# Patient Record
Sex: Male | Born: 1940 | Race: White | Hispanic: No | Smoking: Former smoker
Health system: Southern US, Community
[De-identification: ages and names within clinical notes are randomized; demographics above are authoritative.]

## PROBLEM LIST (undated history)

## (undated) DIAGNOSIS — T4145XA Adverse effect of unspecified anesthetic, initial encounter: Secondary | ICD-10-CM

## (undated) DIAGNOSIS — N402 Nodular prostate without lower urinary tract symptoms: Secondary | ICD-10-CM

## (undated) DIAGNOSIS — J45909 Unspecified asthma, uncomplicated: Secondary | ICD-10-CM

## (undated) DIAGNOSIS — N400 Enlarged prostate without lower urinary tract symptoms: Secondary | ICD-10-CM

## (undated) DIAGNOSIS — I959 Hypotension, unspecified: Secondary | ICD-10-CM

## (undated) DIAGNOSIS — T8859XA Other complications of anesthesia, initial encounter: Secondary | ICD-10-CM

## (undated) DIAGNOSIS — Z9981 Dependence on supplemental oxygen: Secondary | ICD-10-CM

## (undated) DIAGNOSIS — M1990 Unspecified osteoarthritis, unspecified site: Secondary | ICD-10-CM

## (undated) DIAGNOSIS — J449 Chronic obstructive pulmonary disease, unspecified: Secondary | ICD-10-CM

## (undated) DIAGNOSIS — R0602 Shortness of breath: Secondary | ICD-10-CM

## (undated) DIAGNOSIS — I4891 Unspecified atrial fibrillation: Principal | ICD-10-CM

## (undated) DIAGNOSIS — I10 Essential (primary) hypertension: Secondary | ICD-10-CM

## (undated) DIAGNOSIS — G5730 Lesion of lateral popliteal nerve, unspecified lower limb: Secondary | ICD-10-CM

## (undated) DIAGNOSIS — G709 Myoneural disorder, unspecified: Secondary | ICD-10-CM

## (undated) HISTORY — DX: Chronic obstructive pulmonary disease, unspecified: J44.9

## (undated) HISTORY — DX: Nodular prostate without lower urinary tract symptoms: N40.2

## (undated) HISTORY — DX: Unspecified atrial fibrillation: I48.91

## (undated) HISTORY — DX: Unspecified asthma, uncomplicated: J45.909

## (undated) HISTORY — DX: Benign prostatic hyperplasia without lower urinary tract symptoms: N40.0

## (undated) HISTORY — DX: Essential (primary) hypertension: I10

## (undated) HISTORY — PX: TONSILLECTOMY: SHX5217

## (undated) HISTORY — DX: Hypotension, unspecified: I95.9

## (undated) HISTORY — PX: COLONOSCOPY: SHX174

---

## 2004-10-18 ENCOUNTER — Ambulatory Visit: Payer: Self-pay | Admitting: Internal Medicine

## 2005-07-10 ENCOUNTER — Encounter: Payer: Self-pay | Admitting: *Deleted

## 2005-07-11 ENCOUNTER — Ambulatory Visit: Payer: Self-pay | Admitting: Internal Medicine

## 2005-07-11 ENCOUNTER — Inpatient Hospital Stay (HOSPITAL_COMMUNITY): Admission: AD | Admit: 2005-07-11 | Discharge: 2005-07-18 | Payer: Self-pay | Admitting: Internal Medicine

## 2005-07-17 ENCOUNTER — Encounter: Payer: Self-pay | Admitting: Cardiology

## 2005-07-17 ENCOUNTER — Ambulatory Visit: Payer: Self-pay | Admitting: Cardiology

## 2005-07-17 ENCOUNTER — Ambulatory Visit: Payer: Self-pay | Admitting: Internal Medicine

## 2005-07-20 ENCOUNTER — Ambulatory Visit: Payer: Self-pay | Admitting: Cardiology

## 2005-07-23 ENCOUNTER — Ambulatory Visit: Payer: Self-pay | Admitting: Internal Medicine

## 2005-07-27 ENCOUNTER — Ambulatory Visit: Payer: Self-pay | Admitting: Cardiology

## 2005-08-02 ENCOUNTER — Ambulatory Visit: Payer: Self-pay | Admitting: Internal Medicine

## 2005-08-10 ENCOUNTER — Ambulatory Visit: Payer: Self-pay | Admitting: Internal Medicine

## 2005-08-17 ENCOUNTER — Ambulatory Visit: Payer: Self-pay | Admitting: Cardiovascular Disease

## 2005-08-24 ENCOUNTER — Ambulatory Visit: Payer: Self-pay | Admitting: Cardiology

## 2005-09-03 ENCOUNTER — Ambulatory Visit: Payer: Self-pay | Admitting: Cardiology

## 2005-09-17 LAB — HM COLONOSCOPY

## 2005-10-01 ENCOUNTER — Ambulatory Visit: Payer: Self-pay | Admitting: Cardiology

## 2005-10-08 ENCOUNTER — Ambulatory Visit: Payer: Self-pay | Admitting: Internal Medicine

## 2005-10-15 ENCOUNTER — Ambulatory Visit: Payer: Self-pay | Admitting: Cardiology

## 2005-10-22 ENCOUNTER — Ambulatory Visit: Payer: Self-pay | Admitting: Internal Medicine

## 2005-10-24 ENCOUNTER — Ambulatory Visit: Payer: Self-pay | Admitting: Internal Medicine

## 2005-11-09 ENCOUNTER — Ambulatory Visit: Payer: Self-pay | Admitting: Internal Medicine

## 2005-11-23 ENCOUNTER — Ambulatory Visit: Payer: Self-pay | Admitting: Internal Medicine

## 2005-12-07 ENCOUNTER — Ambulatory Visit: Payer: Self-pay | Admitting: Cardiovascular Disease

## 2005-12-24 ENCOUNTER — Ambulatory Visit: Payer: Self-pay | Admitting: Cardiology

## 2006-01-07 ENCOUNTER — Ambulatory Visit: Payer: Self-pay | Admitting: Internal Medicine

## 2006-01-23 ENCOUNTER — Ambulatory Visit: Payer: Self-pay | Admitting: Internal Medicine

## 2006-02-04 ENCOUNTER — Ambulatory Visit: Payer: Self-pay | Admitting: Internal Medicine

## 2006-03-04 ENCOUNTER — Ambulatory Visit: Payer: Self-pay | Admitting: Cardiology

## 2006-04-01 ENCOUNTER — Ambulatory Visit: Payer: Self-pay | Admitting: Cardiology

## 2006-04-29 ENCOUNTER — Ambulatory Visit: Payer: Self-pay | Admitting: Cardiovascular Disease

## 2006-05-27 ENCOUNTER — Ambulatory Visit: Payer: Self-pay | Admitting: *Deleted

## 2006-05-29 ENCOUNTER — Ambulatory Visit: Payer: Self-pay | Admitting: Internal Medicine

## 2006-06-12 ENCOUNTER — Ambulatory Visit: Payer: Self-pay | Admitting: Internal Medicine

## 2006-06-24 ENCOUNTER — Ambulatory Visit: Payer: Self-pay | Admitting: Cardiovascular Disease

## 2006-08-22 ENCOUNTER — Ambulatory Visit: Payer: Self-pay | Admitting: Cardiovascular Disease

## 2006-09-23 ENCOUNTER — Ambulatory Visit: Payer: Self-pay | Admitting: Cardiology

## 2006-09-24 ENCOUNTER — Ambulatory Visit: Payer: Self-pay | Admitting: Internal Medicine

## 2007-01-22 ENCOUNTER — Ambulatory Visit: Payer: Self-pay | Admitting: Internal Medicine

## 2007-01-22 LAB — CONVERTED CEMR LAB
ALT: 19 units/L (ref 0–40)
AST: 20 units/L (ref 0–37)
Albumin: 4.1 g/dL (ref 3.5–5.2)
Alkaline Phosphatase: 59 units/L (ref 39–117)
BUN: 20 mg/dL (ref 6–23)
Basophils Absolute: 0 10*3/uL (ref 0.0–0.1)
Basophils Relative: 0.2 % (ref 0.0–1.0)
Bilirubin, Direct: 0.1 mg/dL (ref 0.0–0.3)
CO2: 28 meq/L (ref 19–32)
Calcium: 9.5 mg/dL (ref 8.4–10.5)
Chloride: 108 meq/L (ref 96–112)
Cholesterol: 202 mg/dL (ref 0–200)
Creatinine, Ser: 1.5 mg/dL (ref 0.4–1.5)
Direct LDL: 109.2 mg/dL
Eosinophils Absolute: 0.2 10*3/uL (ref 0.0–0.6)
Eosinophils Relative: 1.5 % (ref 0.0–5.0)
GFR calc Af Amer: 60 mL/min
GFR calc non Af Amer: 50 mL/min
Glucose, Bld: 107 mg/dL — ABNORMAL HIGH (ref 70–99)
HCT: 47.8 % (ref 39.0–52.0)
HDL: 63.2 mg/dL (ref >39.0)
Hemoglobin: 15.9 g/dL (ref 13.0–17.0)
Lymphocytes Relative: 16 % (ref 12.0–46.0)
MCHC: 33.2 g/dL (ref 30.0–36.0)
MCV: 98.1 fL (ref 78.0–100.0)
Monocytes Absolute: 1.2 10*3/uL — ABNORMAL HIGH (ref 0.2–0.7)
Monocytes Relative: 9.9 % (ref 3.0–11.0)
Neutro Abs: 8.6 10*3/uL — ABNORMAL HIGH (ref 1.4–7.7)
Neutrophils Relative %: 72.4 % (ref 43.0–77.0)
PSA: 1.51 ng/mL (ref 0.10–4.00)
Platelets: 390 10*3/uL (ref 150–400)
Potassium: 4.7 meq/L (ref 3.5–5.1)
RBC: 4.87 M/uL (ref 4.22–5.81)
RDW: 13.1 % (ref 11.5–14.6)
Sodium: 142 meq/L (ref 135–145)
Total Bilirubin: 1.2 mg/dL (ref 0.3–1.2)
Total CHOL/HDL Ratio: 3.2
Total Protein: 6.7 g/dL (ref 6.0–8.3)
Triglycerides: 133 mg/dL (ref 0–149)
VLDL: 27 mg/dL (ref 0–40)
WBC: 11.9 10*3/uL — ABNORMAL HIGH (ref 4.5–10.5)

## 2007-04-22 DIAGNOSIS — J45909 Unspecified asthma, uncomplicated: Secondary | ICD-10-CM

## 2007-04-22 DIAGNOSIS — I10 Essential (primary) hypertension: Secondary | ICD-10-CM

## 2007-04-22 HISTORY — DX: Essential (primary) hypertension: I10

## 2007-04-22 HISTORY — DX: Unspecified asthma, uncomplicated: J45.909

## 2007-05-20 ENCOUNTER — Ambulatory Visit: Payer: Self-pay | Admitting: Internal Medicine

## 2007-05-20 LAB — CONVERTED CEMR LAB
ALT: 29 units/L (ref 0–53)
AST: 32 units/L (ref 0–37)
Albumin: 3.8 g/dL (ref 3.5–5.2)
Alkaline Phosphatase: 66 units/L (ref 39–117)
BUN: 17 mg/dL (ref 6–23)
Basophils Absolute: 0 10*3/uL (ref 0.0–0.1)
Basophils Relative: 0.1 % (ref 0.0–1.0)
Bilirubin Urine: NEGATIVE
Bilirubin, Direct: 0.1 mg/dL (ref 0.0–0.3)
Blood in Urine, dipstick: NEGATIVE
CO2: 27 meq/L (ref 19–32)
Calcium: 9.4 mg/dL (ref 8.4–10.5)
Chloride: 103 meq/L (ref 96–112)
Cholesterol: 188 mg/dL (ref 0–200)
Creatinine, Ser: 1.6 mg/dL — ABNORMAL HIGH (ref 0.4–1.5)
Eosinophils Absolute: 0.2 10*3/uL (ref 0.0–0.6)
Eosinophils Relative: 1.9 % (ref 0.0–5.0)
GFR calc Af Amer: 56 mL/min
GFR calc non Af Amer: 46 mL/min
Glucose, Bld: 126 mg/dL — ABNORMAL HIGH (ref 70–99)
Glucose, Urine, Semiquant: NEGATIVE
HCT: 46 % (ref 39.0–52.0)
HDL: 46.2 mg/dL (ref >39.0)
Hemoglobin: 15.9 g/dL (ref 13.0–17.0)
Ketones, urine, test strip: NEGATIVE
LDL Cholesterol: 121 mg/dL — ABNORMAL HIGH (ref 0–99)
Lymphocytes Relative: 14.8 % (ref 12.0–46.0)
MCHC: 34.7 g/dL (ref 30.0–36.0)
MCV: 99 fL (ref 78.0–100.0)
Monocytes Absolute: 1.1 10*3/uL — ABNORMAL HIGH (ref 0.2–0.7)
Monocytes Relative: 9.1 % (ref 3.0–11.0)
Neutro Abs: 8.9 10*3/uL — ABNORMAL HIGH (ref 1.4–7.7)
Neutrophils Relative %: 74.1 % (ref 43.0–77.0)
Nitrite: NEGATIVE
PSA: 1.47 ng/mL (ref 0.10–4.00)
Platelets: 392 10*3/uL (ref 150–400)
Potassium: 4.5 meq/L (ref 3.5–5.1)
Protein, U semiquant: NEGATIVE
RBC: 4.65 M/uL (ref 4.22–5.81)
RDW: 12.7 % (ref 11.5–14.6)
Sodium: 139 meq/L (ref 135–145)
Specific Gravity, Urine: 1.02
TSH: 1.15 microintl units/mL (ref 0.35–5.50)
Total Bilirubin: 0.8 mg/dL (ref 0.3–1.2)
Total CHOL/HDL Ratio: 4.1
Total Protein: 6.6 g/dL (ref 6.0–8.3)
Triglycerides: 102 mg/dL (ref 0–149)
Urobilinogen, UA: 0.2
VLDL: 20 mg/dL (ref 0–40)
WBC Urine, dipstick: NEGATIVE
WBC: 12 10*3/uL — ABNORMAL HIGH (ref 4.5–10.5)
pH: 5.5

## 2007-05-28 ENCOUNTER — Ambulatory Visit: Payer: Self-pay | Admitting: Internal Medicine

## 2007-05-28 DIAGNOSIS — J4489 Other specified chronic obstructive pulmonary disease: Secondary | ICD-10-CM

## 2007-05-28 DIAGNOSIS — N402 Nodular prostate without lower urinary tract symptoms: Secondary | ICD-10-CM | POA: Insufficient documentation

## 2007-05-28 DIAGNOSIS — I4891 Unspecified atrial fibrillation: Secondary | ICD-10-CM

## 2007-05-28 DIAGNOSIS — J449 Chronic obstructive pulmonary disease, unspecified: Secondary | ICD-10-CM

## 2007-05-28 HISTORY — DX: Unspecified atrial fibrillation: I48.91

## 2007-05-28 HISTORY — DX: Other specified chronic obstructive pulmonary disease: J44.89

## 2007-05-28 HISTORY — DX: Nodular prostate without lower urinary tract symptoms: N40.2

## 2007-05-28 HISTORY — DX: Chronic obstructive pulmonary disease, unspecified: J44.9

## 2007-05-29 DIAGNOSIS — I480 Paroxysmal atrial fibrillation: Secondary | ICD-10-CM

## 2007-06-04 ENCOUNTER — Telehealth (INDEPENDENT_AMBULATORY_CARE_PROVIDER_SITE_OTHER): Payer: Self-pay | Admitting: *Deleted

## 2007-06-19 ENCOUNTER — Ambulatory Visit: Payer: Self-pay | Admitting: Gastroenterology

## 2007-07-03 ENCOUNTER — Ambulatory Visit: Payer: Self-pay | Admitting: Gastroenterology

## 2007-07-03 ENCOUNTER — Encounter: Payer: Self-pay | Admitting: Gastroenterology

## 2007-07-03 ENCOUNTER — Encounter: Payer: Self-pay | Admitting: Internal Medicine

## 2007-11-27 ENCOUNTER — Ambulatory Visit: Payer: Self-pay | Admitting: Internal Medicine

## 2008-03-02 ENCOUNTER — Ambulatory Visit: Payer: Self-pay | Admitting: Internal Medicine

## 2008-04-20 ENCOUNTER — Ambulatory Visit: Payer: Self-pay | Admitting: Internal Medicine

## 2008-04-20 DIAGNOSIS — N400 Enlarged prostate without lower urinary tract symptoms: Secondary | ICD-10-CM

## 2008-04-20 HISTORY — DX: Benign prostatic hyperplasia without lower urinary tract symptoms: N40.0

## 2008-06-18 ENCOUNTER — Ambulatory Visit: Payer: Self-pay | Admitting: Internal Medicine

## 2008-06-18 LAB — CONVERTED CEMR LAB
ALT: 16 units/L (ref 0–53)
AST: 17 units/L (ref 0–37)
Albumin: 3.9 g/dL (ref 3.5–5.2)
Alkaline Phosphatase: 67 units/L (ref 39–117)
BUN: 19 mg/dL (ref 6–23)
Basophils Absolute: 0 10*3/uL (ref 0.0–0.1)
Basophils Relative: 0.1 % (ref 0.0–3.0)
Bilirubin Urine: NEGATIVE
Bilirubin, Direct: 0.2 mg/dL (ref 0.0–0.3)
Blood in Urine, dipstick: NEGATIVE
CO2: 26 meq/L (ref 19–32)
Calcium: 9.1 mg/dL (ref 8.4–10.5)
Chloride: 101 meq/L (ref 96–112)
Cholesterol: 200 mg/dL (ref 0–200)
Creatinine, Ser: 1.5 mg/dL (ref 0.4–1.5)
Eosinophils Absolute: 0.1 10*3/uL (ref 0.0–0.7)
Eosinophils Relative: 1.5 % (ref 0.0–5.0)
GFR calc Af Amer: 60 mL/min
GFR calc non Af Amer: 49 mL/min
Glucose, Bld: 103 mg/dL — ABNORMAL HIGH (ref 70–99)
Glucose, Urine, Semiquant: NEGATIVE
HCT: 47.4 % (ref 39.0–52.0)
HDL: 93.7 mg/dL (ref >39.0)
Hemoglobin: 15.9 g/dL (ref 13.0–17.0)
Ketones, urine, test strip: NEGATIVE
LDL Cholesterol: 95 mg/dL (ref 0–99)
Lymphocytes Relative: 15.2 % (ref 12.0–46.0)
MCHC: 33.5 g/dL (ref 30.0–36.0)
MCV: 98.9 fL (ref 78.0–100.0)
Monocytes Absolute: 0.9 10*3/uL (ref 0.1–1.0)
Monocytes Relative: 9.4 % (ref 3.0–12.0)
Neutro Abs: 6.8 10*3/uL (ref 1.4–7.7)
Neutrophils Relative %: 73.8 % (ref 43.0–77.0)
Nitrite: NEGATIVE
PSA: 1.37 ng/mL (ref 0.10–4.00)
Platelets: 351 10*3/uL (ref 150–400)
Potassium: 4.5 meq/L (ref 3.5–5.1)
Protein, U semiquant: NEGATIVE
RBC: 4.79 M/uL (ref 4.22–5.81)
RDW: 13.3 % (ref 11.5–14.6)
Sodium: 136 meq/L (ref 135–145)
Specific Gravity, Urine: 1.015
TSH: 1.91 microintl units/mL (ref 0.35–5.50)
Total Bilirubin: 0.8 mg/dL (ref 0.3–1.2)
Total CHOL/HDL Ratio: 2.1
Total Protein: 6.7 g/dL (ref 6.0–8.3)
Triglycerides: 56 mg/dL (ref 0–149)
Urobilinogen, UA: 0.2
VLDL: 11 mg/dL (ref 0–40)
WBC Urine, dipstick: NEGATIVE
WBC: 9.2 10*3/uL (ref 4.5–10.5)
pH: 5.5

## 2008-06-24 ENCOUNTER — Encounter: Payer: Self-pay | Admitting: Internal Medicine

## 2008-06-25 ENCOUNTER — Ambulatory Visit: Payer: Self-pay | Admitting: Internal Medicine

## 2008-12-15 ENCOUNTER — Ambulatory Visit: Payer: Self-pay | Admitting: Internal Medicine

## 2009-01-18 ENCOUNTER — Telehealth: Payer: Self-pay | Admitting: Internal Medicine

## 2009-06-15 ENCOUNTER — Ambulatory Visit: Payer: Self-pay | Admitting: Internal Medicine

## 2009-10-12 ENCOUNTER — Ambulatory Visit: Payer: Self-pay | Admitting: Internal Medicine

## 2009-10-13 ENCOUNTER — Encounter: Payer: Self-pay | Admitting: Internal Medicine

## 2009-11-24 ENCOUNTER — Telehealth: Payer: Self-pay | Admitting: Internal Medicine

## 2010-02-08 ENCOUNTER — Ambulatory Visit: Payer: Self-pay | Admitting: Internal Medicine

## 2010-08-08 ENCOUNTER — Ambulatory Visit: Payer: Self-pay | Admitting: Internal Medicine

## 2010-09-23 ENCOUNTER — Emergency Department (HOSPITAL_COMMUNITY)
Admission: EM | Admit: 2010-09-23 | Discharge: 2010-09-23 | Payer: Self-pay | Source: Home / Self Care | Admitting: Emergency Medicine

## 2010-09-26 ENCOUNTER — Ambulatory Visit
Admission: RE | Admit: 2010-09-26 | Discharge: 2010-09-26 | Payer: Self-pay | Source: Home / Self Care | Attending: Internal Medicine | Admitting: Internal Medicine

## 2010-10-02 LAB — DIFFERENTIAL
Basophils Absolute: 0 10*3/uL (ref 0.0–0.1)
Basophils Relative: 0 % (ref 0–1)
Eosinophils Absolute: 0.1 10*3/uL (ref 0.0–0.7)
Eosinophils Relative: 0 % (ref 0–5)
Lymphocytes Relative: 3 % — ABNORMAL LOW (ref 12–46)
Lymphs Abs: 0.6 10*3/uL — ABNORMAL LOW (ref 0.7–4.0)
Monocytes Absolute: 1.9 10*3/uL — ABNORMAL HIGH (ref 0.1–1.0)
Monocytes Relative: 11 % (ref 3–12)
Neutro Abs: 15.4 10*3/uL — ABNORMAL HIGH (ref 1.7–7.7)
Neutrophils Relative %: 86 % — ABNORMAL HIGH (ref 43–77)

## 2010-10-02 LAB — BASIC METABOLIC PANEL
BUN: 19 mg/dL (ref 6–23)
CO2: 24 mEq/L (ref 19–32)
Calcium: 9.6 mg/dL (ref 8.4–10.5)
Chloride: 103 mEq/L (ref 96–112)
Creatinine, Ser: 1.92 mg/dL — ABNORMAL HIGH (ref 0.4–1.5)
GFR calc Af Amer: 42 mL/min — ABNORMAL LOW (ref >60)
GFR calc non Af Amer: 35 mL/min — ABNORMAL LOW (ref >60)
Glucose, Bld: 123 mg/dL — ABNORMAL HIGH (ref 70–99)
Potassium: 5 mEq/L (ref 3.5–5.1)
Sodium: 138 mEq/L (ref 135–145)

## 2010-10-02 LAB — BLOOD GAS, ARTERIAL
Acid-base deficit: 3.9 mmol/L — ABNORMAL HIGH (ref 0.0–2.0)
Bicarbonate: 19.8 mEq/L — ABNORMAL LOW (ref 20.0–24.0)
Drawn by: 307971
FIO2: 0.21 %
O2 Saturation: 93.1 %
Patient temperature: 98.6
TCO2: 17.1 mmol/L (ref 0–100)
pCO2 arterial: 34.2 mmHg — ABNORMAL LOW (ref 35.0–45.0)
pH, Arterial: 7.381 (ref 7.350–7.450)
pO2, Arterial: 64.3 mmHg — ABNORMAL LOW (ref 80.0–100.0)

## 2010-10-02 LAB — CBC
HCT: 48.4 % (ref 39.0–52.0)
Hemoglobin: 16.3 g/dL (ref 13.0–17.0)
MCH: 33.3 pg (ref 26.0–34.0)
MCHC: 33.7 g/dL (ref 30.0–36.0)
MCV: 99 fL (ref 78.0–100.0)
Platelets: 368 10*3/uL (ref 150–400)
RBC: 4.89 MIL/uL (ref 4.22–5.81)
RDW: 13 % (ref 11.5–15.5)
WBC: 17.9 10*3/uL — ABNORMAL HIGH (ref 4.0–10.5)

## 2010-10-02 LAB — POCT CARDIAC MARKERS
CKMB, poc: 1.9 ng/mL (ref 1.0–8.0)
Myoglobin, poc: 308 ng/mL (ref 12–200)
Troponin i, poc: <0.05 ng/mL (ref 0.00–0.09)

## 2010-10-02 LAB — BRAIN NATRIURETIC PEPTIDE: Pro B Natriuretic peptide (BNP): <30 pg/mL (ref 0.0–100.0)

## 2010-10-10 ENCOUNTER — Encounter: Payer: Self-pay | Admitting: Internal Medicine

## 2010-10-10 ENCOUNTER — Ambulatory Visit
Admission: RE | Admit: 2010-10-10 | Discharge: 2010-10-10 | Payer: Self-pay | Source: Home / Self Care | Attending: Internal Medicine | Admitting: Internal Medicine

## 2010-10-10 DIAGNOSIS — I959 Hypotension, unspecified: Secondary | ICD-10-CM

## 2010-10-10 HISTORY — DX: Hypotension, unspecified: I95.9

## 2010-10-13 ENCOUNTER — Ambulatory Visit
Admission: RE | Admit: 2010-10-13 | Discharge: 2010-10-13 | Payer: Self-pay | Source: Home / Self Care | Attending: Internal Medicine | Admitting: Internal Medicine

## 2010-10-15 LAB — CONVERTED CEMR LAB
ALT: 20 units/L (ref 0–53)
AST: 23 units/L (ref 0–37)
Albumin: 4 g/dL (ref 3.5–5.2)
Alkaline Phosphatase: 51 units/L (ref 39–117)
BUN: 21 mg/dL (ref 6–23)
Basophils Absolute: 0 10*3/uL (ref 0.0–0.1)
Basophils Relative: 0.3 % (ref 0.0–3.0)
Bilirubin, Direct: 0.1 mg/dL (ref 0.0–0.3)
CO2: 27 meq/L (ref 19–32)
Calcium: 9.5 mg/dL (ref 8.4–10.5)
Chloride: 103 meq/L (ref 96–112)
Cholesterol: 195 mg/dL (ref 0–200)
Creatinine, Ser: 1.6 mg/dL — ABNORMAL HIGH (ref 0.4–1.5)
Eosinophils Absolute: 0.3 10*3/uL (ref 0.0–0.7)
Eosinophils Relative: 3.3 % (ref 0.0–5.0)
GFR calc non Af Amer: 45.71 mL/min (ref >60)
Glucose, Bld: 113 mg/dL — ABNORMAL HIGH (ref 70–99)
HCT: 47 % (ref 39.0–52.0)
HDL: 104.2 mg/dL (ref >39.00)
Hemoglobin: 15.4 g/dL (ref 13.0–17.0)
LDL Cholesterol: 81 mg/dL (ref 0–99)
Lymphocytes Relative: 15.3 % (ref 12.0–46.0)
Lymphs Abs: 1.4 10*3/uL (ref 0.7–4.0)
MCHC: 32.7 g/dL (ref 30.0–36.0)
MCV: 100.6 fL — ABNORMAL HIGH (ref 78.0–100.0)
Monocytes Absolute: 1 10*3/uL (ref 0.1–1.0)
Monocytes Relative: 11.3 % (ref 3.0–12.0)
Neutro Abs: 6.3 10*3/uL (ref 1.4–7.7)
Neutrophils Relative %: 69.8 % (ref 43.0–77.0)
PSA: 2.04 ng/mL (ref 0.10–4.00)
Platelets: 336 10*3/uL (ref 150.0–400.0)
Potassium: 4.5 meq/L (ref 3.5–5.1)
RBC: 4.68 M/uL (ref 4.22–5.81)
RDW: 13.7 % (ref 11.5–14.6)
Sodium: 140 meq/L (ref 135–145)
TSH: 1.98 microintl units/mL (ref 0.35–5.50)
Total Bilirubin: 0.8 mg/dL (ref 0.3–1.2)
Total CHOL/HDL Ratio: 2
Total Protein: 6.8 g/dL (ref 6.0–8.3)
Triglycerides: 48 mg/dL (ref 0.0–149.0)
VLDL: 9.6 mg/dL (ref 0.0–40.0)
WBC: 9 10*3/uL (ref 4.5–10.5)

## 2010-10-17 NOTE — Assessment & Plan Note (Signed)
Summary: EMP-WILL FAST//CCM   Vital Signs:  Patient profile:   70 year old male Weight:      140 pounds BMI:     24.12 O2 Sat:      91 % on Room air Pulse rate:   96 / minute Pulse rhythm:   regular BP sitting:   120 / 76  (left arm) Cuff size:   regular  Vitals Entered By: Raechel Ache, RN (October 12, 2009 8:43 AM)  O2 Flow:  Room air CC: OV, fasting. C/o SOB, congestion.   CC:  OV, fasting. C/o SOB, and congestion.Marland Kitchen  History of Present Illness: 70 year old patient who is seen today for a comprehensive evaluation.  he has a history of advanced COPD, which has been fairly stable.  For the past few days, he has noticed an increasing cough and congestion is slightly worsened dyspnea on exertion.  He has treated hypertension, history of BPH and also a history of paroxysmal atrial fibrillation. He is on maintenance Advair, as well as home nebulizer treatments with Xopenex.  No new concerns or complaints.  In general, his pulmonary status has been stable  Allergies: 1)  ! Penicillin V Potassium (Penicillin V Potassium)  Past History:  Past Medical History: Reviewed history from 04/20/2008 and no changes required. Asthma Hypertension COPD Atrial fibrillation Benign prostatic hypertrophy  Past Surgical History: Reviewed history from 06/25/2008 and no changes required. Tonsillectomy sigmoidoscopy 2002 colonoscopy 2007  Family History: Reviewed history from 05/28/2007 and no changes required. father died age 61 mother died age 50 apparently, an autoimmune  disorder one sister, health unknown  Social History: Reviewed history from 05/28/2007 and no changes required. wife deceased from colon cancer  Review of Systems       The patient complains of dyspnea on exertion.  The patient denies anorexia, fever, weight loss, weight gain, vision loss, decreased hearing, hoarseness, chest pain, syncope, peripheral edema, prolonged cough, headaches, hemoptysis, abdominal pain,  melena, hematochezia, severe indigestion/heartburn, hematuria, incontinence, genital sores, muscle weakness, suspicious skin lesions, transient blindness, difficulty walking, depression, unusual weight change, abnormal bleeding, enlarged lymph nodes, angioedema, breast masses, and testicular masses.    Physical Exam  General:  Well-developed,well-nourished,in no acute distress; alert,appropriate and cooperative throughout examination; 120/72 Head:  Normocephalic and atraumatic without obvious abnormalities. No apparent alopecia or balding. Eyes:  No corneal or conjunctival inflammation noted. EOMI. Perrla. Funduscopic exam benign, without hemorrhages, exudates or papilledema. Vision grossly normal. Ears:  External ear exam shows no significant lesions or deformities.  Otoscopic examination reveals clear canals, tympanic membranes are intact bilaterally without bulging, retraction, inflammation or discharge. Hearing is grossly normal bilaterally. Nose:  External nasal examination shows no deformity or inflammation. Nasal mucosa are pink and moist without lesions or exudates. Mouth:  Oral mucosa and oropharynx without lesions or exudates.  Teeth in good repair. Neck:  No deformities, masses, or tenderness noted. Chest Wall:  No deformities, masses, tenderness or gynecomastia noted. Breasts:  No masses or gynecomastia noted Lungs:  normal respiratory effort, no intercostal retractions, and no accessory muscle use.   scattered rhonchi, few  faint  wheezesnormal respiratory effort, no intercostal retractions, and no accessory muscle use.   Heart:  Normal rate and regular rhythm. S1 and S2 normal without gallop, murmur, click, rub or other extra sounds. Abdomen:  Bowel sounds positive,abdomen soft and non-tender without masses, organomegaly or hernias noted. Rectal:  No external abnormalities noted. Normal sphincter tone. No rectal masses or tenderness. Genitalia:  uncircumcised.  right inguinal  herniauncircumcised.   Prostate:  2+ enlarged.  2+ enlarged.   Msk:  No deformity or scoliosis noted of thoracic or lumbar spine.   Pulses:  pedal pulses decreased on the left.  No ischemic changes. Extremities:  No clubbing, cyanosis, edema, or deformity noted with normal full range of motion of all joints.   Neurologic:  alert & oriented X3, cranial nerves II-XII intact, strength normal in all extremities, sensation intact to light touch, gait normal, and DTRs symmetrical and normal.  alert & oriented X3, cranial nerves II-XII intact, strength normal in all extremities, sensation intact to light touch, gait normal, and DTRs symmetrical and normal.   Skin:  Intact without suspicious lesions or rashes Cervical Nodes:  No lymphadenopathy noted Axillary Nodes:  No palpable lymphadenopathy Inguinal Nodes:  No significant adenopathy Psych:  Cognition and judgment appear intact. Alert and cooperative with normal attention span and concentration. No apparent delusions, illusions, hallucinations   Impression & Recommendations:  Problem # 1:  BENIGN PROSTATIC HYPERTROPHY (ICD-600.00)  Orders: Venipuncture (47829) TLB-Lipid Panel (80061-LIPID) TLB-BMP (Basic Metabolic Panel-BMET) (80048-METABOL) TLB-CBC Platelet - w/Differential (85025-CBCD) TLB-Hepatic/Liver Function Pnl (80076-HEPATIC) TLB-TSH (Thyroid Stimulating Hormone) (84443-TSH) TLB-PSA (Prostate Specific Antigen) (84153-PSA)  Problem # 2:  ATRIAL FIBRILLATION (ICD-427.31)  His updated medication list for this problem includes:    Diltiazem Hcl Er Beads 240 Mg Cp24 (Diltiazem hcl er beads) ..... One  capsule by mouth once a day  Orders: EKG w/ Interpretation (93000)  His updated medication list for this problem includes:    Diltiazem Hcl Er Beads 240 Mg Cp24 (Diltiazem hcl er beads) .Marland Kitchen... 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 One  capsule by mouth once a day  Orders: EKG w/ Interpretation (93000) Venipuncture (56213) TLB-Lipid Panel (80061-LIPID) TLB-BMP (Basic Metabolic Panel-BMET) (80048-METABOL) TLB-CBC Platelet - w/Differential (85025-CBCD) TLB-Hepatic/Liver Function Pnl (80076-HEPATIC) TLB-TSH (Thyroid Stimulating Hormone) (84443-TSH)  His updated medication list for this problem includes:    Diltiazem Hcl Er Beads 240 Mg Cp24 (Diltiazem hcl er beads) .Marland Kitchen... Take 2 capsule by mouth once a day  Problem # 5:  HYPERTENSION (ICD-401.9)  His updated medication list for this problem includes:    Zestril 20 Mg Tabs (Lisinopril) .Marland Kitchen... Take 1 tablet by mouth once a day    Diltiazem Hcl Er Beads 240 Mg Cp24 (Diltiazem hcl er beads) ..... One  capsule by mouth once a day    Hydrochlorothiazide 25 Mg Tabs (Hydrochlorothiazide) .Marland Kitchen... Take 1 tablet by mouth once a day  His updated medication list for this problem includes:    Zestril 20 Mg Tabs (Lisinopril) .Marland Kitchen... Take 1 tablet by mouth once a day    Diltiazem Hcl Er Beads 240 Mg Cp24 (Diltiazem hcl er beads) .Marland Kitchen... Take 2 capsule by mouth once a day    Hydrochlorothiazide 25 Mg Tabs (Hydrochlorothiazide) .Marland Kitchen... Take 1 tablet by mouth once a day  Orders: Venipuncture (08657) TLB-Lipid Panel (80061-LIPID) TLB-BMP (Basic Metabolic Panel-BMET) (80048-METABOL) TLB-CBC Platelet - w/Differential (85025-CBCD) TLB-Hepatic/Liver Function Pnl (80076-HEPATIC) TLB-TSH (Thyroid Stimulating Hormone) (84696-EXB) Prescription Created Electronically [phone])  Complete Medication List: 1)  Zestril 20 Mg Tabs (Lisinopril) .... Take 1 tablet by mouth once a day 2)  Multivitamins Caps (Multiple vitamin) .... Once daily 3)  Diltiazem Hcl  Er Beads 240 Mg Cp24 (Diltiazem hcl er beads) .... One  capsule by mouth once a day 4)  Xopenex Hfa  45 Mcg/act Aero (Levalbuterol tartrate) .... Two inhalations every 6 hours 5)  Hydrochlorothiazide 25 Mg Tabs (Hydrochlorothiazide) .... Take 1 tablet by mouth once a day 6)  Advair Diskus 250-50 Mcg/dose Misc (Fluticasone-salmeterol) .... Use twice daily 7)  Xopenex Concentrate 1.25 Mg/0.26ml Nebu (Levalbuterol hcl) .... Use four times daily 8)  Xyzal 5 Mg Tabs (Levocetirizine dihydrochloride) .... One daily. 9)  Prednisone (pak) 5 Mg Tabs (Prednisone) .... As directed for 6 days  Other Orders: DRE (H6073)  Patient Instructions: 1)  Please schedule a follow-up appointment in 4 months. 2)  Limit your Sodium (Salt). 3)  It is important that you exercise regularly at least 20 minutes 5 times a week. If you develop chest pain, have severe difficulty breathing, or feel very tired , stop exercising immediately and seek medical attention. Prescriptions: PREDNISONE (PAK) 5 MG TABS (PREDNISONE) as directed for 6 days  #5 mg 6day x 0   Entered and Authorized by:   Gordy Savers  MD   Signed by:   Gordy Savers  MD on 10/12/2009   Method used:   Print then Give to Patient   RxID:   [id number] XYZAL 5 MG TABS (LEVOCETIRIZINE DIHYDROCHLORIDE) One daily.  #30 x 6   Entered and Authorized by:   Gordy Savers  MD   Signed by:   Gordy Savers  MD on 10/12/2009   Method used:   Print then Give to Patient   RxID:   [id number] CVELFYB CONCENTRATE 1.25 MG/0.5ML  NEBU (LEVALBUTEROL HCL) use four times daily  #120 x 6   Entered and Authorized by:   Gordy Savers  MD   Signed by:   Gordy Savers  MD on 10/12/2009   Method used:   Print then Give to Patient   RxID:   [id number] ADVAIR DISKUS 250-50 MCG/DOSE  MISC (FLUTICASONE-SALMETEROL) use twice daily  #3 x 6   Entered and Authorized by:   Gordy Savers  MD   Signed by:   Gordy Savers  MD on 10/12/2009   Method used:   Print then Give to Patient   RxID:    [id number] HYDROCHLOROTHIAZIDE 25 MG  TABS (HYDROCHLOROTHIAZIDE) Take 1 tablet by mouth once a day  #90 x 6   Entered and Authorized by:   Gordy Savers  MD   Signed by:   Gordy Savers  MD on 10/12/2009   Method used:   Print then Give to Patient   RxID:   [id number] KDXIPJA HFA 45 MCG/ACT  AERO (LEVALBUTEROL TARTRATE) two inhalations every 6 hours  #3 x 6   Entered and Authorized by:   Gordy Savers  MD   Signed by:   Gordy Savers  MD on 10/12/2009   Method used:   Print then Give to Patient   RxID:   [id number] DILTIAZEM HCL ER BEADS 240 MG  CP24 (DILTIAZEM HCL ER BEADS) one  capsule by mouth once a day  #90 x 6   Entered and Authorized by:   Gordy Savers  MD   Signed by:   Gordy Savers  MD on 10/12/2009   Method used:   Print then Give to Patient   RxID:   [id number] ZESTRIL 20 MG  TABS (LISINOPRIL) Take 1 tablet by mouth once a day  #90  x 6   Entered and Authorized by:   Gordy Savers  MD   Signed by:   Gordy Savers  MD on 10/12/2009   Method used:   Print then Give to Patient   RxID:   [id number] PREDNISONE (PAK) 5 MG TABS (PREDNISONE) as directed for 6 days  #5 mg 6day x 0   Entered and Authorized by:   Gordy Savers  MD   Signed by:   Gordy Savers  MD on 10/12/2009   Method used:   Electronically to        [facility]  [address] [facility]* (retail)       [address]       [place], [place]  [address]       Ph: [phone] or [phone]       Fax: [phone]   RxID:   [id number] XYZAL 5 MG TABS (LEVOCETIRIZINE DIHYDROCHLORIDE) One daily.  #30 x 6   Entered and Authorized by:   Gordy Savers  MD   Signed by:   Gordy Savers  MD on 10/12/2009   Method used:   Electronically to        [facility] [address] [facility]* (retail)       [address]       [place], [place]  [address]       Ph: [phone] or [phone]        Fax: [phone]   RxID:   [id number] SEGBTDV CONCENTRATE 1.25 MG/0.5ML  NEBU (LEVALBUTEROL HCL) use four times daily  #120 x 6   Entered and Authorized by:   Gordy Savers  MD   Signed by:   Gordy Savers  MD on 10/12/2009   Method used:   Electronically to        [facility]  [address] [facility]* (retail)       [address]       [place], [place]  [address]       Ph: [phone] or [phone]       Fax: [phone]   RxID:   [id number] ADVAIR DISKUS 250-50 MCG/DOSE  MISC (FLUTICASONE-SALMETEROL) use twice daily  #3 x 6   Entered and Authorized by:   Gordy Savers  MD   Signed by:   Gordy Savers  MD on 10/12/2009   Method used:   Electronically to        [facility]  [address] [facility]* (retail)       [address]       [place], [place]  [address]       Ph: [phone] or [phone]       Fax: [phone]   RxID:   [id number] HYDROCHLOROTHIAZIDE 25 MG  TABS (HYDROCHLOROTHIAZIDE) Take 1 tablet by mouth once a day  #90 x 6   Entered and Authorized by:   Gordy Savers  MD   Signed by:   Gordy Savers  MD on 10/12/2009   Method used:   Electronically to        [facility]  [address] [facility]* (retail)       [address]       [place], [place]  [address]  Ph: [phone] or [phone]       Fax: [phone]   RxID:   [id number] EPPIRJJ HFA 45 MCG/ACT  AERO (LEVALBUTEROL TARTRATE) two inhalations every 6 hours  #3 x 6   Entered and Authorized by:   Gordy Savers  MD   Signed by:   Gordy Savers  MD on 10/12/2009   Method used:   Electronically to        [facility]  [address] [facility] (retail)       [address]       [place], [place]  [address]       Ph: [phone] or [phone]       Fax: [phone]   RxID:   [id number] DILTIAZEM HCL ER BEADS 240 MG  CP24 (DILTIAZEM HCL ER BEADS) one  capsule by mouth once a day   #90 x 6   Entered and Authorized by:   Gordy Savers  MD   Signed by:   Gordy Savers  MD on 10/12/2009   Method used:   Electronically to        [facility]  [address] [facility]* (retail)       [address]       [place], [place]  [address]       Ph: [phone] or [phone]       Fax: [phone]   RxID:   [id number] ZESTRIL 20 MG  TABS (LISINOPRIL) Take 1 tablet by mouth once a day  #90 x 6   Entered and Authorized by:   Gordy Savers  MD   Signed by:   Gordy Savers  MD on 10/12/2009   Method used:   Electronically to        [facility]  [address] [facility]* (retail)       [address]       [place], [place]  [address]       Ph: [phone] or [phone]       Fax: [phone]   RxID:   [id number]

## 2010-10-17 NOTE — Assessment & Plan Note (Signed)
Summary: 4 month rov/njr   Vital Signs:  Patient profile:   70 year old male Weight:      143 pounds BP sitting:   118 / 70  (left arm) Cuff size:   regular  Vitals Entered By: Duard Brady LPN (Feb 08, 2010 8:30 AM) CC: 4 mos rov - doing well Is Patient Diabetic? No   CC:  4 mos rov - doing well.  History of Present Illness: a 70 year old patient who is seen today for follow-up.  He has a history of paroxysmal atrial for ablation hypertension, COPD, with asthma.  He has done quite well.  His pulmonate status has been stable.  He remains on multiple inhalational medications.  Has had a very uneventful spring as far as allergies and asthma.  He denies any cardiopulmonary complaints.  His only concern is some fullness in the right ear.  It sounds like he has a difficult time with equilibration  Preventive Screening-Counseling & Management  Alcohol-Tobacco     Smoking Status: quit  Allergies: 1)  ! Penicillin V Potassium (Penicillin V Potassium)  Past History:  Past Medical History: Reviewed history from 04/20/2008 and no changes required. Asthma Hypertension COPD Atrial fibrillation Benign prostatic hypertrophy  Past Surgical History: Reviewed history from 06/25/2008 and no changes required. Tonsillectomy sigmoidoscopy 2002 colonoscopy 2007  Family History: Reviewed history from 05/28/2007 and no changes required. father died age 37 mother died age 31 apparently, an autoimmune  disorder one sister, health unknown  Review of Systems  The patient denies anorexia, fever, weight loss, weight gain, vision loss, decreased hearing, hoarseness, chest pain, syncope, dyspnea on exertion, peripheral edema, prolonged cough, headaches, hemoptysis, abdominal pain, melena, hematochezia, severe indigestion/heartburn, hematuria, incontinence, genital sores, muscle weakness, suspicious skin lesions, transient blindness, difficulty walking, depression, unusual weight change,  abnormal bleeding, enlarged lymph nodes, angioedema, breast masses, and testicular masses.    Physical Exam  General:  Well-developed,well-nourished,in no acute distress; alert,appropriate and cooperative throughout examination; blood pressure low normal Head:  Normocephalic and atraumatic without obvious abnormalities. No apparent alopecia or balding. Eyes:  No corneal or conjunctival inflammation noted. EOMI. Perrla. Funduscopic exam benign, without hemorrhages, exudates or papilledema. Vision grossly normal. Mouth:  Oral mucosa and oropharynx without lesions or exudates.  Teeth in good repair. Neck:  No deformities, masses, or tenderness noted. Lungs:  Normal respiratory effort, chest expands symmetrically. Lungs are clear to auscultation, no crackles or wheezes. Heart:  Normal rate and regular rhythm. S1 and S2 normal without gallop, murmur, click, rub or other extra sounds. Abdomen:  Bowel sounds positive,abdomen soft and non-tender without masses, organomegaly or hernias noted.   Impression & Recommendations:  Problem # 1:  ATRIAL FIBRILLATION (ICD-427.31)  His updated medication list for this problem includes:    Diltiazem Hcl Er Beads 240 Mg Cp24 (Diltiazem hcl er beads) ..... One  capsule by mouth once a day  His updated medication list for this problem includes:    Diltiazem Hcl Er Beads 240 Mg Cp24 (Diltiazem hcl er beads) ..... One  capsule by mouth once a day  Problem # 2:  COPD (ICD-496)  His updated medication list for this problem includes:    Advair Diskus 250-50 Mcg/dose Misc (Fluticasone-salmeterol) ..... Use twice daily    Xopenex Concentrate 1.25 Mg/0.57ml Nebu (Levalbuterol hcl) ..... Use four times daily    Ventolin Hfa 108 (90 Base) Mcg/act Aers (Albuterol sulfate) .Marland Kitchen... 2 puffs q6h as needed  His updated medication list for this problem includes:  Advair Diskus 250-50 Mcg/dose Misc (Fluticasone-salmeterol) ..... Use twice daily    Xopenex Concentrate 1.25  Mg/0.49ml Nebu (Levalbuterol hcl) ..... Use four times daily    Ventolin Hfa 108 (90 Base) Mcg/act Aers (Albuterol sulfate) .Marland Kitchen... 2 puffs q6h as needed  Problem # 3:  HYPERTENSION (ICD-401.9)  His updated medication list for this problem includes:    Zestril 20 Mg Tabs (Lisinopril) .Marland Kitchen... Take 1 tablet by mouth once a day    Diltiazem Hcl Er Beads 240 Mg Cp24 (Diltiazem hcl er beads) ..... One  capsule by mouth once a day    Hydrochlorothiazide 25 Mg Tabs (Hydrochlorothiazide) .Marland Kitchen... Take 1 tablet by mouth once a day    His updated medication list for this problem includes:    Zestril 20 Mg Tabs (Lisinopril) .Marland Kitchen... Take 1 tablet by mouth once a day    Diltiazem Hcl Er Beads 240 Mg Cp24 (Diltiazem hcl er beads) ..... One  capsule by mouth once a day    Hydrochlorothiazide 25 Mg Tabs (Hydrochlorothiazide) .Marland Kitchen... Take 1 tablet by mouth once a day  Orders: Prescription Created Electronically [phone])  Complete Medication List: 1)  Zestril 20 Mg Tabs (Lisinopril) .... Take 1 tablet by mouth once a day 2)  Multivitamins Caps (Multiple vitamin) .... Once daily 3)  Diltiazem Hcl Er Beads 240 Mg Cp24 (Diltiazem hcl er beads) .... One  capsule by mouth once a day 4)  Hydrochlorothiazide 25 Mg Tabs (Hydrochlorothiazide) .... Take 1 tablet by mouth once a day 5)  Advair Diskus 250-50 Mcg/dose Misc (Fluticasone-salmeterol) .... Use twice daily 6)  Xopenex Concentrate 1.25 Mg/0.69ml Nebu (Levalbuterol hcl) .... Use four times daily 7)  Xyzal 5 Mg Tabs (Levocetirizine dihydrochloride) .... One daily. 8)  Prednisone (pak) 5 Mg Tabs (Prednisone) .... As directed for 6 days 9)  Ventolin Hfa 108 (90 Base) Mcg/act Aers (Albuterol sulfate) .... 2 puffs q6h as needed  Patient Instructions: 1)  Please schedule a follow-up appointment in 6 months. 2)  Limit your Sodium (Salt) to less than 2 grams a day(slightly less than 1/2 a teaspoon) to prevent fluid retention, swelling, or worsening of symptoms. 3)  It is  important that you exercise regularly at least 20 minutes 5 times a week. If you develop chest pain, have severe difficulty breathing, or feel very tired , stop exercising immediately and seek medical attention. Prescriptions: XOPENEX CONCENTRATE 1.25 MG/0.5ML  NEBU (LEVALBUTEROL HCL) use four times daily  #120 x 6   Entered and Authorized by:   Gordy Savers  MD   Signed by:   Gordy Savers  MD on 02/08/2010   Method used:   Electronically to        [facility]  [address] [facility]* (retail)       [address]       [place], [place]  [address]       Ph: [phone] or [phone]       Fax: [phone]   RxID:   [id number] ADVAIR DISKUS 250-50 MCG/DOSE  MISC (FLUTICASONE-SALMETEROL) use twice daily  #3 x 6   Entered and Authorized by:   Gordy Savers  MD   Signed by:   Gordy Savers  MD on 02/08/2010   Method used:   Electronically to        [facility]  [address] [facility] (retail)       [address]  [place], [place]  [address]       Ph: [phone] or [phone]       Fax: [phone]   RxID:   [id number] HYDROCHLOROTHIAZIDE 25 MG  TABS (HYDROCHLOROTHIAZIDE) Take 1 tablet by mouth once a day  #90 x 6   Entered and Authorized by:   Gordy Savers  MD   Signed by:   Gordy Savers  MD on 02/08/2010   Method used:   Electronically to        [facility]  [address] [facility] (retail)       [address]       [place], [place]  [address]       Ph: [phone] or [phone]       Fax: [phone]   RxID:   [id number] DILTIAZEM HCL ER BEADS 240 MG  CP24 (DILTIAZEM HCL ER BEADS) one  capsule by mouth once a day  #90 x 6   Entered and Authorized by:   Gordy Savers  MD   Signed by:   Gordy Savers  MD on 02/08/2010   Method used:   Electronically to        [facility]  [address] [facility] (retail)       [address]       [place], [place]  [address]       Ph: [phone] or [phone]       Fax: [phone]   RxID:   [id number] ZESTRIL 20 MG  TABS (LISINOPRIL) Take 1 tablet by mouth once a day  #90 x 6   Entered and Authorized by:   Gordy Savers  MD   Signed by:   Gordy Savers  MD on 02/08/2010   Method used:   Electronically to        [facility]  [address] [facility] (retail)       [address]       [place], [place]  [address]       Ph: [phone] or [phone]       Fax: [phone]   RxID:   [id number]

## 2010-10-17 NOTE — Miscellaneous (Signed)
  Medications Added VENTOLIN HFA 108 (90 BASE) MCG/ACT AERS (ALBUTEROL SULFATE) 2 puffs q6h as needed       Clinical Lists Changes  Medications: Removed medication of XOPENEX HFA 45 MCG/ACT  AERO (LEVALBUTEROL TARTRATE) two inhalations every 6 hours Added new medication of VENTOLIN HFA 108 (90 BASE) MCG/ACT AERS (ALBUTEROL SULFATE) 2 puffs q6h as needed

## 2010-10-17 NOTE — Progress Notes (Signed)
Summary: Rx  Phone Note Call from Patient Call back at Home Phone [phone]   Caller: Patient Call For: Stephen Savers  MD Summary of Call: C/o sneezing, watery eyes and buzzing in ears x 1 week. No fever or cough. Can you send Rx? [facility]  Follow-up for Phone Call        suggest zyrtec OTC use daily Follow-up by: Stephen Savers  MD,  November 24, 2009 10:42 AM  Additional Follow-up for Phone Call Additional follow up Details #1::        Phone Call Completed Additional Follow-up by: Raechel Ache, RN,  November 24, 2009 10:49 AM

## 2010-10-17 NOTE — Assessment & Plan Note (Signed)
Summary: 6 month rov/njr   Vital Signs:  Patient profile:   70 year old male Weight:      146 pounds Temp:     98.0 degrees F oral BP sitting:   130 / 80  (right arm) Cuff size:   regular  Vitals Entered By: Duard Brady LPN (August 08, 2010 8:49 AM) CC: 6 mos rov - doing well Is Patient Diabetic? No   CC:  6 mos rov - doing well.  History of Present Illness: 70 year old patient   who  has a history of COPD, paroxysmal atrial fibrillation,  and treated hypertension His pulmonary  status has been fairly stable over the past 6 months.  He has some mild occasional wheezing, which is well-controlled with his inhalational medications.  He has hypertension, which has no well-controlled.  No history of palpitations.  Allergies: 1)  ! Penicillin V Potassium (Penicillin V Potassium)  Past History:  Past Medical History: Reviewed history from 04/20/2008 and no changes required. Asthma Hypertension COPD Atrial fibrillation Benign prostatic hypertrophy  Physical Exam  General:  Well-developed,well-nourished,in no acute distress; alert,appropriate and cooperative throughout examination Head:  Normocephalic and atraumatic without obvious abnormalities. No apparent alopecia or balding. Eyes:  No corneal or conjunctival inflammation noted. EOMI. Perrla. Funduscopic exam benign, without hemorrhages, exudates or papilledema. Vision grossly normal. Mouth:  Oral mucosa and oropharynx without lesions or exudates.  Teeth in good repair. Neck:  No deformities, masses, or tenderness noted. Lungs:  Normal respiratory effort, chest expands symmetrically. Lungs are clear to auscultation, no crackles or wheezes. Heart:  Normal rate and regular rhythm. S1 and S2 normal without gallop, murmur, click, rub or other extra sounds. Abdomen:  Bowel sounds positive,abdomen soft and non-tender without masses, organomegaly or hernias noted. Msk:  No deformity or scoliosis noted of thoracic or lumbar  spine.   Pulses:  R and L carotid,radial,femoral,dorsalis pedis and posterior tibial pulses are full and equal bilaterally   Impression & Recommendations:  Problem # 1:  ATRIAL FIBRILLATION (ICD-427.31)  His updated medication list for this problem includes:    Diltiazem Hcl Er Beads 240 Mg Cp24 (Diltiazem hcl er beads) ..... One  capsule by mouth once a day  Problem # 2:  COPD (ICD-496)  His updated medication list for this problem includes:    Advair Diskus 250-50 Mcg/dose Misc (Fluticasone-salmeterol) ..... Use twice daily    Xopenex Concentrate 1.25 Mg/0.28ml Nebu (Levalbuterol hcl) ..... Use four times daily    Ventolin Hfa 108 (90 Base) Mcg/act Aers (Albuterol sulfate) .Marland Kitchen... 2 puffs q6h as needed  Problem # 3:  HYPERTENSION (ICD-401.9)  His updated medication list for this problem includes:    Zestril 20 Mg Tabs (Lisinopril) .Marland Kitchen... Take 1 tablet by mouth once a day    Diltiazem Hcl Er Beads 240 Mg Cp24 (Diltiazem hcl er beads) ..... One  capsule by mouth once a day    Hydrochlorothiazide 25 Mg Tabs (Hydrochlorothiazide) .Marland Kitchen... Take 1 tablet by mouth once a day  Complete Medication List: 1)  Zestril 20 Mg Tabs (Lisinopril) .... Take 1 tablet by mouth once a day 2)  Multivitamins Caps (Multiple vitamin) .... Once daily 3)  Diltiazem Hcl Er Beads 240 Mg Cp24 (Diltiazem hcl er beads) .... One  capsule by mouth once a day 4)  Hydrochlorothiazide 25 Mg Tabs (Hydrochlorothiazide) .... Take 1 tablet by mouth once a day 5)  Advair Diskus 250-50 Mcg/dose Misc (Fluticasone-salmeterol) .... Use twice daily 6)  Xopenex Concentrate 1.25 Mg/0.22ml  Nebu (Levalbuterol hcl) .... Use four times daily 7)  Xyzal 5 Mg Tabs (Levocetirizine dihydrochloride) .... One daily. 8)  Ventolin Hfa 108 (90 Base) Mcg/act Aers (Albuterol sulfate) .... 2 puffs q6h as needed  Patient Instructions: 1)  Please schedule a follow-up appointment in 4 months for CPX  2)  Advised not to eat any food or drink any liquids  after 10 PM the night before your procedure. 3)  Limit your Sodium (Salt) to less than 2 grams a day(slightly less than 1/2 a teaspoon) to prevent fluid retention, swelling, or worsening of symptoms. 4)  It is important that you exercise regularly at least 20 minutes 5 times a week. If you develop chest pain, have severe difficulty breathing, or feel very tired , stop exercising immediately and seek medical attention. Prescriptions: VENTOLIN HFA 108 (90 BASE) MCG/ACT AERS (ALBUTEROL SULFATE) 2 puffs q6h as needed  #3 x 3   Entered and Authorized by:   Gordy Savers  MD   Signed by:   Gordy Savers  MD on 08/08/2010   Method used:   Electronically to        [facility]  [address] [id number]* (retail)       [address]       [place], [place]  [address]       Ph: [phone] or [phone]       Fax: [phone]   RxID:   [id number] XYZAL 5 MG TABS (LEVOCETIRIZINE DIHYDROCHLORIDE) One daily.  #30 x 6   Entered and Authorized by:   Gordy Savers  MD   Signed by:   Gordy Savers  MD on 08/08/2010   Method used:   Electronically to        [facility] [id number]* (retail)       [address]       [place], [place]  [address]       Ph: [phone] or [phone]       Fax: [phone]   RxID:   [id number] XOPENEX CONCENTRATE 1.25 MG/0.5ML  NEBU (LEVALBUTEROL HCL) use four times daily  #120 x 6   Entered and Authorized by:   Gordy Savers  MD   Signed by:   Gordy Savers  MD on 08/08/2010   Method used:   Electronically to        [facility]  [address] [id number]* (retail)       [address]       [place], [place]  [address]       Ph: [phone] or [phone]       Fax: [phone]   RxID:   [id number] ADVAIR DISKUS 250-50 MCG/DOSE  MISC (FLUTICASONE-SALMETEROL) use twice daily  #3 x 6   Entered and Authorized by:   Gordy Savers  MD   Signed by:   Gordy Savers  MD on 08/08/2010   Method used:   Electronically to        [facility]  [address] [id number]* (retail)       [address]       [place], [place]  [address]       Ph: [phone] or [phone]       Fax: [phone]   RxID:   [id number]  HYDROCHLOROTHIAZIDE 25 MG  TABS (HYDROCHLOROTHIAZIDE) Take 1 tablet by mouth once a day  #90 x 6   Entered and Authorized by:   Gordy Savers  MD   Signed by:   Gordy Savers  MD on 08/08/2010   Method used:   Electronically to        [facility]  [address] [id number]* (retail)       [address]       [place], [place]  [address]       Ph: [phone] or [phone]       Fax: [phone]   RxID:   [id number] DILTIAZEM HCL ER BEADS 240 MG  CP24 (DILTIAZEM HCL ER BEADS) one  capsule by mouth once a day  #90 x 6   Entered and Authorized by:   Gordy Savers  MD   Signed by:   Gordy Savers  MD on 08/08/2010   Method used:   Electronically to        [facility]  [address] [id number]* (retail)       [address]       [place], [place]  [address]       Ph: [phone] or [phone]       Fax: [phone]   RxID:   [id number] ZESTRIL 20 MG  TABS (LISINOPRIL) Take 1 tablet by mouth once a day  #90 x 6   Entered and Authorized by:   Gordy Savers  MD   Signed by:   Gordy Savers  MD on 08/08/2010   Method used:   Electronically to        [facility]  [address] [id number]* (retail)       [address]       [place], [place]  [address]       Ph: [phone] or [phone]       Fax: [phone]   RxID:   [id number]    Orders Added: 1)  Est. Patient Level III [[address]]

## 2010-10-19 NOTE — Assessment & Plan Note (Signed)
Summary: increased SOB/DM   Vital Signs:  Patient profile:   70 year old male Weight:      136 pounds O2 Sat:      97 % on Room air Temp:     98.1 degrees F oral Pulse rate:   100 / minute BP sitting:   136 / 82  (left arm) Cuff size:   regular  Vitals Entered By: Duard Brady LPN (October 10, 2010 2:34 PM)  O2 Flow:  Room air CC: less congestion -but still very hard to breathe- SOB worse Is Patient Diabetic? No   CC:  less congestion -but still very hard to breathe- SOB worse.  History of Present Illness: 70 -year-old patient who has a history of advanced COPD.  He was evaluated two weeks ago and returns today complaining of persistent shortness of breath.  He states his chest congestion is much improved.  He is on nebulizer home treatments Advair, but has never been treated with Spiriva.  He complains of weakness.  He does have a history of paroxysmal atrial for ablation as well as hypertension.  Denies any wheezing or sputum production.  He was seen in the ED, her in a month, and chest x-ray revealed no infiltrate, and laboratory studies were unrevealing  Allergies: 1)  ! Penicillin V Potassium (Penicillin V Potassium)  Past History:  Past Medical History: Reviewed history from 04/20/2008 and no changes required. Asthma Hypertension COPD Atrial fibrillation Benign prostatic hypertrophy  Past Surgical History: Reviewed history from 06/25/2008 and no changes required. Tonsillectomy sigmoidoscopy 2002 colonoscopy 2007  Family History: Reviewed history from 05/28/2007 and no changes required. father died age 55 mother died age 70 apparently, an autoimmune  disorder one sister, health unknown  Social History: Reviewed history from 05/28/2007 and no changes required. wife deceased from colon cancer  Review of Systems       The patient complains of anorexia, weight loss, dyspnea on exertion, muscle weakness, difficulty walking, and depression.  The patient  denies fever, weight gain, vision loss, decreased hearing, hoarseness, chest pain, syncope, peripheral edema, prolonged cough, headaches, hemoptysis, abdominal pain, melena, hematochezia, severe indigestion/heartburn, hematuria, incontinence, genital sores, suspicious skin lesions, transient blindness, unusual weight change, abnormal bleeding, enlarged lymph nodes, angioedema, breast masses, and testicular masses.    Physical Exam  General:  no acute distress at rest; blood pressure on arrival 136/82 present blood pressure 94/64 Head:  Normocephalic and atraumatic without obvious abnormalities. No apparent alopecia or balding. Eyes:  No corneal or conjunctival inflammation noted. EOMI. Perrla. Funduscopic exam benign, without hemorrhages, exudates or papilledema. Vision grossly normal. Mouth:  Oral mucosa and oropharynx without lesions or exudates.  Teeth in good repair. Neck:  No deformities, masses, or tenderness noted. Lungs:  normal respiratory effort, no intercostal retractions, and no accessory muscle use.   O2 saturation 94-95% prolonged expiratory phase a few rhonchi noted in expiration no wheezingnormal respiratory effort, no intercostal retractions, and no accessory muscle use.   Heart:  irregular rhythm, with a rate of 100 Abdomen:  Bowel sounds positive,abdomen soft and non-tender without masses, organomegaly or hernias noted. Msk:  No deformity or scoliosis noted of thoracic or lumbar spine.     Impression & Recommendations:  Problem # 1:  ATRIAL FIBRILLATION, PAROXYSMAL (ICD-427.31)  His updated medication list for this problem includes:    Diltiazem Hcl Er Beads 240 Mg Cp24 (Diltiazem hcl er beads) ..... One  capsule by mouth once a day presently  normal sinus rhythm  His  updated medication list for this problem includes:    Diltiazem Hcl Er Beads 240 Mg Cp24 (Diltiazem hcl er beads) ..... One  capsule by mouth once a day  Problem # 2:  HYPOTENSION (ICD-458.9) will  hold on blood pressure medications and reassess in 3 days; will liberalize fluid and salt intake  Problem # 3:  COPD (ICD-496)  His updated medication list for this problem includes:    Advair Diskus 250-50 Mcg/dose Misc (Fluticasone-salmeterol) ..... Use twice daily    Xopenex Concentrate 1.25 Mg/0.77ml Nebu (Levalbuterol hcl) ..... Use four times daily    Ventolin Hfa 108 (90 Base) Mcg/act Aers (Albuterol sulfate) .Marland Kitchen... 2 puffs q6h as needed    Spiriva Handihaler 18 Mcg Caps (Tiotropium bromide monohydrate) ..... Use once daily  His updated medication list for this problem includes:    Advair Diskus 250-50 Mcg/dose Misc (Fluticasone-salmeterol) ..... Use twice daily    Xopenex Concentrate 1.25 Mg/0.56ml Nebu (Levalbuterol hcl) ..... Use four times daily    Ventolin Hfa 108 (90 Base) Mcg/act Aers (Albuterol sulfate) .Marland Kitchen... 2 puffs q6h as needed    Spiriva Handihaler 18 Mcg Caps (Tiotropium bromide monohydrate) ..... Use once daily  Complete Medication List: 1)  Multivitamins Caps (Multiple vitamin) .... Once daily 2)  Diltiazem Hcl Er Beads 240 Mg Cp24 (Diltiazem hcl er beads) .... One  capsule by mouth once a day 3)  Advair Diskus 250-50 Mcg/dose Misc (Fluticasone-salmeterol) .... Use twice daily 4)  Xopenex Concentrate 1.25 Mg/0.57ml Nebu (Levalbuterol hcl) .... Use four times daily 5)  Ventolin Hfa 108 (90 Base) Mcg/act Aers (Albuterol sulfate) .... 2 puffs q6h as needed 6)  Spiriva Handihaler 18 Mcg Caps (Tiotropium bromide monohydrate) .... Use once daily  Patient Instructions: 1)  Please schedule a follow-up appointment in 3 days  2)  Drink as much fluid as you can tolerate for the next few days.a Prescriptions: SPIRIVA HANDIHALER 18 MCG CAPS (TIOTROPIUM BROMIDE MONOHYDRATE) use once daily  #30 x 0   Entered and Authorized by:   Gordy Savers  MD   Signed by:   Gordy Savers  MD on 10/10/2010   Method used:   Electronically to        [facility]  [address] [id number]  (retail)       [address]       [place], [place]  [address]       Ph: [phone] or [phone]       Fax: [phone]   RxID:   [id number]    Orders Added: 1)  Est. Patient Level III [601[address]]

## 2010-10-19 NOTE — Assessment & Plan Note (Signed)
Summary: Sob/dm   Vital Signs:  Patient profile:   70 year old male Weight:      141 pounds O2 Sat:      92 % on Room air Temp:     97.6 degrees F oral Pulse rate:   110 / minute Resp:     24 per minute BP sitting:   140 / 90  (left arm) Cuff size:   regular  Vitals Entered By: Duard Brady LPN (September 26, 2010 3:24 PM)  O2 Flow:  Room air CC: c/o difficulty breathing - was seen at [facility] long this weekend Is Patient Diabetic? No   CC:  c/o difficulty breathing - was seen at [facility] long this weekend.  History of Present Illness: 70 -year-old patient who has a history of advanced COPD.  He was evaluated at the ED 4 days ago due to worsening cough and shortness of breath and chest x-ray was obtained that revealed no infiltrate.  He was discharged on azithromycin and a prednisone dose pack.  He states that his O2 saturation was 90% in the ED, and today it is 94%.  He states that he is improving daily.  Has some occasional wheezing, but no chest pain or productive cough.  He does have a home nebulizer treatments as he utilizes.  Allergies: 1)  ! Penicillin V Potassium (Penicillin V Potassium)  Past History:  Past Medical History: Reviewed history from 04/20/2008 and no changes required. Asthma Hypertension COPD Atrial fibrillation Benign prostatic hypertrophy  Family History: Reviewed history from 05/28/2007 and no changes required. father died age 70 mother died age 16 apparently, an autoimmune  disorder one sister, health unknown  Review of Systems       The patient complains of anorexia, dyspnea on exertion, and prolonged cough.  The patient denies fever, weight loss, weight gain, vision loss, decreased hearing, hoarseness, chest pain, syncope, peripheral edema, headaches, hemoptysis, abdominal pain, melena, hematochezia, severe indigestion/heartburn, hematuria, incontinence, genital sores, muscle weakness, suspicious skin lesions, transient blindness, difficulty  walking, depression, unusual weight change, abnormal bleeding, enlarged lymph nodes, angioedema, breast masses, and testicular masses.    Physical Exam  General:  Well-developed,well-nourished,in no acute distress; alert,appropriate and cooperative throughout examination Head:  Normocephalic and atraumatic without obvious abnormalities. No apparent alopecia or balding. Eyes:  No corneal or conjunctival inflammation noted. EOMI. Perrla. Funduscopic exam benign, without hemorrhages, exudates or papilledema. Vision grossly normal. Ears:  External ear exam shows no significant lesions or deformities.  Otoscopic examination reveals clear canals, tympanic membranes are intact bilaterally without bulging, retraction, inflammation or discharge. Hearing is grossly normal bilaterally. Mouth:  Oral mucosa and oropharynx without lesions or exudates.  Teeth in good repair. Neck:  No deformities, masses, or tenderness noted. Lungs:  prolonged expiratory phase scattered rhonchi and a few faint wheezing O2 saturation 94normal respiratory effort, no intercostal retractions, and no accessory muscle use.   Heart:  Normal rate and regular rhythm. S1 and S2 normal without gallop, murmur, click, rub or other extra sounds.  heart rate 108 Extremities:  No clubbing, cyanosis, edema, or deformity noted with normal full range of motion of all joints.     Impression & Recommendations:  Problem # 1:  COPD (ICD-496)  His updated medication list for this problem includes:    Advair Diskus 250-50 Mcg/dose Misc (Fluticasone-salmeterol) ..... Use twice daily    Xopenex Concentrate 1.25 Mg/0.34ml Nebu (Levalbuterol hcl) ..... Use four times daily    Ventolin Hfa 108 (90 Base) Mcg/act Aers (  Albuterol sulfate) .Marland Kitchen... 2 puffs q6h as needed  Orders: Depo- Medrol 80mg  (J1040) Admin of Therapeutic Inj  intramuscular or subcutaneous (78295)  Problem # 2:  ATRIAL FIBRILLATION, PAROXYSMAL (ICD-427.31)  His updated medication  list for this problem includes:    Diltiazem Hcl Er Beads 240 Mg Cp24 (Diltiazem hcl er beads) ..... One  capsule by mouth once a day  Complete Medication List: 1)  Zestril 20 Mg Tabs (Lisinopril) .... Take 1 tablet by mouth once a day 2)  Multivitamins Caps (Multiple vitamin) .... Once daily 3)  Diltiazem Hcl Er Beads 240 Mg Cp24 (Diltiazem hcl er beads) .... One  capsule by mouth once a day 4)  Hydrochlorothiazide 25 Mg Tabs (Hydrochlorothiazide) .... Take 1 tablet by mouth once a day 5)  Advair Diskus 250-50 Mcg/dose Misc (Fluticasone-salmeterol) .... Use twice daily 6)  Xopenex Concentrate 1.25 Mg/0.40ml Nebu (Levalbuterol hcl) .... Use four times daily 7)  Xyzal 5 Mg Tabs (Levocetirizine dihydrochloride) .... One daily. 8)  Ventolin Hfa 108 (90 Base) Mcg/act Aers (Albuterol sulfate) .... 2 puffs q6h as needed 9)  Zithromax 1 Gm Pack (Azithromycin) .... As directed 10)  Prednisone 20 Mg Tabs (Prednisone) .... 0ne  by mouth twice  daily x5day, then one once daily for 5 days  Patient Instructions: 1)  call if there is any clinical worsening 2)  Take your antibiotic as prescribed until ALL of it is gone, but stop if you develop a rash or swelling and contact our office as soon as possible. 3)  Drink as much fluid as you can tolerate for the next few days. 4)  Get plenty of rest, drink lots of clear liquids, and use Tylenol or Ibuprofen for fever and comfort. Return in 7-10 days if you're not better:sooner if you're feeling worse. Prescriptions: PREDNISONE 20 MG TABS (PREDNISONE) 0ne  by mouth twice  daily x5day, then one once daily for 5 days  #15 x 0   Entered and Authorized by:   Gordy Savers  MD   Signed by:   Gordy Savers  MD on 09/26/2010   Method used:   Electronically to        [facility]  [address] [id number]* (retail)       [address]       [place], [place]  [address]       Ph: [phone] or [phone]       Fax: [phone]   RxID:    [id number]    Medication Administration  Injection # 1:    Medication: Depo- Medrol 80mg     Diagnosis: COPD (ICD-496)    Route: IM    Site: R deltoid    Exp Date: 03/2013    Lot #: [id number]    Mfr: Pharmacia    Patient tolerated injection without complications    Given by: Duard Brady LPN (September 26, 2010 4:14 PM)  Orders Added: 1)  Depo- Medrol 80mg  [J1040] 2)  Admin of Therapeutic Inj  intramuscular or subcutaneous [96372] 3)  Est. Patient Level III [64332]

## 2010-10-25 NOTE — Assessment & Plan Note (Signed)
Summary: 3 DAY ROV/NJR   Vital Signs:  Patient profile:   70 year old male Temp:     98.0 degrees F oral BP sitting:   128 / 80  (left arm) Cuff size:   regular  Vitals Entered By: Duard Brady LPN (October 13, 2010 3:10 PM) CC: rov - 3 day  - improving   CC:  rov - 3 day  - improving.  History of Present Illness: 34 -year-old patient who is seen today in follow-up.  He was seen 3 days ago and noted to be quite hypotensive weak with increasing shortness of breath.  Diuretic and lisinopril discontinued and he has been asked to liberalize his fluid and salt intake.  Today he feels dramatically improved.  His breathing is much better.  Allergies: 1)  ! Penicillin V Potassium (Penicillin V Potassium)  Past History:  Past Medical History: Reviewed history from 04/20/2008 and no changes required. Asthma Hypertension COPD Atrial fibrillation Benign prostatic hypertrophy  Past Surgical History: Reviewed history from 06/25/2008 and no changes required. Tonsillectomy sigmoidoscopy 2002 colonoscopy 2007  Review of Systems       The patient complains of dyspnea on exertion.  The patient denies anorexia, fever, weight loss, weight gain, vision loss, decreased hearing, hoarseness, chest pain, syncope, peripheral edema, prolonged cough, headaches, hemoptysis, abdominal pain, melena, hematochezia, severe indigestion/heartburn, hematuria, incontinence, genital sores, muscle weakness, suspicious skin lesions, transient blindness, difficulty walking, depression, unusual weight change, abnormal bleeding, enlarged lymph nodes, angioedema, breast masses, and testicular masses.    Physical Exam  General:  Well-developed,well-nourished,in no acute distress; alert,appropriate and cooperative throughout examination; 120/70 Head:  Normocephalic and atraumatic without obvious abnormalities. No apparent alopecia or balding. Mouth:  Oral mucosa and oropharynx without lesions or exudates.  Teeth  in good repair. Neck:  No deformities, masses, or tenderness noted. Lungs:  diminished breath sounds, but fairly clear Heart:  rate approximately 70 and irregular Extremities:  no peripheral edema   Impression & Recommendations:  Problem # 1:  HYPOTENSION (ICD-458.9) this has resolved.  Will continue to hold both diuretic, and lisinopril  Problem # 2:  COPD (ICD-496)  His updated medication list for this problem includes:    Advair Diskus 250-50 Mcg/dose Misc (Fluticasone-salmeterol) ..... Use twice daily    Xopenex Concentrate 1.25 Mg/0.52ml Nebu (Levalbuterol hcl) ..... Use four times daily    Ventolin Hfa 108 (90 Base) Mcg/act Aers (Albuterol sulfate) .Marland Kitchen... 2 puffs q6h as needed    Spiriva Handihaler 18 Mcg Caps (Tiotropium bromide monohydrate) ..... Use once daily  His updated medication list for this problem includes:    Advair Diskus 250-50 Mcg/dose Misc (Fluticasone-salmeterol) ..... Use twice daily    Xopenex Concentrate 1.25 Mg/0.67ml Nebu (Levalbuterol hcl) ..... Use four times daily    Ventolin Hfa 108 (90 Base) Mcg/act Aers (Albuterol sulfate) .Marland Kitchen... 2 puffs q6h as needed    Spiriva Handihaler 18 Mcg Caps (Tiotropium bromide monohydrate) ..... Use once daily  Complete Medication List: 1)  Multivitamins Caps (Multiple vitamin) .... Once daily 2)  Diltiazem Hcl Er Beads 240 Mg Cp24 (Diltiazem hcl er beads) .... One  capsule by mouth once a day 3)  Advair Diskus 250-50 Mcg/dose Misc (Fluticasone-salmeterol) .... Use twice daily 4)  Xopenex Concentrate 1.25 Mg/0.57ml Nebu (Levalbuterol hcl) .... Use four times daily 5)  Ventolin Hfa 108 (90 Base) Mcg/act Aers (Albuterol sulfate) .... 2 puffs q6h as needed 6)  Spiriva Handihaler 18 Mcg Caps (Tiotropium bromide monohydrate) .... Use once daily  Patient  Instructions: 1)  Please schedule a follow-up appointment in 3 months. 2)  Limit your Sodium (Salt).   Orders Added: 1)  Est. Patient Level III [10258]

## 2010-11-08 ENCOUNTER — Ambulatory Visit (INDEPENDENT_AMBULATORY_CARE_PROVIDER_SITE_OTHER): Payer: Medicare HMO | Admitting: Internal Medicine

## 2010-11-08 ENCOUNTER — Encounter: Payer: Self-pay | Admitting: Internal Medicine

## 2010-11-08 DIAGNOSIS — I4891 Unspecified atrial fibrillation: Secondary | ICD-10-CM

## 2010-11-08 DIAGNOSIS — J4489 Other specified chronic obstructive pulmonary disease: Secondary | ICD-10-CM

## 2010-11-08 DIAGNOSIS — J449 Chronic obstructive pulmonary disease, unspecified: Secondary | ICD-10-CM

## 2010-11-08 DIAGNOSIS — I10 Essential (primary) hypertension: Secondary | ICD-10-CM

## 2010-11-08 NOTE — Patient Instructions (Signed)
Return in 3 months for follow-up  use saline irrigation as discussed  Nasonex one puff each nares daily  Call or return to clinic prn if these symptoms worsen or fail to improve as anticipated.

## 2010-11-08 NOTE — Progress Notes (Signed)
  Subjective:    Patient ID: Stephen [name], male    DOB: 1941-07-07, 70 y.o.   MRN: [id number]  HPI   70 year old patient who has a history of advanced COPD. He presents today with a chief complaint of primarily sinus congestion. He was placed on Spiriva recently and has had a very nice clinical response. He feels his pulmonary status is fairly stable. He denies any shortness of breath productive cough or wheezing. He describes sinus congestion more on the right than the left he has had some benefit from over-the-counter sinus medications. There's been no fever or purulent drainage    Review of Systems  Constitutional: Negative for fever, chills, appetite change and fatigue.  HENT: Positive for congestion. Negative for hearing loss, ear pain, sore throat, trouble swallowing, neck stiffness, dental problem, voice change and tinnitus.   Eyes: Negative for pain, discharge and visual disturbance.  Respiratory: Negative for cough, chest tightness, wheezing and stridor.   Cardiovascular: Negative for chest pain, palpitations and leg swelling.  Gastrointestinal: Negative for nausea, vomiting, abdominal pain, diarrhea, constipation, blood in stool and abdominal distention.  Genitourinary: Negative for urgency, hematuria, flank pain, discharge, difficulty urinating and genital sores.  Musculoskeletal: Negative for myalgias, back pain, joint swelling, arthralgias and gait problem.  Skin: Negative for rash.  Neurological: Negative for dizziness, syncope, speech difficulty, weakness, numbness and headaches.  Hematological: Negative for adenopathy. Does not bruise/bleed easily.  Psychiatric/Behavioral: Negative for behavioral problems and dysphoric mood. The patient is not nervous/anxious.        Objective:   Physical Exam  Constitutional: He is oriented to person, place, and time. He appears well-developed.  HENT:  Head: Normocephalic.  Right Ear: External ear normal.  Left Ear: External ear  normal.  Mouth/Throat: Oropharynx is clear and moist.        Mucosal edema more marked on the right side  Eyes: Conjunctivae and EOM are normal.  Neck: Normal range of motion.  Cardiovascular: Normal rate and normal heart sounds.   Pulmonary/Chest: Breath sounds normal. No respiratory distress. He has no wheezes. He has no rales. He exhibits no tenderness.        Generally diminished breath sounds but clear. O2 saturation 97% on room air  Abdominal: Bowel sounds are normal.  Musculoskeletal: Normal range of motion. He exhibits no edema and no tenderness.  Neurological: He is alert and oriented to person, place, and time.  Psychiatric: He has a normal mood and affect. His behavior is normal.          Assessment & Plan:   sinus congestion secondary to viral URI. We'll treat with saline irrigation was given a prescription for Nasonex nasal spray.  COPD stable  Hypertension stable

## 2010-11-16 ENCOUNTER — Encounter: Payer: Self-pay | Admitting: Internal Medicine

## 2010-11-17 ENCOUNTER — Encounter: Payer: Self-pay | Admitting: Internal Medicine

## 2010-11-17 ENCOUNTER — Ambulatory Visit (INDEPENDENT_AMBULATORY_CARE_PROVIDER_SITE_OTHER): Payer: Medicare HMO | Admitting: Internal Medicine

## 2010-11-17 DIAGNOSIS — I10 Essential (primary) hypertension: Secondary | ICD-10-CM

## 2010-11-17 DIAGNOSIS — I4891 Unspecified atrial fibrillation: Secondary | ICD-10-CM

## 2010-11-17 DIAGNOSIS — J449 Chronic obstructive pulmonary disease, unspecified: Secondary | ICD-10-CM

## 2010-11-17 DIAGNOSIS — Z0000 Encounter for general adult medical examination without abnormal findings: Secondary | ICD-10-CM

## 2010-11-17 DIAGNOSIS — N400 Enlarged prostate without lower urinary tract symptoms: Secondary | ICD-10-CM

## 2010-11-17 DIAGNOSIS — J4489 Other specified chronic obstructive pulmonary disease: Secondary | ICD-10-CM

## 2010-11-17 LAB — LIPID PANEL
Cholesterol: 185 mg/dL (ref 0–200)
HDL: 64.5 mg/dL (ref >39.00)
LDL Cholesterol: 108 mg/dL — ABNORMAL HIGH (ref 0–99)
Total CHOL/HDL Ratio: 3
Triglycerides: 65 mg/dL (ref 0.0–149.0)
VLDL: 13 mg/dL (ref 0.0–40.0)

## 2010-11-17 LAB — PSA: PSA: 3.84 ng/mL (ref 0.10–4.00)

## 2010-11-17 MED ORDER — LISINOPRIL 20 MG PO TABS: 20.0000 mg | ORAL_TABLET | Freq: Every day | ORAL | Status: DC

## 2010-11-17 MED ORDER — ALBUTEROL SULFATE HFA 108 (90 BASE) MCG/ACT IN AERS: 2.0000 | INHALATION_SPRAY | Freq: Four times a day (QID) | RESPIRATORY_TRACT | Status: DC | PRN

## 2010-11-17 MED ORDER — TIOTROPIUM BROMIDE MONOHYDRATE 18 MCG IN CAPS: 18.0000 ug | ORAL_CAPSULE | Freq: Every day | RESPIRATORY_TRACT | Status: DC

## 2010-11-17 MED ORDER — DILTIAZEM HCL ER 240 MG PO CP24: 240.0000 mg | ORAL_CAPSULE | Freq: Every day | ORAL | Status: DC

## 2010-11-17 MED ORDER — FLUTICASONE-SALMETEROL 250-50 MCG/DOSE IN AEPB: 1.0000 | INHALATION_SPRAY | Freq: Two times a day (BID) | RESPIRATORY_TRACT | Status: DC

## 2010-11-17 MED ORDER — LEVALBUTEROL HCL 1.25 MG/0.5ML IN NEBU: 1.0000 | INHALATION_SOLUTION | RESPIRATORY_TRACT | Status: DC | PRN

## 2010-11-17 NOTE — Progress Notes (Signed)
Addended by: Eleonore Chiquito on: 11/17/2010 12:32 PM   Modules accepted: Level of Service

## 2010-11-17 NOTE — Patient Instructions (Signed)
It is important that you exercise regularly, at least 20 minutes 3 to 4 times per week.  If you develop chest pain or shortness of breath seek  medical attention.  Return in 6 months for follow-up  

## 2010-11-17 NOTE — Progress Notes (Signed)
Subjective:    Patient ID: Stephen [name], male    DOB: [birth year], 70 y.o.   MRN: [id number]  HPI   70 year old patient who is seen today for a comprehensive annual evaluation. He has a history of COPD as well as hypertension. Approximately 6 weeks ago he had some hypotension in the setting of acute illness and lisinopril and diuretic therapy have been on hold. Blood pressure today elevated. His pulmonary status has been stable.\  1. Risk factors, based on past  M,S,F history   Coronary vascular risk factors include a history of hypertension  2.  Physical activities: Limited somewhat by his pulmonary disease and dyspnea on exertion  3.  Depression/mood: history depression or mood disorder  4.  Hearing: no significant deficits  5.  ADL's: independent in all aspects of daily living  6.  Fall risk: low  7.  Home safety: no problems identified  8.  Height weight, and visual acuity; height and weight stable no change in visual acuity  9.  Counseling: heart healthy diet encouraged ;  more exercise recommended  10. Lab orders based on risk factors: lipid profile PSA will be reviewed  11. Referral-  None appropriate at this time 12. Care plan:  Heart healthy diet regular exercise encouraged colonoscopy in approximately 2 years  13. Cognitive assessment:  Alert and oriented with normal affect neurocognitive dysfunction      Vitals Entered By: Raechel Ache, RN (October 12, 2009 8:43 AM)  O2 Flow:  Room air CC: OV, fasting. C/o SOB, congestion.   CC:  OV, fasting. C/o SOB, and congestion.Marland Kitchen  History of Present Illness:  70 year old patient who is seen today for a comprehensive evaluation.  he has a history of advanced COPD, which has been fairly stable.  For the past few days, he has noticed an increasing cough and congestion is slightly worsened dyspnea on exertion.  He has treated hypertension, history of BPH and also a history of paroxysmal atrial fibrillation. He is on  maintenance Advair, as well as home nebulizer treatments with Xopenex.  No new concerns or complaints.  In general, his pulmonary status has been stable  Allergies:  1)  ! Penicillin V Potassium (Penicillin V Potassium)  Past History:  Past Medical History: Reviewed history from 04/20/2008 and no changes required. Asthma Hypertension COPD Atrial fibrillation Benign prostatic hypertrophy  Past Surgical History: Reviewed history from 06/25/2008 and no changes required. Tonsillectomy sigmoidoscopy 2002 colonoscopy 2007  Family History: Reviewed history from 05/28/2007 and no changes required. father died age 22 mother died age 19 apparently, an autoimmune  disorder one sister, health unknown  Social History: Reviewed history from 05/28/2007 and no changes required. wife deceased from colon cancer    Review of Systems  Constitutional: Negative for fever, chills, activity change, appetite change and fatigue.  HENT: Negative for hearing loss, ear pain, congestion, rhinorrhea, sneezing, mouth sores, trouble swallowing, neck pain, neck stiffness, dental problem, voice change, sinus pressure and tinnitus.   Eyes: Negative for photophobia, pain, redness and visual disturbance.  Respiratory: Positive for shortness of breath. Negative for apnea, cough, choking, chest tightness and wheezing.   Cardiovascular: Negative for chest pain, palpitations and leg swelling.  Gastrointestinal: Negative for nausea, vomiting, abdominal pain, diarrhea, constipation, blood in stool, abdominal distention, anal bleeding and rectal pain.  Genitourinary: Negative for dysuria, urgency, frequency, hematuria, flank pain, decreased urine volume, discharge, penile swelling, scrotal swelling, difficulty urinating, genital sores and testicular pain.  Musculoskeletal: Negative for myalgias, back  pain, joint swelling, arthralgias and gait problem.  Skin: Negative for color change, rash and wound.  Neurological:  Negative for dizziness, tremors, seizures, syncope, facial asymmetry, speech difficulty, weakness, light-headedness, numbness and headaches.  Hematological: Negative for adenopathy. Does not bruise/bleed easily.  Psychiatric/Behavioral: Negative for suicidal ideas, hallucinations, behavioral problems, confusion, sleep disturbance, self-injury, dysphoric mood, decreased concentration and agitation. The patient is not nervous/anxious.        Objective:   Physical Exam  Constitutional: He appears well-developed and well-nourished.  HENT:  Head: Normocephalic and atraumatic.  Right Ear: External ear normal.  Left Ear: External ear normal.  Nose: Nose normal.  Mouth/Throat: Oropharynx is clear and moist.  Eyes: Conjunctivae and EOM are normal. Pupils are equal, round, and reactive to light. No scleral icterus.  Neck: Normal range of motion. Neck supple. No JVD present. No thyromegaly present.  Cardiovascular: Regular rhythm and normal heart sounds.  Exam reveals no gallop and no friction rub.   No murmur heard.       The right dorsalis pedis pulse intact the other pedal pulses not easily palpable  Pulmonary/Chest: Effort normal. He has wheezes. He exhibits no tenderness.        O2 saturation 94 95%   A few scattered rhonchi  Increased AP diameter  There are diminished breath sounds  Abdominal: Soft. Bowel sounds are normal. He exhibits no distension and no mass. There is no tenderness.  Genitourinary: Prostate normal and penis normal.  Musculoskeletal: Normal range of motion. He exhibits no edema and no tenderness.  Lymphadenopathy:    He has no cervical adenopathy.  Neurological: He is alert. He has normal reflexes. No cranial nerve deficit. Coordination normal.  Skin: Skin is warm and dry. No rash noted.  Psychiatric: He has a normal mood and affect. His behavior is normal.          Assessment & Plan:   preventive health examination  Hypertension suboptimal control. We'll resume  lisinopril 20 mg daily  COPD  BPH. We'll check PSA

## 2010-12-08 ENCOUNTER — Ambulatory Visit: Payer: Medicare HMO | Admitting: Internal Medicine

## 2010-12-08 ENCOUNTER — Encounter: Payer: Self-pay | Admitting: Internal Medicine

## 2010-12-08 ENCOUNTER — Ambulatory Visit (INDEPENDENT_AMBULATORY_CARE_PROVIDER_SITE_OTHER): Payer: Medicare HMO | Admitting: Internal Medicine

## 2010-12-08 DIAGNOSIS — I10 Essential (primary) hypertension: Secondary | ICD-10-CM

## 2010-12-08 DIAGNOSIS — I4891 Unspecified atrial fibrillation: Secondary | ICD-10-CM

## 2010-12-08 DIAGNOSIS — J449 Chronic obstructive pulmonary disease, unspecified: Secondary | ICD-10-CM

## 2010-12-08 DIAGNOSIS — J45909 Unspecified asthma, uncomplicated: Secondary | ICD-10-CM

## 2010-12-08 DIAGNOSIS — J4489 Other specified chronic obstructive pulmonary disease: Secondary | ICD-10-CM

## 2010-12-08 MED ORDER — METHYLPREDNISOLONE ACETATE 80 MG/ML IJ SUSP
80.0000 mg | Freq: Once | INTRAMUSCULAR | Status: AC
  Administered 2010-12-08: 80 mg via INTRAMUSCULAR

## 2010-12-08 MED ORDER — PREDNISONE 20 MG PO TABS: 20.0000 mg | ORAL_TABLET | Freq: Two times a day (BID) | ORAL | Status: AC

## 2010-12-08 NOTE — Progress Notes (Signed)
  Subjective:    Patient ID: Stephen [name], male    DOB: 1940-11-18, 70 y.o.   MRN: [id number]  HPI   70 year old patient who has a history of advanced COPD. He has a history of hypertension as well as paroxysmal atrial fibrillation. He was stable until 2 days ago when he enjoyed a round of golf. He noticed that the course had been treated with an unknown chemical either  We control or fertilizer;  it was quite noxious. Later on that night he became having more chest tightness and wheezing. He has been using his maintenance medications as well as frequent Xopenex every 4-6 hours and use remains short of breath. There's been no sputum production or significant cough. There has been some rhinorrhea    Review of Systems  Constitutional: Negative for fever, chills, appetite change and fatigue.  HENT: Negative for hearing loss, ear pain, congestion, sore throat, trouble swallowing, neck stiffness, dental problem, voice change and tinnitus.   Eyes: Negative for pain, discharge and visual disturbance.  Respiratory: Positive for cough, shortness of breath and wheezing. Negative for chest tightness and stridor.   Cardiovascular: Negative for chest pain, palpitations and leg swelling.  Gastrointestinal: Negative for nausea, vomiting, abdominal pain, diarrhea, constipation, blood in stool and abdominal distention.  Genitourinary: Negative for urgency, hematuria, flank pain, discharge, difficulty urinating and genital sores.  Musculoskeletal: Negative for myalgias, back pain, joint swelling, arthralgias and gait problem.  Skin: Negative for rash.  Neurological: Negative for dizziness, syncope, speech difficulty, weakness, numbness and headaches.  Hematological: Negative for adenopathy. Does not bruise/bleed easily.  Psychiatric/Behavioral: Negative for behavioral problems and dysphoric mood. The patient is not nervous/anxious.        Objective:   Physical Exam  Constitutional: He is oriented to  person, place, and time. He appears well-developed and well-nourished. No distress.  HENT:  Head: Normocephalic.  Right Ear: External ear normal.  Left Ear: External ear normal.  Eyes: Conjunctivae and EOM are normal.  Neck: Normal range of motion.  Cardiovascular: Normal rate and normal heart sounds.   Pulmonary/Chest: Effort normal and breath sounds normal.        Mild resting tachypnea. O2 saturation was as high as 95% at rest. Pulse remained 120 and regular  Abdominal: Bowel sounds are normal.  Musculoskeletal: Normal range of motion. He exhibits no edema and no tenderness.  Neurological: He is alert and oriented to person, place, and time.  Psychiatric: He has a normal mood and affect. His behavior is normal.          Assessment & Plan:   exacerbation of COPD. The patient to a Depo-Medrol 80 mg IM. He was also given a prescription for prednisone for 7 additional days. He'll continue home medications including Xopenex nebulizer treatments. If he does not promptly improve he has been instructed to go to the ED for hospital admission.  Hypertension stable  Paroxysmal atrial for ablation

## 2010-12-08 NOTE — Patient Instructions (Signed)
Drink as much fluid as you  can tolerate over the next few days   Report to the emergency department for hospitalization if there is not prompt clinical improvement  Call or return to clinic prn if these symptoms worsen or fail to improve as anticipated.

## 2010-12-12 ENCOUNTER — Inpatient Hospital Stay (HOSPITAL_COMMUNITY)
Admission: EM | Admit: 2010-12-12 | Discharge: 2010-12-19 | DRG: 191 | Disposition: A | Discharge status: Home Health | Payer: Medicare HMO | Attending: Internal Medicine | Admitting: Internal Medicine

## 2010-12-12 ENCOUNTER — Emergency Department (HOSPITAL_COMMUNITY): Payer: Medicare HMO

## 2010-12-12 DIAGNOSIS — R0902 Hypoxemia: Secondary | ICD-10-CM | POA: Diagnosis present

## 2010-12-12 DIAGNOSIS — I129 Hypertensive chronic kidney disease with stage 1 through stage 4 chronic kidney disease, or unspecified chronic kidney disease: Secondary | ICD-10-CM | POA: Diagnosis present

## 2010-12-12 DIAGNOSIS — M8448XA Pathological fracture, other site, initial encounter for fracture: Secondary | ICD-10-CM | POA: Diagnosis present

## 2010-12-12 DIAGNOSIS — M810 Age-related osteoporosis without current pathological fracture: Secondary | ICD-10-CM | POA: Diagnosis present

## 2010-12-12 DIAGNOSIS — N179 Acute kidney failure, unspecified: Secondary | ICD-10-CM | POA: Diagnosis present

## 2010-12-12 DIAGNOSIS — J441 Chronic obstructive pulmonary disease with (acute) exacerbation: Principal | ICD-10-CM | POA: Diagnosis present

## 2010-12-12 DIAGNOSIS — Z87891 Personal history of nicotine dependence: Secondary | ICD-10-CM

## 2010-12-12 DIAGNOSIS — I4891 Unspecified atrial fibrillation: Secondary | ICD-10-CM | POA: Diagnosis present

## 2010-12-12 DIAGNOSIS — N183 Chronic kidney disease, stage 3 unspecified: Secondary | ICD-10-CM | POA: Diagnosis present

## 2010-12-13 ENCOUNTER — Emergency Department (HOSPITAL_COMMUNITY): Payer: Medicare HMO

## 2010-12-13 DIAGNOSIS — I4891 Unspecified atrial fibrillation: Secondary | ICD-10-CM

## 2010-12-13 LAB — POCT I-STAT, CHEM 8
BUN: 50 mg/dL — ABNORMAL HIGH (ref 6–23)
Calcium, Ion: 1.13 mmol/L (ref 1.12–1.32)
Chloride: 111 mEq/L (ref 96–112)
Potassium: 4.9 mEq/L (ref 3.5–5.1)

## 2010-12-13 LAB — CBC
HCT: 46.7 % (ref 39.0–52.0)
Hemoglobin: 15.6 g/dL (ref 13.0–17.0)
MCH: 32.4 pg (ref 26.0–34.0)
MCV: 96.9 fL (ref 78.0–100.0)
RBC: 4.82 MIL/uL (ref 4.22–5.81)

## 2010-12-13 LAB — CARDIAC PANEL(CRET KIN+CKTOT+MB+TROPI)
CK, MB: 4.4 ng/mL — ABNORMAL HIGH (ref 0.3–4.0)
CK, MB: 3.9 ng/mL (ref 0.3–4.0)
Relative Index: INVALID (ref 0.0–2.5)
Total CK: 67 U/L (ref 7–232)
Total CK: 54 U/L (ref 7–232)
Troponin I: 0.01 ng/mL (ref 0.00–0.06)

## 2010-12-13 LAB — DIFFERENTIAL
Eosinophils Absolute: 0 10*3/uL (ref 0.0–0.7)
Lymphocytes Relative: 4 % — ABNORMAL LOW (ref 12–46)
Lymphs Abs: 0.7 10*3/uL (ref 0.7–4.0)
Monocytes Relative: 4 % (ref 3–12)
Neutro Abs: 17 10*3/uL — ABNORMAL HIGH (ref 1.7–7.7)
Neutrophils Relative %: 92 % — ABNORMAL HIGH (ref 43–77)

## 2010-12-13 LAB — D-DIMER, QUANTITATIVE: D-Dimer, Quant: <0.22 ug/mL-FEU (ref 0.00–0.48)

## 2010-12-13 LAB — TSH: TSH: 0.935 u[IU]/mL (ref 0.350–4.500)

## 2010-12-13 LAB — POCT CARDIAC MARKERS
CKMB, poc: 3 ng/mL (ref 1.0–8.0)
Troponin i, poc: <0.05 ng/mL (ref 0.00–0.09)

## 2010-12-14 ENCOUNTER — Inpatient Hospital Stay (HOSPITAL_COMMUNITY): Payer: Medicare HMO

## 2010-12-14 LAB — DIFFERENTIAL
Basophils Absolute: 0 10*3/uL (ref 0.0–0.1)
Eosinophils Absolute: 0 10*3/uL (ref 0.0–0.7)
Eosinophils Relative: 0 % (ref 0–5)
Lymphs Abs: 0.5 10*3/uL — ABNORMAL LOW (ref 0.7–4.0)
Monocytes Absolute: 0.6 10*3/uL (ref 0.1–1.0)

## 2010-12-14 LAB — BLOOD GAS, ARTERIAL
Acid-base deficit: 1.6 mmol/L (ref 0.0–2.0)
Drawn by: 30996
O2 Content: 2 L/min
O2 Saturation: 94.2 %
Patient temperature: 97.6
TCO2: 20.8 mmol/L (ref 0–100)
pCO2 arterial: 42.5 mmHg (ref 35.0–45.0)

## 2010-12-14 LAB — BASIC METABOLIC PANEL
BUN: 41 mg/dL — ABNORMAL HIGH (ref 6–23)
Calcium: 8.6 mg/dL (ref 8.4–10.5)
GFR calc non Af Amer: 49 mL/min — ABNORMAL LOW (ref >60)
Glucose, Bld: 142 mg/dL — ABNORMAL HIGH (ref 70–99)
Potassium: 4.5 mEq/L (ref 3.5–5.1)

## 2010-12-14 LAB — CBC
MCHC: 32.2 g/dL (ref 30.0–36.0)
MCV: 97.7 fL (ref 78.0–100.0)
Platelets: 312 10*3/uL (ref 150–400)
RDW: 14.5 % (ref 11.5–15.5)
WBC: 17.1 10*3/uL — ABNORMAL HIGH (ref 4.0–10.5)

## 2010-12-15 ENCOUNTER — Inpatient Hospital Stay (HOSPITAL_COMMUNITY): Payer: Medicare HMO

## 2010-12-15 DIAGNOSIS — I4891 Unspecified atrial fibrillation: Secondary | ICD-10-CM

## 2010-12-15 LAB — CBC
HCT: 44 % (ref 39.0–52.0)
MCH: 31.4 pg (ref 26.0–34.0)
MCHC: 32.7 g/dL (ref 30.0–36.0)
MCV: 96.1 fL (ref 78.0–100.0)
Platelets: 332 10*3/uL (ref 150–400)
RDW: 14.1 % (ref 11.5–15.5)
WBC: 17.9 10*3/uL — ABNORMAL HIGH (ref 4.0–10.5)

## 2010-12-15 LAB — BASIC METABOLIC PANEL
BUN: 49 mg/dL — ABNORMAL HIGH (ref 6–23)
Calcium: 8.9 mg/dL (ref 8.4–10.5)
Creatinine, Ser: 1.79 mg/dL — ABNORMAL HIGH (ref 0.4–1.5)
GFR calc non Af Amer: 38 mL/min — ABNORMAL LOW (ref >60)
Glucose, Bld: 143 mg/dL — ABNORMAL HIGH (ref 70–99)
Potassium: 4 mEq/L (ref 3.5–5.1)

## 2010-12-16 LAB — CBC
HCT: 41.4 % (ref 39.0–52.0)
MCHC: 33.3 g/dL (ref 30.0–36.0)
Platelets: 316 10*3/uL (ref 150–400)
RDW: 14.3 % (ref 11.5–15.5)
WBC: 15.9 10*3/uL — ABNORMAL HIGH (ref 4.0–10.5)

## 2010-12-16 LAB — BASIC METABOLIC PANEL
BUN: 54 mg/dL — ABNORMAL HIGH (ref 6–23)
Calcium: 8.4 mg/dL (ref 8.4–10.5)
GFR calc non Af Amer: 34 mL/min — ABNORMAL LOW (ref >60)
Glucose, Bld: 149 mg/dL — ABNORMAL HIGH (ref 70–99)
Potassium: 4.2 mEq/L (ref 3.5–5.1)
Sodium: 135 mEq/L (ref 135–145)

## 2010-12-16 LAB — PROTIME-INR: Prothrombin Time: 12.6 seconds (ref 11.6–15.2)

## 2010-12-17 LAB — BASIC METABOLIC PANEL
CO2: 22 mEq/L (ref 19–32)
Calcium: 8.5 mg/dL (ref 8.4–10.5)
Chloride: 105 mEq/L (ref 96–112)
Creatinine, Ser: 1.76 mg/dL — ABNORMAL HIGH (ref 0.4–1.5)
Glucose, Bld: 136 mg/dL — ABNORMAL HIGH (ref 70–99)
Sodium: 135 mEq/L (ref 135–145)

## 2010-12-17 LAB — PROTIME-INR: Prothrombin Time: 12.9 seconds (ref 11.6–15.2)

## 2010-12-19 LAB — PROTIME-INR: Prothrombin Time: 19.5 seconds — ABNORMAL HIGH (ref 11.6–15.2)

## 2010-12-20 NOTE — H&P (Signed)
Stephen [name], Stephen [name]              ACCOUNT NO.:  [id number]  MEDICAL RECORD NO.:  [id number]           PATIENT TYPE:  I  LOCATION:  1424                         FACILITY:  [facility]  PHYSICIAN:  Della Goo, M.D. DATE OF BIRTH:  09-16-1941  DATE OF ADMISSION:  12/12/2010 DATE OF DISCHARGE:                             HISTORY & PHYSICAL   DATE OF ADMISSION:  December 13, 2010.  PRIMARY CARE PHYSICIAN:  Dr. Gwen Pounds, [facility].  CHIEF COMPLAINT:  Shortness of breath.  HISTORY OF PRESENT ILLNESS:  This is a 70 year old male, who presents to the emergency department with complaints of worsening shortness of breath over the past week.  Patient reports having chest tightness, wheezing.  He denies having any orthopnea or edema.  Patient did complain of having chest pain.  Patient states his symptoms began when he had been on the golf course 1 week ago and the grounds had been treated with some type of chemical.  He began to have symptoms, which were worsening and went to see his primary care doctor a few days later and was treated with outpatient prednisone therapy; however, his symptoms continued to worsen and over the past 24 hours, he reports having increased cough and back pain as well as worsening shortness of breath.  Patient was seen and evaluated in the emergency department.  He was found to be hypoxic initially and tachycardic and tachypneic and was treated with nebulizer treatments and steroid therapy and had minimal relief.  He was referred for medical admission.  Chest x-ray did not reveal pneumonia.  The patient reported having chest and rib pain and heard a pop when he stood up from the chair.  He had pain in the left rib areas and flank area and the pain was worse with coughing.  PAST MEDICAL HISTORY:  Significant for: 1. COPD. 2. Hypertension. 3. Paroxysmal atrial fibrillation.  PAST SURGICAL HISTORY:  Tonsillectomy.  MEDICATIONS:  Include: 1. Spiriva  handihaler. 2. Advair Diskus. 3. Xopenex nebulizer treatments. 4. Albuterol inhaler. 5. The outpatient prednisone taper. 6. Patient also reports being on blood pressure medications.  These     will need to be further verified.  ALLERGIES: 1. PENICILLIN. 2. Patient has an adverse reaction and intolerance to ALBUTEROL     nebulizer treatments.  He reports that this worsened his atrial     fibrillation.  SOCIAL HISTORY:  Patient is married.  He is a former smoker.  He quit 20 years ago.  He reports he drinks occasionally.  He denies any illicit drug usage.  FAMILY HISTORY:  Noncontributory.  REVIEW OF SYSTEMS:  Pertinent as mentioned above.  PHYSICAL EXAMINATION FINDINGS:  GENERAL:  This is a 70 year old elderly Caucasian gentleman, who is well-nourished, well-developed and in mild distress. VITAL SIGNS:  Temperature 97.3.  He is tremulous.  Blood pressure is 173/92; heart rate 111; respirations 32, initially now 24; O2 sats at the lowest 90% and at the highest at 93% on supplemental oxygen. HEENT EXAMINATION:  Normocephalic, atraumatic.  Pupils equally round and reactive to light.  Extraocular movements are intact.  Funduscopic benign.  There is no scleral  icterus.  Nares are patent bilaterally. Oropharynx is clear. NECK:  Supple.  Full range of motion.  No thyromegaly, adenopathy or jugular venous distention. CARDIOVASCULAR:  Regular rate and rhythm.  No murmurs, gallops or rubs appreciated at this time. LUNGS:  With decreased breath sounds bilaterally.  There are no rales, no rhonchi and there are no wheezes. ABDOMEN:  Positive bowel sounds, soft, distended, abdomen nontender to palpation.  There is no hepatosplenomegaly. EXTREMITIES:  Without cyanosis, clubbing or edema. NEUROLOGIC EXAMINATION:  Nonfocal.  LABORATORY STUDIES:  White blood cell count 18.5, hemoglobin 15.6, hematocrit 46.7, platelets 357,000, neutrophils 92%, lymphocytes 4%. Sodium 138, potassium 4.9,  chloride 111, CO2 of 19, BUN 50, creatinine 1.9 and glucose 138.  Point of care cardiac markers with a myoglobin of 367, CK-MB 3.0 and troponin less than 0.05, D-dimer less than 0.22.  DIAGNOSTIC STUDIES:  X-rays of the chest, three views reveal several nonacute left-sided rib fractures.  Chest x-ray reveals COPD changes, anterior basilar atelectasis, multiple thoracic spine compression fractures.  EKG reveals a normal sinus rhythm, which is mildly tachycardic at a rate of 101.  There are no acute ST-segment changes.  ASSESSMENT:  A 70 year old male being admitted with: 1. Short shortness of breath. 2. Hypoxemia. 3. Chronic obstructive pulmonary disease exacerbation. 4. Hypertension. 5. History of paroxysmal atrial fibrillation. 6. Subacute compression fractures. 7. Several subacute left-sided rib fractures.  PLAN:  Patient will be admitted to the telemetry area for monitoring. Cardiac enzymes will continue to be performed.  Patient will be placed on IV Solu-Medrol therapy that will be tapered.  Xopenex and Atrovent nebulizer treatments have been ordered as well with supplemental oxygen.  His regular medications will be further verified and patient will also be monitored for possible withdrawal symptoms.  The CIWA protocol will be initiated.  Patient is a full code.     Della Goo, M.D.     HJ/MEDQ  D:  12/13/2010  T:  12/13/2010  Job:  [id number]  cc:   Dr. Gwen Pounds  Electronically Signed by Della Goo M.D. on 12/20/2010 06:11:58 AM

## 2010-12-21 ENCOUNTER — Ambulatory Visit (INDEPENDENT_AMBULATORY_CARE_PROVIDER_SITE_OTHER): Payer: Self-pay | Admitting: Internal Medicine

## 2010-12-21 ENCOUNTER — Telehealth: Payer: Self-pay | Admitting: *Deleted

## 2010-12-21 DIAGNOSIS — Z7901 Long term (current) use of anticoagulants: Secondary | ICD-10-CM | POA: Insufficient documentation

## 2010-12-21 DIAGNOSIS — I4891 Unspecified atrial fibrillation: Secondary | ICD-10-CM

## 2010-12-21 LAB — POCT INR: INR: 2.2

## 2010-12-21 NOTE — Telephone Encounter (Signed)
Tamsen Roers calling from [facility] needing a verbal order for treatment.  Please return call to [phone], advised provider was out of office.

## 2010-12-21 NOTE — Telephone Encounter (Signed)
Scott at [facility] has a referral on this pt from the hospital and needs a call back to ok all his verbal orders, please.

## 2010-12-22 ENCOUNTER — Emergency Department (HOSPITAL_COMMUNITY)
Admission: EM | Admit: 2010-12-22 | Discharge: 2010-12-22 | Disposition: A | Discharge status: Home / Self Care | Payer: Medicare HMO | Attending: Emergency Medicine | Admitting: Emergency Medicine

## 2010-12-22 DIAGNOSIS — J449 Chronic obstructive pulmonary disease, unspecified: Secondary | ICD-10-CM | POA: Insufficient documentation

## 2010-12-22 DIAGNOSIS — I4891 Unspecified atrial fibrillation: Secondary | ICD-10-CM | POA: Insufficient documentation

## 2010-12-22 DIAGNOSIS — I10 Essential (primary) hypertension: Secondary | ICD-10-CM | POA: Insufficient documentation

## 2010-12-22 DIAGNOSIS — R339 Retention of urine, unspecified: Secondary | ICD-10-CM | POA: Insufficient documentation

## 2010-12-22 DIAGNOSIS — J4489 Other specified chronic obstructive pulmonary disease: Secondary | ICD-10-CM | POA: Insufficient documentation

## 2010-12-22 DIAGNOSIS — Z7901 Long term (current) use of anticoagulants: Secondary | ICD-10-CM | POA: Insufficient documentation

## 2010-12-22 LAB — URINALYSIS, ROUTINE W REFLEX MICROSCOPIC
Glucose, UA: NEGATIVE mg/dL
Nitrite: NEGATIVE
Specific Gravity, Urine: 1.013 (ref 1.005–1.030)
pH: 6 (ref 5.0–8.0)

## 2010-12-22 LAB — BASIC METABOLIC PANEL
CO2: 28 mEq/L (ref 19–32)
Calcium: 8.2 mg/dL — ABNORMAL LOW (ref 8.4–10.5)
Creatinine, Ser: 1.37 mg/dL (ref 0.4–1.5)
GFR calc Af Amer: >60 mL/min (ref >60)
GFR calc non Af Amer: 51 mL/min — ABNORMAL LOW (ref >60)
Sodium: 134 mEq/L — ABNORMAL LOW (ref 135–145)

## 2010-12-22 LAB — CBC
Hemoglobin: 14.5 g/dL (ref 13.0–17.0)
MCH: 31.8 pg (ref 26.0–34.0)
Platelets: 249 10*3/uL (ref 150–400)
RBC: 4.56 MIL/uL (ref 4.22–5.81)
WBC: 14.7 10*3/uL — ABNORMAL HIGH (ref 4.0–10.5)

## 2010-12-22 LAB — DIFFERENTIAL
Basophils Absolute: 0 10*3/uL (ref 0.0–0.1)
Basophils Relative: 0 % (ref 0–1)
Eosinophils Absolute: 0 10*3/uL (ref 0.0–0.7)
Monocytes Relative: 7 % (ref 3–12)
Neutro Abs: 13.1 10*3/uL — ABNORMAL HIGH (ref 1.7–7.7)
Neutrophils Relative %: 89 % — ABNORMAL HIGH (ref 43–77)

## 2010-12-22 LAB — PROTIME-INR
INR: 2.14 — ABNORMAL HIGH (ref 0.00–1.49)
Prothrombin Time: 24.1 seconds — ABNORMAL HIGH (ref 11.6–15.2)

## 2010-12-24 LAB — URINE CULTURE: Culture  Setup Time: 201204070105

## 2010-12-25 ENCOUNTER — Telehealth: Payer: Self-pay

## 2010-12-25 NOTE — Telephone Encounter (Signed)
Attempted to call - ans mach for scott with [facility] - lmtcb - need to know what is being done in the home that they need orders for. KIK

## 2010-12-25 NOTE — Telephone Encounter (Signed)
Spoke with scott - per dr. Amador Cunas - ok to do teaching in home and pt/inr monitor. KIK

## 2010-12-26 ENCOUNTER — Ambulatory Visit (INDEPENDENT_AMBULATORY_CARE_PROVIDER_SITE_OTHER): Payer: Self-pay | Admitting: Cardiology

## 2010-12-26 DIAGNOSIS — I4891 Unspecified atrial fibrillation: Secondary | ICD-10-CM

## 2010-12-27 NOTE — Consult Note (Signed)
NAMEALLAN, Stephen [name]              ACCOUNT NO.:  [id number]  MEDICAL RECORD NO.:  [id number]           PATIENT TYPE:  I  LOCATION:  1424                         FACILITY:  [facility]  PHYSICIAN:  Colleen Can. Deborah Chalk, M.D.DATE OF BIRTH:  1941-01-04  DATE OF CONSULTATION:  12/15/2010 DATE OF DISCHARGE:                                CONSULTATION   HISTORY OF PRESENT ILLNESS:  We are asked to see Stephen [name] for evaluation of his atrial fibrillation management and control of his atrial fibrillation as well as possible anticoagulation regimen.  He was in his usual state of health with some degree of chronic COPD and shortness of breath.  He apparently was on golf course when exposed to chemicals and had worsening of his COPD with cough and shortness of breath.  He was treated with outpatient prednisone as well as bronchodilators and with that, he was admitted with a COPD exacerbation. He had paroxysmal atrial fibrillation with rapid ventricular response, but currently is converted back to sinus rhythm.  The chart strongly suggests that he has alcohol abuse, but the patient denies excessive drinking with his daughter in the room.  He does drink a pint of rum that tends to last about every 2 weeks.  He has been on warfarin in the past, but it was discontinued in part because of bruising and not necessarily because of falls.  He does have an abnormal chest x-ray with evidence for subacute fractures of his ribs.  He has had profession as a Surveyor, minerals for many years.  PAST MEDICAL HISTORY:  Significant for: 1. COPD. 2. Hypertension. 3. Paroxysmal atrial fibrillation.  PAST SURGICAL HISTORY:  Tonsillectomy.  CURRENT MEDICATIONS:  Spiriva handheld inhaler, Advair Diskus, Xopenex nebulizer treatment, albuterol inhaler, and outpatient tapering prednisone.  He has been on blood pressure medicines in the past to include Cardizem.  He was on warfarin in 2006.  ALLERGIES:  PENICILLIN.  He  has had an adverse reaction and intolerance to ALBUTEROL.  SOCIAL HISTORY:  He is married.  He is a former smoker, quit 20 years ago.  He drinks occasionally.  Denies any illegal drug use.  FAMILY HISTORY:  Noncontributory.  REVIEW OF SYSTEMS:  He denies alcohol abuse to questioning in several different ways.  He denies any recurrent falls, although he does bruise easily.  PHYSICAL EXAMINATION:  GENERAL:  He currently is a well-developed and well-nourished white male, in no acute distress.  He has a somewhat ruddy complexion. VITAL SIGNS:  Blood pressure is 146/79, heart rate is 97.  There is no jugular venous distention. LUNGS:  Coarse rhonchi.  Rales are present bilaterally. HEART:  Shows a regular rate and rhythm today. ABDOMEN:  Soft and nontender. EXTREMITIES:  Without edema.  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  A 2-D echocardiogram showed normal LV size and normal LV.  OVERALL IMPRESSION: 1. Paroxysmal atrial fibrillation, resolved with improvement of     pulmonary status. 2. Chronic obstructive pulmonary disease exacerbation. 3. Acute renal failure.  PLAN:  We will treat with aspirin low  dose.  We will continue Lovenox for now.  We will continue diltiazem at the current dose, but would change to a long acting form at some point in time.  I have to get an EKG.  I currently would favor not having him on anticoagulation.  Even though he has a chance score 3, he does have a small heart, normal left atrial size and he is at increased risk of being on warfarin anticoagulation.     Colleen Can. Deborah Chalk, M.D.     SNT/MEDQ  D:  12/15/2010  T:  12/16/2010  Job:  [id number]  cc:   Gordy Savers, MD [address] [place] [address]  Electronically Signed by Roger Shelter M.D. on 12/27/2010 11:16:31 AM

## 2010-12-28 NOTE — Discharge Summary (Signed)
Stephen [name], Stephen [name]              ACCOUNT NO.:  [id number]  MEDICAL RECORD NO.:  [id number]           PATIENT TYPE:  I  LOCATION:  1424                         FACILITY:  [facility]  PHYSICIAN:  Osvaldo Shipper, MD     DATE OF BIRTH:  1941/08/25  DATE OF ADMISSION:  12/12/2010 DATE OF DISCHARGE:  12/19/2010                              DISCHARGE SUMMARY   PRIMARY CARE PHYSICIAN:  Gordy Savers, MD with [facility].  CONSULTATION:  The patient was seen in consultation by Dr. Roger Shelter with [facility].  The patient has seen Dr. Jens Som in the past.  STUDIES:  Studies performed during this hospitalization include: 1. Echocardiogram which showed normal EF, grade 1 diastolic     dysfunction, no other significant abnormalities were appreciated. 2. Renal ultrasound showed no hydronephrosis, small bilateral renal     cysts. 3. Chest x-ray initially on December 12, 2010, which showed emphysema and     several nonacute left-sided rib fractures. 4. Chest x-ray on December 13, 2010, which showed COPD with some     atelectasis and thoracic compression deformities. 5. Chest x-ray on December 14, 2010, which again showed COPD without any     acute disease.  DISCHARGE DIAGNOSIS: 1. Chronic obstructive pulmonary disease exacerbation.  The patient is     a 70 year old Caucasian male who has a past history of smoking,     does not smoke anymore, who presented to the hospital with     complaints of shortness of breath and cough.  He was found to have     findings of emphysema on his chest x-ray without any acute     infiltrates.  The patient was put on breathing treatments,     steroids, antibiotics.  He was initially very slow to improve.  He     almost took 3 days to start feeling better and then over the past 3     days, he has  shown considerable improvement.  He is unfortunately     getting hypoxic with ambulation, so he will require home     oxygenation.  He will continue  on nebulizer treatments and steroid     taper.  He has completed his course of antibiotics.  He will follow     up with his PCP. 2. Atrial fibrillation with rapid ventricular response was also     noticed during this admission.  We ordered diltiazem with only     partial controlled.  So we consulted cardiology.  They put him on     amiodarone.  He was started back on warfarin.  He underwent an     echocardiogram as mentioned earlier.  He will need to follow up     with [facility] and this has been called to their office     and they will arrange appointment with Dr. Jens Som.  Per Dr.     Deborah Chalk, the patient will require bridging Lovenox for the next few     days until his INR becomes therapeutic.  The patient has been going     in and out of  sinus rhythm to AFib but predominately stays in sinus     rhythm.  He is on Cardizem and amiodarone. Please note I discussed with Dr. Patty Sermons earlier today. 3. Hypertension, is stable and well controlled. 4. Chronic kidney disease, stage 3.  He will need followup with his     PCP and may require referral to a nephrologist. 5. There was a question as to when he came in the patient was     undergoing alcohol withdrawal.  He did mention to some of the     nurses that he was drinking on a daily basis, however, later on     denied that.  So this issue is not entirely clear.  He did require     some Ativan and Haldol initially but has done well in the last few     days. 6. Thoracic compression fracture and rib fractures.  This will need     outpatient evaluation.  The patient may have osteoporosis, could     have other pathologies causing these fractures.  So I would     encourage the PCP to pursue this further.  Other issues have all been stable.  On the day of discharge, the patient is feeling well.  He is keen on going home.  Denies any new complaints.  He had a bowel movement yesterday.  His vital signs are stable.  Heart rate staying in  the early 90s to occasionally in the 80s, saturation 96% on 2 L.  Lungs are clear to auscultation bilaterally with no wheezing, rales or rhonchi.  He does have some coarse breath sounds.  Cardiovascular, S1 and S2 is normal, regular.  Abdomen is soft, nontender, nondistended.  Neurologically, he is alert, oriented x3.  No focal neurological deficits are present.  ASSESSMENT/PLAN:  As per above.  DISCHARGE MEDICATIONS: 1. Amiodarone 200 mg p.o. b.i.d. 2. Diltiazem CD 360 mg daily. 3. Colace 100 mg twice daily. 4. Lovenox that is enoxaparin 60 mg twice daily for 4 days until INR     is between 2 and 3. 5. MiraLax 17 g daily as needed for constipation. 6. Prednisone taper. 7. Warfarin 5 mg every evening.  INR to be checked on December 21, 2010 by     [facility]. 8. Advair Diskus 250/50 twice daily. 9. Multivitamins 1 tablet daily. 10.Spiriva 1 capsule inhaled every morning. 11.Ventolin inhaler as needed q.4 h 12.Xopenex nebulized 4 times daily.  Please note we have increased the     dose of his diltiazem and we have discontinued the lisinopril for     now.  This was predominately for his renal function.  FOLLOWUP: 1. Follow up with Dr. Amador Cunas.  The patient to call for     appointment to be seen in 2 to 3 weeks.  I have put in a call to     [facility] so that the patient will get an appointment to     see Dr. Jens Som in the next 2 weeks or so. 2. Home health to see the patient for PT/INR on the December 21, 2010.     Home health PT and OT have also been set up.  Home oxygenation has     also been set up.  DIET:  Heart healthy.  PHYSICAL ACTIVITY:  No exertion.  Other instructions have been provided.  TOTAL TIME ON THIS DISCHARGE ENCOUNTER:  35 minutes.   Osvaldo Shipper, MD     GK/MEDQ  D:  12/19/2010  T:  12/19/2010  Job:  [id number]  cc:   Colleen Can. Deborah Chalk, M.D. Fax: [phone]  Gordy Savers, MD [address] [place] [address]  Madolyn Frieze.  Jens Som, MD, [facility] [address]  [address] [place] [address]  Electronically Signed by Osvaldo Shipper MD on 12/28/2010 11:29:52 PM

## 2011-01-02 ENCOUNTER — Ambulatory Visit: Payer: Self-pay | Admitting: Cardiology

## 2011-01-12 ENCOUNTER — Ambulatory Visit (INDEPENDENT_AMBULATORY_CARE_PROVIDER_SITE_OTHER): Payer: Self-pay | Admitting: Internal Medicine

## 2011-01-12 ENCOUNTER — Ambulatory Visit (INDEPENDENT_AMBULATORY_CARE_PROVIDER_SITE_OTHER): Payer: Medicare HMO | Admitting: Cardiology

## 2011-01-12 ENCOUNTER — Encounter: Payer: Self-pay | Admitting: Cardiology

## 2011-01-12 DIAGNOSIS — J449 Chronic obstructive pulmonary disease, unspecified: Secondary | ICD-10-CM

## 2011-01-12 DIAGNOSIS — I10 Essential (primary) hypertension: Secondary | ICD-10-CM

## 2011-01-12 DIAGNOSIS — I4891 Unspecified atrial fibrillation: Secondary | ICD-10-CM

## 2011-01-12 DIAGNOSIS — J4489 Other specified chronic obstructive pulmonary disease: Secondary | ICD-10-CM

## 2011-01-12 LAB — POCT INR: INR: 5.3

## 2011-01-12 MED ORDER — AMIODARONE HCL 200 MG PO TABS: 200.0000 mg | ORAL_TABLET | Freq: Every day | ORAL | Status: DC

## 2011-01-12 NOTE — Assessment & Plan Note (Signed)
Patient remains in sinus rhythm. Decrease amiodarone to 200 mg daily. Continue Coumadin with goal INR 2-3 as he has embolic risk factors of hypertension and age greater than 30. Schedule chest x-ray, TSH and liver functions in 2 months.

## 2011-01-12 NOTE — Patient Instructions (Signed)
Your physician has recommended you make the following change in your medication:  Decrease amiodarone to 200mg  once daily.  Your physician recommends that you return for lab work in: 2 months- tsh, liver (427.31;401.1)  A chest x-ray takes a picture of the organs and structures inside the chest, including the heart, lungs, and blood vessels. This test can show several things, including, whether the heart is enlarges; whether fluid is building up in the lungs; and whether pacemaker / defibrillator leads are still in place.- need in 2 months.  Your physician wants you to follow-up in: 6 months. You will receive a reminder letter in the mail two months in advance. If you Stephen [name]'t receive a letter, please call our office to schedule the follow-up appointment.

## 2011-01-12 NOTE — Progress Notes (Signed)
HPI: 70 year old male for followup of atrial fibrillation. Admitted in March of 2012 with what sounds to be a COPD flare. Patient also in atrial fibrillation and placed on amiodarone and Coumadin. Echocardiogram in March of 2012 showed normal LV function and a trivial pericardial effusion. Since he was discharged, he does have significant dyspnea on exertion. There is no orthopnea, PND, pedal edema, syncope or chest pain. He is more short of breath today but denies fevers, chills or productive cough. He states that he is late for his nebulizer this afternoon. He is on 2 L of home oxygen for his COPD.  Current Outpatient Prescriptions  Medication Sig Dispense Refill  . albuterol (VENTOLIN HFA) 108 (90 BASE) MCG/ACT inhaler Inhale 2 puffs into the lungs every 6 (six) hours as needed.  3 Inhaler  6  . amiodarone (PACERONE) 200 MG tablet Take 200 mg by mouth 2 (two) times daily.        Marland Kitchen diltiazem (CARDIZEM CD) 360 MG 24 hr capsule Take 360 mg by mouth daily.        Tery Sanfilippo Sodium 100 MG capsule Take 100 mg by mouth as needed.        . Fluticasone-Salmeterol (ADVAIR DISKUS) 250-50 MCG/DOSE AEPB Inhale 1 puff into the lungs 2 (two) times daily.  60 each  6  . levalbuterol (XOPENEX) 1.25 MG/0.5ML nebulizer solution Take 0.5 mLs (1.25 mg total) by nebulization every 4 (four) hours as needed.  90 vial  6  . Multiple Vitamin (MULTIVITAMIN) tablet Take 1 tablet by mouth daily.        . polyethylene glycol (MIRALAX / GLYCOLAX) packet Take 17 g by mouth daily.        Marland Kitchen tiotropium (SPIRIVA) 18 MCG inhalation capsule Place 1 capsule (18 mcg total) into inhaler and inhale daily.  30 capsule  6  . DISCONTD: diltiazem (DILACOR XR) 240 MG 24 hr capsule Take 1 capsule (240 mg total) by mouth daily.  90 capsule  6  . DISCONTD: lisinopril (PRINIVIL,ZESTRIL) 20 MG tablet Take 1 tablet (20 mg total) by mouth daily.  90 tablet  6     Past Medical History  Diagnosis Date  . ASTHMA 04/22/2007  . ATRIAL FIBRILLATION,  PAROXYSMAL 05/28/2007  . BENIGN PROSTATIC HYPERTROPHY 04/20/2008  . COPD 05/28/2007  . HYPERTENSION 04/22/2007  . HYPOTENSION 10/10/2010  . 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 Tonsillectomy     History   Social History  . Marital Status: Widowed    Spouse Name: N/A    Number of Children: N/A  . Years of Education: N/A   Occupational History  . Not on file.   Social History Main Topics  . Smoking status: Former Smoker    Quit date: 09/18/1983  . Smokeless tobacco: Not on file  . Alcohol Use: Not on file  . Drug Use: Not on file  . Sexually Active: Not on file   Other Topics Concern  . Not on file   Social History Narrative  . No narrative on file    ROS: no fevers or chills, productive cough, hemoptysis, dysphasia, odynophagia, melena, hematochezia, dysuria, hematuria, rash, seizure activity, orthopnea, PND, pedal edema, claudication. Remaining systems are negative.  Physical Exam: Well-developed well-nourished in mild respiratory distress.  Skin is warm and dry.  HEENT is normal.  Neck is supple. No thyromegaly.  Chest is diminished breath sounds throughout with mild expiratory wheeze Cardiovascular exam is regular  rate and rhythm.  Abdominal exam nontender or distended. No masses palpated. Extremities show no edema. neuro grossly intact  ECG Normal sinus rhythm at a rate of 96. Left axis deviation. No ST changes.

## 2011-01-12 NOTE — Assessment & Plan Note (Signed)
Blood pressure controlled with present medications. Will continue. 

## 2011-01-12 NOTE — Assessment & Plan Note (Signed)
Severe COPD. Managed by primary care. He is short of breath today but feels a nebulized treatment at home will be sufficient. I offered to have him sent to the emergency room but he feels he is okay with home therapy.

## 2011-01-15 NOTE — Patient Instructions (Signed)
01/15/11- Was able to reach pt today, he did receive our instructions and held 2 doses of coumadin and restarted yesterday with 2 tablets daily. Orders sent to [facility] with instructions to draw INR on Friday.

## 2011-01-19 ENCOUNTER — Ambulatory Visit: Payer: Self-pay | Admitting: Cardiology

## 2011-01-19 LAB — POCT INR: INR: 3.8

## 2011-01-19 MED ORDER — WARFARIN SODIUM 2.5 MG PO TABS: ORAL_TABLET | ORAL | Status: DC

## 2011-01-26 ENCOUNTER — Encounter: Payer: Self-pay | Admitting: Internal Medicine

## 2011-01-26 ENCOUNTER — Ambulatory Visit: Payer: Self-pay | Admitting: Cardiology

## 2011-01-26 ENCOUNTER — Ambulatory Visit (INDEPENDENT_AMBULATORY_CARE_PROVIDER_SITE_OTHER): Payer: Medicare HMO | Admitting: Internal Medicine

## 2011-01-26 DIAGNOSIS — I4891 Unspecified atrial fibrillation: Secondary | ICD-10-CM

## 2011-01-26 DIAGNOSIS — R05 Cough: Secondary | ICD-10-CM

## 2011-01-26 DIAGNOSIS — J449 Chronic obstructive pulmonary disease, unspecified: Secondary | ICD-10-CM

## 2011-01-26 DIAGNOSIS — I10 Essential (primary) hypertension: Secondary | ICD-10-CM

## 2011-01-26 LAB — POCT INR: INR: 2.9

## 2011-01-26 MED ORDER — METHYLPREDNISOLONE ACETATE 80 MG/ML IJ SUSP: 80.0000 mg | Freq: Once | INTRAMUSCULAR | Status: DC

## 2011-01-26 MED ORDER — AZITHROMYCIN 250 MG PO TABS: ORAL_TABLET | ORAL | Status: AC

## 2011-01-26 NOTE — Progress Notes (Signed)
  Subjective:    Patient ID: Stephen [name], male    DOB: [birth year], 70 y.o.   MRN: [id number]  HPI  70 year old patient has a history of advanced COPD oxygen dependent. For the past few days has had increasing shortness of breath nonproductive cough and general malaise. No fever or purulent sputum production he has noted some increasing wheezing and chest congestion. Remains on chronic oxygen 2 L per minute. His current medications were reviewed. This includes Xopenex nebulizer treatments every 4-6 hours. He has paroxysmal atrial fibrillation and is on chronic Coumadin anticoagulation denies any palpitations   Review of Systems  Constitutional: Positive for fatigue. Negative for fever, chills and appetite change.  HENT: Negative for hearing loss, ear pain, congestion, sore throat, trouble swallowing, neck stiffness, dental problem, voice change and tinnitus.   Eyes: Negative for pain, discharge and visual disturbance.  Respiratory: Positive for cough, shortness of breath and wheezing. Negative for chest tightness and stridor.   Cardiovascular: Negative for chest pain, palpitations and leg swelling.  Gastrointestinal: Negative for nausea, vomiting, abdominal pain, diarrhea, constipation, blood in stool and abdominal distention.  Genitourinary: Negative for urgency, hematuria, flank pain, discharge, difficulty urinating and genital sores.  Musculoskeletal: Negative for myalgias, back pain, joint swelling, arthralgias and gait problem.  Skin: Negative for rash.  Neurological: Negative for dizziness, syncope, speech difficulty, weakness, numbness and headaches.  Hematological: Negative for adenopathy. Does not bruise/bleed easily.  Psychiatric/Behavioral: Negative for behavioral problems and dysphoric mood. The patient is not nervous/anxious.        Objective:   Physical Exam  Constitutional: He is oriented to person, place, and time. He appears well-developed and well-nourished. No distress.         Appears chronically ill but in no acute distress. Blood pressure 120/80 Temp 98.1   HENT:  Head: Normocephalic.  Right Ear: External ear normal.  Left Ear: External ear normal.  Eyes: Conjunctivae and EOM are normal.  Neck: Normal range of motion.  Cardiovascular: Normal rate and normal heart sounds.   Pulmonary/Chest: Effort normal.       O2 saturation 93% Pulse rate 100  Scattered coarse rhonchi and faint expiratory wheezing No increased work of breathing  Abdominal: Bowel sounds are normal.  Musculoskeletal: Normal range of motion. He exhibits no edema and no tenderness.  Neurological: He is alert and oriented to person, place, and time.  Psychiatric: He has a normal mood and affect. His behavior is normal.          Assessment & Plan:   Exacerbation of COPD. We'll treat with expectorants antibiotic therapy. Hypertension stable Paroxysmal atrial fibrillation stable

## 2011-01-26 NOTE — Progress Notes (Signed)
Addended by: Kern Reap on: 01/26/2011 01:27 PM   Modules accepted: Orders

## 2011-01-26 NOTE — Patient Instructions (Signed)
mucinex-  One twice daily  Take your antibiotic as prescribed until ALL of it is gone, but stop if you develop a rash, swelling, or any side effects of the medication.  Contact our office as soon as possible if  there are side effects of the medication.  Call or return to clinic prn if these symptoms worsen or fail to improve as anticipated.  Return in 3 months for follow-up

## 2011-01-31 ENCOUNTER — Telehealth: Payer: Self-pay | Admitting: *Deleted

## 2011-01-31 ENCOUNTER — Telehealth: Payer: Self-pay | Admitting: Internal Medicine

## 2011-01-31 MED ORDER — PREDNISONE (PAK) 5 MG PO TABS: 5.0000 mg | ORAL_TABLET | Freq: Every day | ORAL | Status: DC

## 2011-01-31 NOTE — Telephone Encounter (Signed)
Received rx  For prednisone pack 5 mg. (it comes in 21 tablets). Please clarify directions. Call [phone]---[facility]-------[address]

## 2011-01-31 NOTE — Telephone Encounter (Signed)
Spoke with pt - informed of prednisone sent to [facility]. If no better please call or make appt., KIK

## 2011-01-31 NOTE — Telephone Encounter (Signed)
Spoke with pharmacy - tapering 6 day dose pak. KIK

## 2011-01-31 NOTE — Telephone Encounter (Signed)
Pt calls stating he is having more SOB with the least bit of exertion.  Productive cough. No fever. Is asking for RX

## 2011-01-31 NOTE — Telephone Encounter (Signed)
As dose pack 5 mg 6 day .  please call into patient's drug store and notify patient

## 2011-02-02 ENCOUNTER — Ambulatory Visit (INDEPENDENT_AMBULATORY_CARE_PROVIDER_SITE_OTHER): Payer: Self-pay | Admitting: Cardiology

## 2011-02-02 DIAGNOSIS — R0989 Other specified symptoms and signs involving the circulatory and respiratory systems: Secondary | ICD-10-CM

## 2011-02-02 NOTE — H&P (Signed)
NAMEMAINOR, HELLMANN              ACCOUNT NO.:  [id number]   MEDICAL RECORD NO.:  [id number]          PATIENT TYPE:  EMS   LOCATION:  ED                           FACILITY:  [facility]   PHYSICIAN:  Valetta Mole. Swords, M.D. [facility] OF BIRTH:  10-12-1940   DATE OF ADMISSION:  07/10/2005  DATE OF DISCHARGE:                                HISTORY & PHYSICAL   HISTORY OF PRESENT ILLNESS:  Mr. Burandt is a 70 year old male with  several-day history of progressive shortness of breath with cough.  Cough is  nonproductive.  He has not had any fevers or chills.  The patient sought  medical attention only today in the emergency department.  He did not make a  phone call to his primary care physician.  He states he was at the beach  this weekend.  Was short of breath at that time. On Monday he was short of  breath but wanted to keep some appointments.  Today he became more short of  breath and found it convenient to come to the emergency department.  He  denies any sweats.  He denies any chest pain.  Cough is nonproductive.   PAST MEDICAL HISTORY:  1.  Asthma which I really think is COPD.  2.  Tobacco use.  3.  He has hypertension.   MEDICATIONS:  Include:  Advair, albuterol, lisinopril.  He does not know the  dose of any of them.   ALLERGIES:  Include:  PENICILLIN.   SOCIAL HISTORY:  Quit smoking 12 years ago.  Drinks alcohol rarely.   REVIEW OF SYSTEMS:  He denies any chest pain, PND, orthopnea, abdominal  pain, loss of appetite, GI blood loss.   PHYSICAL EXAMINATION:  VITAL SIGNS:  Temperature 98.7, heart rate 126.  Initial blood pressure 206/121.  At the time of my examination, heart rate  is 116, blood pressure 150/80, respirations 26.  Initial respirations 32-40.  GENERAL:  He appears as a well-developed, well-nourished male in no acute  distress.  Mild respiratory distress.  Rare accessory muscle used with  talking.  HEENT: Atraumatic, normocephalic. Extraocular muscles are  intact.  NECK:  Supple without lymphadenopathy, thyromegaly, jugular venous  distension, or carotid bruits.  CHEST:  Decreased breath sounds throughout.  He has rare wheeze with forced  expiration.  He is tachypneic.  CARDIAC:  Distant heart sounds.  S1 and S2 are regular.  PMI is not  palpable.  No murmur.  ABDOMEN:  Overweight.  Bowel sounds soft, nontender.  There is no  hepatosplenomegaly.  EXTREMITIES:  No clubbing, cyanosis or edema.Marland Kitchen  NEUROLOGIC:  Alert and oriented without any motor or sensory deficits.   LABORATORY DATA:  None have been drawn.  Chest x-ray demonstrates possible  right lower lobe infiltrate versus atelectasis.   ASSESSMENT/PLAN:  1.  Chronic obstructive pulmonary disease exacerbation with abnormal chest x-      ray demonstrating chronic obstructive pulmonary disease and possible      pneumonia.  The patient with respiratory failure.  Oxygen saturation 90%      on room air.  The patient is quite tachypneic.  I think his symptoms are      explained by chronic obstructive pulmonary disease exacerbation,      possibly infectious.  Will treat with antibiotics, steroids, nebulizers,      and oxygen.  I am concerned that this may not all be infectious or      inflammatory.  Will check a CT scan to rule out pulmonary embolus.  2.  Hypertension.  Will resume lisinopril at 20 mg a day (home dose not      known).      Bruce Rexene Edison Swords, M.D. [facility]  Electronically Signed     BHS/MEDQ  D:  07/11/2005  T:  07/11/2005  Job:  [id number]   cc:   Gordy Savers, M.D. [facility]  [address] [place]  [place] [address]

## 2011-02-02 NOTE — Consult Note (Signed)
NAMEJOSEPHANTHONY, TINDEL NO.:  [id number]   MEDICAL RECORD NO.:  [id number]          PATIENT TYPE:  INP   LOCATION:  3732                         FACILITY:  [facility]   PHYSICIAN:  Olga Millers, M.D. [facility] OF BIRTH:  Jan 06, 1941   DATE OF CONSULTATION:  07/16/2005  DATE OF DISCHARGE:                                   CONSULTATION   HISTORY OF PRESENT ILLNESS:  Mr. Kochan is a 70 year old male with a  past medical history of COPD, hypertension, benign prostatic hypertrophy,  who presents with a COPD flare and we were asked to evaluate for atrial  fibrillation.  He has no prior cardiac history.  He does have a history of  dyspnea on exertion but there is no orthopnea, PND, pedal edema,  palpitations, presyncope, syncope, or exertional chest pain.  He was  admitted to [facility] on July 10, 2005, with increased  shortness of breath and cough.  He was diagnosed with COPD exacerbation.  He  did have a CT of his chest on July 11, 2005.  He had patchy diffuse  bilateral opacities compatible with infectious etiology.  He also had mildly  enlarged bilateral hilar lymph nodes and moderate to severe emphysema.  There was no pulmonary embolus.  He has been treated with steroids,  antibiotics, and bronchodilators.  It should also be noted that he had an  episode of asymptomatic atrial fibrillation treated with Cardizem.  He had  recurrent atrial fibrillation this morning and cardiology is now asked to  evaluate.  He does not have increased dyspnea, chest pain, or palpitations  with his atrial fibrillation.   CURRENT MEDICATIONS:  His medications at present include Avelox, Lisinopril  20 mg p.o. daily, Advair, prednisone, Cardizem 360 mg p.o. daily, potassium,  Albuterol, Atrovent.   ALLERGIES:  He is allergic to penicillin.   SOCIAL HISTORY:  He has remote history of tobacco use, smoking two packs per  day, but he has not smoked in 12 years.  He occasionally  consumes alcohol.   FAMILY HISTORY:  Negative for coronary artery disease.   PAST MEDICAL HISTORY:  There is no diabetes mellitus or hyperlipidemia but  there is hypertension.  He does have significant COPD.  He has a history of  benign prostatic hypertrophy.  He has had a previous tonsillectomy but no  other surgeries are noted.   REVIEW OF SYMPTOMS:  He denies any headaches.  He has not had fevers or  chills.  He has had a productive cough but no hemoptysis.  He denies any  dysphagia, odynophagia, melena, or hematochezia.  There is no dysuria or  hematuria.  There is no rash or seizure activity.  There is no orthopnea,  PND, or pedal edema.  The remaining systems are negative.   PHYSICAL EXAMINATION:  VITAL SIGNS:  Blood pressure 118/80, pulse 147, he is afebrile.  GENERAL:  Well developed, well nourished, in mild respiratory distress.  He  does not appear to be depressed.  SKIN:  Warm and dry.  There is no peripheral clubbing.  HEENT:  Unremarkable with normal eyelids.  NECK:  Supple with a normal upstroke bilaterally and there are no bruits  noted.  There is no jugular venous distention and I cannot appreciate  thyromegaly.  CHEST:  Diminished breath sounds throughout with diffuse expiratory  wheezing.  His AP diameter is increased.  CARDIOVASCULAR:  Distant heart sounds.  It is tachycardic and irregular, but  there are no murmurs noted.  I cannot appreciate an S4.  ABDOMEN:  Nontender, nondistended, positive bowel sounds, no  hepatosplenomegaly, no masses appreciated.  There is no abdominal bruit.  He  has 2+ femoral pulses bilaterally and no bruits.  EXTREMITIES:  No edema, I can palpate no cords.  He has 2+ dorsalis pedes  pulses bilaterally.  NEUROLOGICAL:  Grossly intact.   LABORATORY DATA:  His electrocardiogram shows atrial fibrillation with a  rapid ventricular response.  There are nonspecific ST changes.  His white  blood cell count was 14.7 on July 11, 2005.   Hemoglobin 16.5 with  hematocrit 48.8.  Platelet count 283.  His sodium on October 28 was 140 with  potassium 3.4, BUN 21, creatinine 1.4.  He has had three cardiac enzymes  that showed no evidence of cardiac injury.   DIAGNOSIS:  1.  Paroxysmal atrial fibrillation.  2.  Severe chronic obstructive pulmonary disease.  3.  Hypertension.  4.  Benign prostatic hypertrophy.   PLAN:  Mr. Offord has been admitted with a COPD flare and has severe  lung disease based on his chest CT and also on physical examination.  We  will plan to check a TSH to exclude hyperthyroidism and also an  echocardiogram to quantify his left ventricular function.  I think his  atrial fibrillation is most likely secondary to his pulmonary status/COPD  exacerbation.  I agree with discontinuing his Albuterol and changing to  Xopenex for cardiac stimulation.  He has embolic risk factors of age 80 and  hypertension.  I would, therefore, add Coumadin to his medical regimen with  a goal INR of 2-3.  He will need this long term as he does have asymptomatic  recurrent atrial fibrillation.  His rate remains elevated and I would  increase his Cardizem CD to 480 mg p.o. daily.  If his rate remains elevated  despite the higher dose of Cardizem, then we could add digoxin.  I would not  treat with beta blockade as he has significant COPD and also has wheezing on  exam.  I would discontinue his Lisinopril to make room for higher doses of  AV nodal blocking agents.  We will be happy to follow while he is in the  hospital.           ______________________________  Olga Millers, M.D. [facility]     BC/MEDQ  D:  07/16/2005  T:  07/16/2005  Job:  [id number]

## 2011-02-02 NOTE — Discharge Summary (Signed)
NAMEJOAHAN, SWATZELL              ACCOUNT NO.:  [id number]   MEDICAL RECORD NO.:  [id number]          PATIENT TYPE:  INP   LOCATION:  3732                         FACILITY:  [facility]   PHYSICIAN:  Bruce Rexene Edison. Swords, M.D. [facility] OF BIRTH:  [birth year]   DATE OF ADMISSION:  07/11/2005  DATE OF DISCHARGE:  07/18/2005                                 DISCHARGE SUMMARY   DISCHARGE DIAGNOSES:  1.  Chronic obstructive pulmonary disease exacerbation with right lower lobe      pneumonia.  2.  Paroxysmal atrial fibrillation with rapid ventricular response.  3.  Renal insufficiency, likely chronic.   HISTORY OF PRESENT ILLNESS:  The patient is a 70 year old male who presented  on admission with complaints of progressive shortness of breath with cough  which was nonproductive. The patient was found to have probable right lower  lobe pneumonia noted on chest x-ray and was admitted for further evaluation  and evaluation.   PAST MEDICAL HISTORY:  1.  Asthma/COPD.  2.  Tobacco abuse.  3.  Hypertension.   COURSE OF HOSPITALIZATION:   PROBLEM #1:  COPD exacerbation with probable right lower lobe pneumonia. The  patient was admitted and placed on O2 secondary to mild hypoxia. A CT  angiogram was performed of the chest which was negative for PE, but did show  patchy opacities likely secondary to infectious etiology as well as  bronchitis. The patient was initially started on Avelox, however after six  days on Avelox the patient developed urticaria and Avelox was discontinued.  The urticaria resolved.   PROBLEM #2:  Paroxysmal atrial fibrillation with rapid ventricular response.  During this admission the patient was noted to develop atrial fibrillation.  A cardiac consult was obtained. The patient was initially placed on IV  Cardizem which was later changed to p.o. Cardizem. At the time of discharge  the patient is currently in sinus rhythm with a heart rate in the low 100s  likely secondary to  hypoxia.  The patient will be discharged home on daily  Coumadin which will initially be managed at the [facility] and the  patient will also be discharged home on O2 two liters nasal cannula as he  was noted to have an O2 sat of 89% to 91% after walking on room air. The  patient will be placed on continuous O2 two liters nasal cannula.   LABORATORIES AT DISCHARGE:  TSH is 0.668. INR 1.1. Potassium 3.7. BUN 38  with creatinine of 1.8.   A 2-D echo showed a left ventricular ejection fraction of 75% to 85%.   MEDICATIONS AT DISCHARGE:  1.  Advair 250/50 one puff b.i.d.  2.  Xopenex MDI two puffs with spacer q.6h. p.r.n.; prescription provided      per one MDI.  3.  Atrovent inhaler with spacer q.6h. p.r.n.; prescription provided per one      MDI.  4.  Cardizem CD 480 mg p.o. once daily; prescription provided for 30      tablets.  5.  Prednisone 20 mg p.o. on November 2nd and November 3rd, and then  decrease to 10 mg on November 4th and then discontinue. Prescription      provided for five tablets.  6.  Coumadin 7.5 mg p.o. daily; prescription provided for 30 tablets at 2.5      mg.   FOLLOWUP:  The patient is to follow up with Dr. Amador Cunas on November 6th  at 10:30 a.m. At this visit he will need re-evaluation of his renal function  as well as his O2 saturations on room air as the patient is being sent home  with home O2. In addition the patient is to follow up in the [facility] Clinic  on Friday, November 3rd, at 2:40 p.m. for a PT-INR.      Melissa S. Peggyann Juba, NP      Valetta Mole. Swords, M.D. [facility]  Electronically Signed    MSO/MEDQ  D:  07/18/2005  T:  07/18/2005  Job:  [id number]   cc:   Gordy Savers, M.D. [facility]  [address] [place]  [place] [address]

## 2011-02-08 ENCOUNTER — Telehealth: Payer: Self-pay | Admitting: Internal Medicine

## 2011-02-08 MED ORDER — PREDNISOLONE 5 MG PO TABS: 5.0000 mg | ORAL_TABLET | Freq: Two times a day (BID) | ORAL | Status: AC

## 2011-02-08 NOTE — Telephone Encounter (Signed)
Spoke with pt - informed of rx sent to [facility]. KIK

## 2011-02-08 NOTE — Telephone Encounter (Signed)
Prednisone 5 mg #20 one twice a day for 10 days

## 2011-02-08 NOTE — Telephone Encounter (Signed)
Triage vm-------was seen about 2 weeks ago and was given a rx for Prednisone. It worked some. Still having shortness of breath, because he is out of this med. Would like refill. Refuses ov. He uses [facility]

## 2011-02-09 ENCOUNTER — Ambulatory Visit (INDEPENDENT_AMBULATORY_CARE_PROVIDER_SITE_OTHER): Payer: Self-pay | Admitting: Cardiology

## 2011-02-09 DIAGNOSIS — R0989 Other specified symptoms and signs involving the circulatory and respiratory systems: Secondary | ICD-10-CM

## 2011-02-09 LAB — POCT INR: INR: 3

## 2011-02-16 ENCOUNTER — Ambulatory Visit (INDEPENDENT_AMBULATORY_CARE_PROVIDER_SITE_OTHER): Payer: Self-pay | Admitting: Cardiology

## 2011-02-16 DIAGNOSIS — R0989 Other specified symptoms and signs involving the circulatory and respiratory systems: Secondary | ICD-10-CM

## 2011-02-16 LAB — POCT INR: INR: 1.9

## 2011-02-23 ENCOUNTER — Other Ambulatory Visit: Payer: Medicare HMO

## 2011-02-23 ENCOUNTER — Other Ambulatory Visit: Payer: Self-pay | Admitting: Internal Medicine

## 2011-02-23 DIAGNOSIS — I4891 Unspecified atrial fibrillation: Secondary | ICD-10-CM

## 2011-02-23 DIAGNOSIS — I10 Essential (primary) hypertension: Secondary | ICD-10-CM

## 2011-02-26 ENCOUNTER — Ambulatory Visit: Payer: Self-pay | Admitting: Cardiology

## 2011-03-05 ENCOUNTER — Ambulatory Visit: Payer: Self-pay | Admitting: Internal Medicine

## 2011-03-05 ENCOUNTER — Telehealth: Payer: Self-pay

## 2011-03-05 NOTE — Telephone Encounter (Signed)
Pt requesting handicap placecard -  Will have dr. Amador Cunas fill out and he can come by and pick up tomorrow. kik

## 2011-03-12 ENCOUNTER — Ambulatory Visit (INDEPENDENT_AMBULATORY_CARE_PROVIDER_SITE_OTHER): Payer: Self-pay | Admitting: Cardiology

## 2011-03-12 DIAGNOSIS — R0989 Other specified symptoms and signs involving the circulatory and respiratory systems: Secondary | ICD-10-CM

## 2011-03-12 LAB — POCT INR: INR: 2

## 2011-03-19 ENCOUNTER — Ambulatory Visit (INDEPENDENT_AMBULATORY_CARE_PROVIDER_SITE_OTHER): Payer: Self-pay | Admitting: Cardiology

## 2011-03-19 DIAGNOSIS — R0989 Other specified symptoms and signs involving the circulatory and respiratory systems: Secondary | ICD-10-CM

## 2011-03-19 LAB — POCT INR: INR: 1.8

## 2011-04-02 ENCOUNTER — Ambulatory Visit (INDEPENDENT_AMBULATORY_CARE_PROVIDER_SITE_OTHER): Payer: Self-pay | Admitting: *Deleted

## 2011-04-02 DIAGNOSIS — R0989 Other specified symptoms and signs involving the circulatory and respiratory systems: Secondary | ICD-10-CM

## 2011-04-02 LAB — POCT INR: INR: 2.1

## 2011-04-10 ENCOUNTER — Other Ambulatory Visit: Payer: Self-pay | Admitting: Internal Medicine

## 2011-04-16 ENCOUNTER — Ambulatory Visit (INDEPENDENT_AMBULATORY_CARE_PROVIDER_SITE_OTHER): Payer: Self-pay | Admitting: Cardiology

## 2011-04-16 DIAGNOSIS — R0989 Other specified symptoms and signs involving the circulatory and respiratory systems: Secondary | ICD-10-CM

## 2011-04-19 ENCOUNTER — Ambulatory Visit: Payer: Self-pay | Admitting: Internal Medicine

## 2011-05-07 ENCOUNTER — Ambulatory Visit (INDEPENDENT_AMBULATORY_CARE_PROVIDER_SITE_OTHER): Payer: Medicare HMO

## 2011-05-07 DIAGNOSIS — I4891 Unspecified atrial fibrillation: Secondary | ICD-10-CM

## 2011-05-07 LAB — POCT INR: INR: 2.2

## 2011-05-07 NOTE — Patient Instructions (Signed)
2.5 mg only on mondays then 5 mg on other days,check in 4 weeks

## 2011-05-11 ENCOUNTER — Other Ambulatory Visit: Payer: Self-pay | Admitting: Neurological Surgery

## 2011-05-11 DIAGNOSIS — M549 Dorsalgia, unspecified: Secondary | ICD-10-CM

## 2011-05-15 ENCOUNTER — Other Ambulatory Visit: Payer: Self-pay | Admitting: Neurological Surgery

## 2011-05-15 DIAGNOSIS — Z139 Encounter for screening, unspecified: Secondary | ICD-10-CM

## 2011-05-16 ENCOUNTER — Other Ambulatory Visit: Payer: Self-pay | Admitting: Cardiology

## 2011-05-17 ENCOUNTER — Ambulatory Visit
Admission: RE | Admit: 2011-05-17 | Discharge: 2011-05-17 | Disposition: A | Discharge status: Home / Self Care | Payer: Medicare HMO | Source: Ambulatory Visit | Attending: Neurological Surgery | Admitting: Neurological Surgery

## 2011-05-17 DIAGNOSIS — Z139 Encounter for screening, unspecified: Secondary | ICD-10-CM

## 2011-05-17 DIAGNOSIS — M549 Dorsalgia, unspecified: Secondary | ICD-10-CM

## 2011-05-22 ENCOUNTER — Encounter: Payer: Self-pay | Admitting: Internal Medicine

## 2011-05-22 ENCOUNTER — Ambulatory Visit (INDEPENDENT_AMBULATORY_CARE_PROVIDER_SITE_OTHER): Payer: Medicare HMO | Admitting: Internal Medicine

## 2011-05-22 DIAGNOSIS — Z7901 Long term (current) use of anticoagulants: Secondary | ICD-10-CM

## 2011-05-22 DIAGNOSIS — J449 Chronic obstructive pulmonary disease, unspecified: Secondary | ICD-10-CM

## 2011-05-22 DIAGNOSIS — I4891 Unspecified atrial fibrillation: Secondary | ICD-10-CM

## 2011-05-22 DIAGNOSIS — I10 Essential (primary) hypertension: Secondary | ICD-10-CM

## 2011-05-22 NOTE — Patient Instructions (Signed)
Limit your sodium (Salt) intake  Return in 3 months for follow-up   

## 2011-05-22 NOTE — Progress Notes (Signed)
  Subjective:    Patient ID: Stephen [name], male    DOB: 08-23-1941, 70 y.o.   MRN: [id number]  HPI  70 year old patient who is seen today for followup. He has advanced steroid-dependent oxygen-dependent COPD. His palmar status has been stable. He's been followed by neurosurgery for back pain secondary to an apparent compression fracture. He's had some constipation issues related to analgesic therapy. He has atrial fibrillation and remains on Coumadin anticoagulation. He has treated hypertension. His cardiopulmonary status has been stable.    Review of Systems  Constitutional: Negative for fever, chills, appetite change and fatigue.  HENT: Negative for hearing loss, ear pain, congestion, sore throat, trouble swallowing, neck stiffness, dental problem, voice change and tinnitus.   Eyes: Negative for pain, discharge and visual disturbance.  Respiratory: Positive for shortness of breath. Negative for cough, chest tightness, wheezing and stridor.   Cardiovascular: Negative for chest pain, palpitations and leg swelling.  Gastrointestinal: Positive for constipation. Negative for nausea, vomiting, abdominal pain, diarrhea, blood in stool and abdominal distention.  Genitourinary: Negative for urgency, hematuria, flank pain, discharge, difficulty urinating and genital sores.  Musculoskeletal: Negative for myalgias, back pain, joint swelling, arthralgias and gait problem.  Skin: Negative for rash.  Neurological: Negative for dizziness, syncope, speech difficulty, weakness, numbness and headaches.  Hematological: Negative for adenopathy. Does not bruise/bleed easily.  Psychiatric/Behavioral: Negative for behavioral problems and dysphoric mood. The patient is not nervous/anxious.        Objective:   Physical Exam  Constitutional: He is oriented to person, place, and time. He appears well-developed.  HENT:  Head: Normocephalic.  Right Ear: External ear normal.  Left Ear: External ear normal.    Eyes: Conjunctivae and EOM are normal.  Neck: Normal range of motion.  Cardiovascular: Normal rate and normal heart sounds.   Pulmonary/Chest: Breath sounds normal.  Abdominal: Soft. Bowel sounds are normal. He exhibits no distension. There is tenderness. There is rebound and guarding.       Mildly distended and tympanitic. No tenderness  Musculoskeletal: Normal range of motion. He exhibits no edema and no tenderness.  Neurological: He is alert and oriented to person, place, and time.  Psychiatric: He has a normal mood and affect. His behavior is normal.          Assessment & Plan:   Advanced COPD Atrial fibrillation Hypertension stable  We'll continue present regimen We'll check a pro time next week as scheduled Return here in 3 months.

## 2011-05-23 ENCOUNTER — Ambulatory Visit
Admission: RE | Admit: 2011-05-23 | Discharge: 2011-05-23 | Disposition: A | Discharge status: Home / Self Care | Payer: Medicare HMO | Source: Ambulatory Visit | Attending: Neurological Surgery | Admitting: Neurological Surgery

## 2011-06-06 ENCOUNTER — Ambulatory Visit (INDEPENDENT_AMBULATORY_CARE_PROVIDER_SITE_OTHER): Payer: Medicare HMO | Admitting: Internal Medicine

## 2011-06-06 ENCOUNTER — Encounter: Payer: Self-pay | Admitting: Internal Medicine

## 2011-06-06 DIAGNOSIS — J449 Chronic obstructive pulmonary disease, unspecified: Secondary | ICD-10-CM

## 2011-06-06 DIAGNOSIS — Z0000 Encounter for general adult medical examination without abnormal findings: Secondary | ICD-10-CM

## 2011-06-06 DIAGNOSIS — R300 Dysuria: Secondary | ICD-10-CM

## 2011-06-06 DIAGNOSIS — Z23 Encounter for immunization: Secondary | ICD-10-CM

## 2011-06-06 DIAGNOSIS — N139 Obstructive and reflux uropathy, unspecified: Secondary | ICD-10-CM

## 2011-06-06 DIAGNOSIS — N138 Other obstructive and reflux uropathy: Secondary | ICD-10-CM

## 2011-06-06 DIAGNOSIS — I10 Essential (primary) hypertension: Secondary | ICD-10-CM

## 2011-06-06 DIAGNOSIS — I4891 Unspecified atrial fibrillation: Secondary | ICD-10-CM

## 2011-06-06 DIAGNOSIS — N401 Enlarged prostate with lower urinary tract symptoms: Secondary | ICD-10-CM

## 2011-06-06 LAB — POCT URINALYSIS DIPSTICK
Blood, UA: NEGATIVE
Ketones, UA: NEGATIVE
Protein, UA: NEGATIVE
Spec Grav, UA: 1.01
Urobilinogen, UA: 0.2

## 2011-06-06 MED ORDER — TAMSULOSIN HCL 0.4 MG PO CAPS: 0.4000 mg | ORAL_CAPSULE | ORAL | Status: DC

## 2011-06-06 MED ORDER — CIPROFLOXACIN HCL 500 MG PO TABS: 500.0000 mg | ORAL_TABLET | Freq: Two times a day (BID) | ORAL | Status: DC

## 2011-06-06 NOTE — Patient Instructions (Signed)
Coumadin take 2.5 mg for the next week until antibiotic therapy completed and then resume Coumadin 2.5 mg on Monday and 5 mg 6 days per week recheck INR in one month  Flomax one twice daily for one week and then once daily  Antibiotic therapy was prescribed for the child due to specific medical indications.  Call or return to clinic prn if these symptoms worsen or fail to improve as anticipated.

## 2011-06-06 NOTE — Progress Notes (Signed)
  Subjective:    Patient ID: Stephen [name], male    DOB: 1941-07-17, 70 y.o.   MRN: [id number]  HPI  70 year old patient who has a history of advanced COPD. For the past several days, he has had urinary difficulties with the urgency and slow stream. There is no real dysuria. His pulmonary status is fairly stable although he has noted some increasing chest congestion and cough. No fever or worsening shortness of breath or wheezing. He has treated hypertension which has been stable  Review of Systems  Constitutional: Negative for fever, chills, appetite change and fatigue.  HENT: Negative for hearing loss, ear pain, congestion, sore throat, trouble swallowing, neck stiffness, dental problem, voice change and tinnitus.   Eyes: Negative for pain, discharge and visual disturbance.  Respiratory: Positive for cough. Negative for chest tightness, wheezing and stridor.   Cardiovascular: Negative for chest pain, palpitations and leg swelling.  Gastrointestinal: Negative for nausea, vomiting, abdominal pain, diarrhea, constipation, blood in stool and abdominal distention.  Genitourinary: Positive for urgency and difficulty urinating. Negative for hematuria, flank pain, discharge and genital sores.  Musculoskeletal: Negative for myalgias, back pain, joint swelling, arthralgias and gait problem.  Skin: Negative for rash.  Neurological: Negative for dizziness, syncope, speech difficulty, weakness, numbness and headaches.  Hematological: Negative for adenopathy. Does not bruise/bleed easily.  Psychiatric/Behavioral: Negative for behavioral problems and dysphoric mood. The patient is not nervous/anxious.        Objective:   Physical Exam  Constitutional: He appears well-developed and well-nourished. No distress.       Blood pressure normal. No distress. Nasal oxygen in place  HENT:  Mouth/Throat: Oropharynx is clear and moist.  Neck: No JVD present. No tracheal deviation present. No thyromegaly present.   Cardiovascular: Regular rhythm and normal heart sounds.        Rate 94  Pulmonary/Chest: Effort normal. No respiratory distress.       Scattered coarse rhonchi. O2 saturation 95 and 96 on supplemental O2          Assessment & Plan:   BPH with obstructive symptoms. We'll place on Flomax. He is asked to take twice daily for one week and then once daily he will report any clinical worsening Advanced COPD. We'll treat with Cipro 500 twice a day for 7 days

## 2011-06-13 ENCOUNTER — Telehealth: Payer: Self-pay | Admitting: *Deleted

## 2011-06-13 NOTE — Telephone Encounter (Signed)
Pt is asking to refill Cipro.  Feels better but not quite where he wants to be yet?  He also would like an order for O2 sent to [facility].  They have better and easier portable oxygen than his medical supply company.

## 2011-06-14 MED ORDER — CIPROFLOXACIN HCL 500 MG PO TABS: 500.0000 mg | ORAL_TABLET | Freq: Two times a day (BID) | ORAL | Status: AC

## 2011-06-14 NOTE — Telephone Encounter (Signed)
Cipro 500 mg  #14  All OK

## 2011-06-14 NOTE — Telephone Encounter (Signed)
Faxed rx for back pack style r/t increased SOB with ambultaion

## 2011-06-14 NOTE — Telephone Encounter (Signed)
Meds sent in and faxed order needs to go to [facility], please.

## 2011-07-04 ENCOUNTER — Other Ambulatory Visit (INDEPENDENT_AMBULATORY_CARE_PROVIDER_SITE_OTHER): Payer: Medicare HMO | Admitting: Internal Medicine

## 2011-07-04 DIAGNOSIS — I4891 Unspecified atrial fibrillation: Secondary | ICD-10-CM

## 2011-07-04 LAB — POCT INR: INR: 2

## 2011-07-04 NOTE — Patient Instructions (Signed)
  Latest dosing instructions   Total Sun Mon Tue Wed Thu Fri Sat   32.5 5 mg 2.5 mg 5 mg 5 mg 5 mg 5 mg 5 mg    (2.5 mg2) (2.5 mg1) (2.5 mg2) (2.5 mg2) (2.5 mg2) (2.5 mg2) (2.5 mg2)

## 2011-07-23 ENCOUNTER — Other Ambulatory Visit: Payer: Self-pay | Admitting: Neurological Surgery

## 2011-07-23 DIAGNOSIS — R918 Other nonspecific abnormal finding of lung field: Secondary | ICD-10-CM

## 2011-07-25 ENCOUNTER — Ambulatory Visit
Admission: RE | Admit: 2011-07-25 | Discharge: 2011-07-25 | Disposition: A | Discharge status: Home / Self Care | Payer: Medicare HMO | Source: Ambulatory Visit | Attending: Neurological Surgery | Admitting: Neurological Surgery

## 2011-07-25 DIAGNOSIS — R918 Other nonspecific abnormal finding of lung field: Secondary | ICD-10-CM

## 2011-07-26 ENCOUNTER — Telehealth: Payer: Self-pay

## 2011-07-26 NOTE — Telephone Encounter (Signed)
Attempt to call - VM- LMTCB - dr. Amador Cunas would like to see next to f/u on letter recv'd from [place] [facility]. Appt 08/20/11 alittle to far out - please call to schedule appt next week. KIK

## 2011-08-06 ENCOUNTER — Ambulatory Visit (INDEPENDENT_AMBULATORY_CARE_PROVIDER_SITE_OTHER): Payer: Medicare HMO | Admitting: Internal Medicine

## 2011-08-06 DIAGNOSIS — I4891 Unspecified atrial fibrillation: Secondary | ICD-10-CM

## 2011-08-06 LAB — POCT INR: INR: 3.7

## 2011-08-06 NOTE — Patient Instructions (Signed)
  Latest dosing instructions   Total Sun Mon Tue Wed Thu Fri Sat   32.5 5 mg 2.5 mg 5 mg 5 mg 5 mg 5 mg 5 mg    (2.5 mg2) (2.5 mg1) (2.5 mg2) (2.5 mg2) (2.5 mg2) (2.5 mg2) (2.5 mg2)        

## 2011-08-10 ENCOUNTER — Encounter (HOSPITAL_COMMUNITY): Payer: Self-pay | Admitting: Pharmacy Technician

## 2011-08-14 ENCOUNTER — Ambulatory Visit (HOSPITAL_COMMUNITY)
Admission: RE | Admit: 2011-08-14 | Discharge: 2011-08-14 | Disposition: A | Discharge status: Home / Self Care | Payer: Medicare HMO | Source: Ambulatory Visit | Attending: Neurological Surgery | Admitting: Neurological Surgery

## 2011-08-14 ENCOUNTER — Encounter (HOSPITAL_COMMUNITY): Payer: Self-pay

## 2011-08-14 ENCOUNTER — Other Ambulatory Visit: Payer: Self-pay

## 2011-08-14 ENCOUNTER — Encounter (HOSPITAL_COMMUNITY)
Admission: RE | Admit: 2011-08-14 | Discharge: 2011-08-14 | Disposition: A | Discharge status: Home / Self Care | Payer: Medicare HMO | Source: Ambulatory Visit | Attending: Neurological Surgery | Admitting: Neurological Surgery

## 2011-08-14 ENCOUNTER — Encounter (HOSPITAL_COMMUNITY): Payer: Self-pay | Admitting: Vascular Surgery

## 2011-08-14 DIAGNOSIS — Z01818 Encounter for other preprocedural examination: Secondary | ICD-10-CM | POA: Insufficient documentation

## 2011-08-14 DIAGNOSIS — I517 Cardiomegaly: Secondary | ICD-10-CM | POA: Insufficient documentation

## 2011-08-14 DIAGNOSIS — J438 Other emphysema: Secondary | ICD-10-CM | POA: Insufficient documentation

## 2011-08-14 DIAGNOSIS — I10 Essential (primary) hypertension: Secondary | ICD-10-CM | POA: Insufficient documentation

## 2011-08-14 HISTORY — DX: Lesion of lateral popliteal nerve, unspecified lower limb: G57.30

## 2011-08-14 HISTORY — DX: Unspecified osteoarthritis, unspecified site: M19.90

## 2011-08-14 HISTORY — DX: Shortness of breath: R06.02

## 2011-08-14 HISTORY — DX: Other complications of anesthesia, initial encounter: T88.59XA

## 2011-08-14 HISTORY — DX: Myoneural disorder, unspecified: G70.9

## 2011-08-14 HISTORY — DX: Adverse effect of unspecified anesthetic, initial encounter: T41.45XA

## 2011-08-14 HISTORY — DX: Dependence on supplemental oxygen: Z99.81

## 2011-08-14 LAB — CBC
HCT: 41.2 % (ref 39.0–52.0)
Hemoglobin: 13.5 g/dL (ref 13.0–17.0)
MCH: 29.3 pg (ref 26.0–34.0)
MCV: 89.6 fL (ref 78.0–100.0)
RBC: 4.6 MIL/uL (ref 4.22–5.81)

## 2011-08-14 LAB — DIFFERENTIAL
Eosinophils Absolute: 0.4 10*3/uL (ref 0.0–0.7)
Lymphs Abs: 1 10*3/uL (ref 0.7–4.0)
Monocytes Absolute: 0.8 10*3/uL (ref 0.1–1.0)
Monocytes Relative: 8 % (ref 3–12)
Neutrophils Relative %: 78 % — ABNORMAL HIGH (ref 43–77)

## 2011-08-14 LAB — BASIC METABOLIC PANEL
BUN: 26 mg/dL — ABNORMAL HIGH (ref 6–23)
GFR calc non Af Amer: 39 mL/min — ABNORMAL LOW (ref >90)
Glucose, Bld: 109 mg/dL — ABNORMAL HIGH (ref 70–99)
Potassium: 5 mEq/L (ref 3.5–5.1)

## 2011-08-14 LAB — PROTIME-INR: INR: 2.14 — ABNORMAL HIGH (ref 0.00–1.49)

## 2011-08-14 LAB — SURGICAL PCR SCREEN: MRSA, PCR: NEGATIVE

## 2011-08-14 NOTE — Consult Note (Addendum)
Anesthesia:  Patient is a 70 year old male for left peroneal nerve decompression.  Hx + for paroxysmal afib (Dr. Jens Som), COPD on continuous O2 @ 2L/[place], asthma, BPH, HTN.  His COPD is managed by his PCP, Dr. Amador Cunas .  From notes, it appears his last COPD exacerbation was in September.  He is on Coumadin therapy for his hx of afib, although he is now is SR.  Dr. Jens Som last saw him on 01/12/11.  He had an echocardiogram on 12/13/10 (under Notes tab) showing normal LV function, grade 1 diastolic dysfunction, and trivial pericardial effusion.  I was asked to review his preoperative labs showing elevated PT/INR.  I will have this repeated when he arrives for surgery.  His BUN/Cr are also elevated, but appear stable (BUN 28-54, Cr 1.37-1.98 since March 2012).  If his INR is reasonable, and if he has no acute cardiopulmonary symptoms, then I anticipate that he can proceed as scheduled.

## 2011-08-14 NOTE — Pre-Procedure Instructions (Signed)
20 SHOJI PERTUIT  08/14/2011   Your procedure is scheduled on:  December 7  Report to [facility] at 5:30 AM.  Call this number if you have problems the morning of surgery: [phone]   Remember:   Do not eat food:After Midnight.  May have clear liquids: up to 4 Hours before arrival.  Clear liquids include soda, tea, black coffee, apple or grape juice, broth.  Take these medicines the morning of surgery with A SIP OF WATER: Advair, Vicodin if needed, Albuterol (bring to surgery), Amiodarone, Cardizem, Xopenex, Spiriva    Do not wear jewelry, make-up or nail polish.  Do not wear lotions, powders, or perfumes. You may wear deodorant.  Do not shave 48 hours prior to surgery.  Do not bring valuables to the hospital.  Contacts, dentures or bridgework may not be worn into surgery.  Leave suitcase in the car. After surgery it may be brought to your room.  For patients admitted to the hospital, checkout time is 11:00 AM the day of discharge.   Patients discharged the day of surgery will not be allowed to drive home.  Name and phone number of your driver: NA  Special Instructions: CHG Shower Use Special Wash: 1/2 bottle night before surgery and 1/2 bottle morning of surgery.   Please read over the following fact sheets that you were given: Pain Booklet, Coughing and Deep Breathing and Surgical Site Infection Prevention

## 2011-08-14 NOTE — Progress Notes (Signed)
LM with Erie Noe in Dr. Yetta Barre' office requesting verification of procedure/orders.

## 2011-08-17 ENCOUNTER — Other Ambulatory Visit: Payer: Self-pay | Admitting: Internal Medicine

## 2011-08-17 ENCOUNTER — Other Ambulatory Visit: Payer: Self-pay | Admitting: Cardiology

## 2011-08-20 ENCOUNTER — Ambulatory Visit (INDEPENDENT_AMBULATORY_CARE_PROVIDER_SITE_OTHER): Payer: Medicare HMO | Admitting: Internal Medicine

## 2011-08-20 ENCOUNTER — Encounter: Payer: Self-pay | Admitting: Internal Medicine

## 2011-08-20 DIAGNOSIS — I10 Essential (primary) hypertension: Secondary | ICD-10-CM

## 2011-08-20 DIAGNOSIS — I4891 Unspecified atrial fibrillation: Secondary | ICD-10-CM

## 2011-08-20 DIAGNOSIS — J449 Chronic obstructive pulmonary disease, unspecified: Secondary | ICD-10-CM

## 2011-08-20 MED ORDER — TIOTROPIUM BROMIDE MONOHYDRATE 18 MCG IN CAPS: 18.0000 ug | ORAL_CAPSULE | Freq: Every day | RESPIRATORY_TRACT | Status: DC | PRN

## 2011-08-20 NOTE — Patient Instructions (Signed)
Limit your sodium (Salt) intake  Return in 6 months for follow-up  

## 2011-08-20 NOTE — Progress Notes (Signed)
  Subjective:    Patient ID: Stephen [name], male    DOB: 04-Mar-1941, 70 y.o.   MRN: [id number]  HPI70 year old patient who is seen today for follow up. He has a history of advanced oxygen-dependent COPD. His cardiopulmonary status has been stable. He has treated hypertension and also a history of paroxysmal atrial fibrillation. No concerns or complaints today.  He has a 6 month history of left foot drop and is scheduled for surgery this month for a peroneal entrapment.  Denies any palpitations and he feels that his rhythm has been regular. Remains on oxygen 2 L per minute.   Review of Systems  Constitutional: Negative for fever, chills, appetite change and fatigue.  HENT: Negative for hearing loss, ear pain, congestion, sore throat, trouble swallowing, neck stiffness, dental problem, voice change and tinnitus.   Eyes: Negative for pain, discharge and visual disturbance.  Respiratory: Positive for shortness of breath. Negative for cough, chest tightness, wheezing and stridor.   Cardiovascular: Negative for chest pain, palpitations and leg swelling.  Gastrointestinal: Negative for nausea, vomiting, abdominal pain, diarrhea, constipation, blood in stool and abdominal distention.  Genitourinary: Negative for urgency, hematuria, flank pain, discharge, difficulty urinating and genital sores.  Musculoskeletal: Positive for back pain and gait problem. Negative for myalgias, joint swelling and arthralgias.  Skin: Negative for rash.  Neurological: Negative for dizziness, syncope, speech difficulty, weakness, numbness and headaches.  Hematological: Negative for adenopathy. Does not bruise/bleed easily.  Psychiatric/Behavioral: Negative for behavioral problems and dysphoric mood. The patient is not nervous/anxious.        Objective:   Physical Exam  Constitutional: He is oriented to person, place, and time. He appears well-developed. No distress.       No distress on nasal oxygen O2 saturation at  rest 97% Blood pressure normal  HENT:  Head: Normocephalic.  Right Ear: External ear normal.  Left Ear: External ear normal.  Eyes: Conjunctivae and EOM are normal.  Neck: Normal range of motion.  Cardiovascular: Normal rate, regular rhythm and normal heart sounds.   Pulmonary/Chest: Effort normal. No respiratory distress. He has no wheezes.       Diminished breath sounds and a few scattered rhonchi. No active wheezing O2 saturation 97%  Abdominal: Bowel sounds are normal.  Musculoskeletal: Normal range of motion. He exhibits no edema and no tenderness.  Neurological: He is alert and oriented to person, place, and time.  Psychiatric: He has a normal mood and affect. His behavior is normal.          Assessment & Plan:  Advanced COPD. Stable Hypertension well controlled Peroneal palsy Paroxysmal atrial fibrillation. Stable

## 2011-08-24 ENCOUNTER — Telehealth: Payer: Self-pay | Admitting: Family Medicine

## 2011-08-24 ENCOUNTER — Encounter (HOSPITAL_COMMUNITY): Admission: RE | Payer: Self-pay | Source: Ambulatory Visit

## 2011-08-24 ENCOUNTER — Ambulatory Visit (HOSPITAL_COMMUNITY): Admission: RE | Admit: 2011-08-24 | Payer: Medicare HMO | Source: Ambulatory Visit | Admitting: Neurological Surgery

## 2011-08-24 SURGERY — PERONEAL NERVE DECOMPRESSION
Anesthesia: General | Laterality: Left

## 2011-08-24 MED ORDER — AZITHROMYCIN 250 MG PO TABS: ORAL_TABLET | ORAL | Status: AC

## 2011-08-24 NOTE — Telephone Encounter (Signed)
Patient has advanced pulmonary disease please call in a Z-Pak and

## 2011-08-24 NOTE — Telephone Encounter (Signed)
Please advise 

## 2011-08-24 NOTE — Telephone Encounter (Signed)
Pulled from [place] [facility]mail. Was sneexing with runny eyes. Now it has migrated into his chest with productive cough. Would like Rx called to [facility] [address]. Please call & advise.

## 2011-08-24 NOTE — Telephone Encounter (Signed)
Attempt to call pt - VM - lmtcb if questions [facility]

## 2011-08-29 ENCOUNTER — Telehealth: Payer: Self-pay

## 2011-08-29 NOTE — Telephone Encounter (Signed)
Pt states he was given an rx for congestion, mucus and wheezing,  Pt states the prescription was not enough. Pt is still  having problems.  Pt states he had the same symptoms last year and Cipro worked great. Pt would like rx sent in.  Pls advise.

## 2011-08-30 MED ORDER — CIPROFLOXACIN HCL 500 MG PO TABS: 500.0000 mg | ORAL_TABLET | Freq: Two times a day (BID) | ORAL | Status: AC

## 2011-08-30 NOTE — Telephone Encounter (Signed)
Generic cipro 500  #14 one bid

## 2011-08-30 NOTE — Telephone Encounter (Signed)
rx sent to pharmacy

## 2011-09-05 ENCOUNTER — Ambulatory Visit: Payer: Medicare HMO

## 2011-09-06 ENCOUNTER — Ambulatory Visit (INDEPENDENT_AMBULATORY_CARE_PROVIDER_SITE_OTHER): Payer: Medicare HMO | Admitting: Internal Medicine

## 2011-09-06 DIAGNOSIS — I4891 Unspecified atrial fibrillation: Secondary | ICD-10-CM

## 2011-09-06 LAB — POCT INR: INR: 1.9

## 2011-09-06 NOTE — Patient Instructions (Signed)
  Latest dosing instructions   Total Sun Mon Tue Wed Thu Fri Sat   32.5 5 mg 2.5 mg 5 mg 5 mg 5 mg 5 mg 5 mg    (2.5 mg2) (2.5 mg1) (2.5 mg2) (2.5 mg2) (2.5 mg2) (2.5 mg2) (2.5 mg2)        

## 2011-09-10 ENCOUNTER — Other Ambulatory Visit: Payer: Self-pay | Admitting: Cardiology

## 2011-10-05 ENCOUNTER — Ambulatory Visit (INDEPENDENT_AMBULATORY_CARE_PROVIDER_SITE_OTHER): Payer: Medicare Other

## 2011-10-05 DIAGNOSIS — I4891 Unspecified atrial fibrillation: Secondary | ICD-10-CM

## 2011-10-05 LAB — POCT INR: INR: 2.5

## 2011-10-05 NOTE — Patient Instructions (Signed)
  Latest dosing instructions   Total Sun Mon Tue Wed Thu Fri Sat   32.5 5 mg 2.5 mg 5 mg 5 mg 5 mg 5 mg 5 mg    (2.5 mg2) (2.5 mg1) (2.5 mg2) (2.5 mg2) (2.5 mg2) (2.5 mg2) (2.5 mg2)        

## 2011-11-05 ENCOUNTER — Ambulatory Visit: Payer: Medicare Other

## 2011-11-05 ENCOUNTER — Ambulatory Visit (INDEPENDENT_AMBULATORY_CARE_PROVIDER_SITE_OTHER): Payer: Medicare Other

## 2011-11-05 DIAGNOSIS — I4891 Unspecified atrial fibrillation: Secondary | ICD-10-CM

## 2011-11-05 DIAGNOSIS — Z5181 Encounter for therapeutic drug level monitoring: Secondary | ICD-10-CM

## 2011-11-05 DIAGNOSIS — Z7901 Long term (current) use of anticoagulants: Secondary | ICD-10-CM

## 2011-11-05 NOTE — Patient Instructions (Signed)
  Latest dosing instructions   Total Sun Mon Tue Wed Thu Fri Sat   32.5 5 mg 2.5 mg 5 mg 5 mg 5 mg 5 mg 5 mg    (2.5 mg2) (2.5 mg1) (2.5 mg2) (2.5 mg2) (2.5 mg2) (2.5 mg2) (2.5 mg2)        

## 2011-12-03 ENCOUNTER — Ambulatory Visit (INDEPENDENT_AMBULATORY_CARE_PROVIDER_SITE_OTHER): Payer: Medicare Other | Admitting: Internal Medicine

## 2011-12-03 DIAGNOSIS — I4891 Unspecified atrial fibrillation: Secondary | ICD-10-CM

## 2011-12-03 NOTE — Patient Instructions (Signed)
  Latest dosing instructions   Total Sun Mon Tue Wed Thu Fri Sat   32.5 5 mg 2.5 mg 5 mg 5 mg 5 mg 5 mg 5 mg    (2.5 mg2) (2.5 mg1) (2.5 mg2) (2.5 mg2) (2.5 mg2) (2.5 mg2) (2.5 mg2)        

## 2011-12-12 ENCOUNTER — Other Ambulatory Visit: Payer: Self-pay | Admitting: Internal Medicine

## 2011-12-31 ENCOUNTER — Ambulatory Visit (INDEPENDENT_AMBULATORY_CARE_PROVIDER_SITE_OTHER): Payer: Medicare Other | Admitting: Internal Medicine

## 2011-12-31 DIAGNOSIS — I4891 Unspecified atrial fibrillation: Secondary | ICD-10-CM

## 2011-12-31 LAB — POCT INR: INR: 3.2

## 2011-12-31 NOTE — Patient Instructions (Signed)
  Latest dosing instructions   Total Sun Mon Tue Wed Thu Fri Sat   32.5 5 mg 2.5 mg 5 mg 5 mg 5 mg 5 mg 5 mg    (2.5 mg2) (2.5 mg1) (2.5 mg2) (2.5 mg2) (2.5 mg2) (2.5 mg2) (2.5 mg2)        

## 2012-01-03 ENCOUNTER — Ambulatory Visit: Payer: Medicare Other

## 2012-01-30 ENCOUNTER — Ambulatory Visit (INDEPENDENT_AMBULATORY_CARE_PROVIDER_SITE_OTHER): Payer: Medicare Other | Admitting: Family

## 2012-01-30 DIAGNOSIS — I4891 Unspecified atrial fibrillation: Secondary | ICD-10-CM

## 2012-01-30 NOTE — Patient Instructions (Signed)
  Latest dosing instructions   Total Sun Mon Tue Wed Thu Fri Sat   27.5 5 mg 2.5 mg 5 mg 2.5 mg 5 mg 2.5 mg 5 mg    (2.5 mg2) (2.5 mg1) (2.5 mg2) (2.5 mg1) (2.5 mg2) (2.5 mg1) (2.5 mg2)

## 2012-02-13 ENCOUNTER — Ambulatory Visit (INDEPENDENT_AMBULATORY_CARE_PROVIDER_SITE_OTHER): Payer: Medicare Other | Admitting: Family

## 2012-02-13 DIAGNOSIS — I4891 Unspecified atrial fibrillation: Secondary | ICD-10-CM

## 2012-02-13 NOTE — Patient Instructions (Signed)
  Latest dosing instructions   Total Sun Mon Tue Wed Thu Fri Sat   27.5 5 mg 2.5 mg 5 mg 2.5 mg 5 mg 2.5 mg 5 mg    (2.5 mg2) (2.5 mg1) (2.5 mg2) (2.5 mg1) (2.5 mg2) (2.5 mg1) (2.5 mg2)       Hold today. Resume Coumadin on Thurs at 5 mg. Then take 5 mg a day except Monday, Wednesday, and Friday take 2.5mg . Recheck in 2 weeks.

## 2012-02-18 ENCOUNTER — Encounter: Payer: Self-pay | Admitting: Internal Medicine

## 2012-02-18 ENCOUNTER — Ambulatory Visit (INDEPENDENT_AMBULATORY_CARE_PROVIDER_SITE_OTHER): Payer: Medicare Other | Admitting: Internal Medicine

## 2012-02-18 VITALS — BP 140/70 | Wt 138.0 lb

## 2012-02-18 DIAGNOSIS — J4489 Other specified chronic obstructive pulmonary disease: Secondary | ICD-10-CM

## 2012-02-18 DIAGNOSIS — I10 Essential (primary) hypertension: Secondary | ICD-10-CM

## 2012-02-18 DIAGNOSIS — I4891 Unspecified atrial fibrillation: Secondary | ICD-10-CM

## 2012-02-18 DIAGNOSIS — J449 Chronic obstructive pulmonary disease, unspecified: Secondary | ICD-10-CM

## 2012-02-18 MED ORDER — TIOTROPIUM BROMIDE MONOHYDRATE 18 MCG IN CAPS: 18.0000 ug | ORAL_CAPSULE | Freq: Every day | RESPIRATORY_TRACT | Status: DC | PRN

## 2012-02-18 MED ORDER — DILTIAZEM HCL ER COATED BEADS 360 MG PO CP24: 360.0000 mg | ORAL_CAPSULE | Freq: Every day | ORAL | Status: DC

## 2012-02-18 MED ORDER — FLUTICASONE-SALMETEROL 250-50 MCG/DOSE IN AEPB: 1.0000 | INHALATION_SPRAY | Freq: Two times a day (BID) | RESPIRATORY_TRACT | Status: DC

## 2012-02-18 MED ORDER — LEVALBUTEROL HCL 1.25 MG/3ML IN NEBU: 1.2500 mg | INHALATION_SOLUTION | Freq: Four times a day (QID) | RESPIRATORY_TRACT | Status: DC | PRN

## 2012-02-18 MED ORDER — TAMSULOSIN HCL 0.4 MG PO CAPS: 0.4000 mg | ORAL_CAPSULE | ORAL | Status: DC

## 2012-02-18 NOTE — Patient Instructions (Signed)
Limit your sodium (Salt) intake    It is important that you exercise regularly, at least 20 minutes 3 to 4 times per week.  If you develop chest pain or shortness of breath seek  medical attention.  Return in 6 months for follow-up  

## 2012-02-18 NOTE — Progress Notes (Signed)
Subjective:    Patient ID: Stephen [name], male    DOB: 02-03-1941, 71 y.o.   MRN: [id number]  HPI  71 year old patient who is seen today for his biannual followup. He has a history of paroxysmal atrial fibrillation and remains on rhythm control/Coumadin anticoagulation. He continues to do well and remains in a normal sinus rhythm today He has a history of advanced COPD and is maintained on multiple inhalational medications. He remains stable he is oxygen dependent. No new, complaints. History of hypertension which has been well-controlled he remains on diltiazem. INRs are followed at least monthly. 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 His left footdrop has resolved  Past Medical History  Diagnosis Date  . ASTHMA 04/22/2007  . ATRIAL FIBRILLATION, PAROXYSMAL 05/28/2007  . BENIGN PROSTATIC HYPERTROPHY 04/20/2008  . COPD 05/28/2007  . HYPERTENSION 04/22/2007  . HYPOTENSION 10/10/2010  . 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 Complication of anesthesia     difficulty waking up  . On home oxygen therapy     intermittent, uses with increased activity  . Shortness of breath   . Neuromuscular disorder   . Arthritis   . Peroneal neuropathy     L     History   Social History  . Marital Status: Widowed    Spouse Name: N/A    Number of Children: N/A  . Years of Education: N/A   Occupational History  . Not on file.   Social History Main Topics  . Smoking status: Former Smoker    Quit date: 09/18/1983  . Smokeless tobacco: Never Used  . Alcohol Use: No  . Drug Use: No  . Sexually Active: Not on file   Other Topics Concern  . Not on file   Social History Narrative  . No narrative on file    Past Surgical History  Procedure Date  . Tonsillectomy   . Colonoscopy     No family history on file.  Allergies  Allergen Reactions  . Penicillins Other (See Comments)    REACTION: feels drunk    Current Outpatient Prescriptions on  File Prior to Visit  Medication Sig Dispense Refill  . ADVAIR DISKUS 250-50 MCG/DOSE AEPB inhale 1 puff by mouth twice a day  60 each  6  . albuterol (PROVENTIL HFA;VENTOLIN HFA) 108 (90 BASE) MCG/ACT inhaler Inhale 2 puffs into the lungs every 6 (six) hours as needed. For shortness of breath       . AMBULATORY NON FORMULARY MEDICATION Medication Name: oxygen 2 liters      . amiodarone (PACERONE) 200 MG tablet take 1 tablet by mouth once daily  30 tablet  6  . diltiazem (CARDIZEM CD) 360 MG 24 hr capsule Take 360 mg by mouth daily.        Marland Kitchen HYDROcodone-acetaminophen (VICODIN) 5-500 MG per tablet Take 1 tablet by mouth every 6 (six) hours as needed. For pain      . levalbuterol (XOPENEX) 1.25 MG/3ML nebulizer solution inhale contents of 1 vial in nebulizer four times a day  360 mL  6  . Multiple Vitamin (MULTIVITAMIN) tablet Take 1 tablet by mouth daily.        . polyethylene glycol (MIRALAX / GLYCOLAX) packet Take 17 g by mouth daily. For constipation      . Tamsulosin HCl (FLOMAX) 0.4 MG CAPS Take 1 capsule (0.4 mg total) by mouth daily after supper.  60 capsule  4  . tiotropium (SPIRIVA)  18 MCG inhalation capsule Place 1 capsule (18 mcg total) into inhaler and inhale daily as needed. For asthma symptoms   30 capsule  6  . warfarin (COUMADIN) 2.5 MG tablet take UP to 2 tablets by mouth once daily as directed  180 tablet  2    BP 140/70  Wt 138 lb (62.596 kg)  SpO2 91%       Review of Systems  Constitutional: Negative for fever, chills, appetite change and fatigue.  HENT: Negative for hearing loss, ear pain, congestion, sore throat, trouble swallowing, neck stiffness, dental problem, voice change and tinnitus.   Eyes: Negative for pain, discharge and visual disturbance.  Respiratory: Negative for cough, chest tightness, wheezing and stridor. Shortness of breath: Dyspnea on exertion.   Cardiovascular: Negative for chest pain, palpitations and leg swelling.  Gastrointestinal: Negative  for nausea, vomiting, abdominal pain, diarrhea, constipation, blood in stool and abdominal distention.  Genitourinary: Negative for urgency, hematuria, flank pain, discharge, difficulty urinating and genital sores.  Musculoskeletal: Negative for myalgias, back pain, joint swelling, arthralgias and gait problem.  Skin: Negative for rash.  Neurological: Negative for dizziness, syncope, speech difficulty, weakness, numbness and headaches.  Hematological: Negative for adenopathy. Does not bruise/bleed easily.  Psychiatric/Behavioral: Negative for behavioral problems and dysphoric mood. The patient is not nervous/anxious.        Objective:   Physical Exam  Constitutional: He is oriented to person, place, and time. He appears well-developed.  HENT:  Head: Normocephalic.  Right Ear: External ear normal.  Left Ear: External ear normal.  Eyes: Conjunctivae and EOM are normal.  Neck: Normal range of motion.  Cardiovascular: Normal rate and normal heart sounds.   Pulmonary/Chest: Effort normal. No respiratory distress. He has no wheezes. He has no rales.       Breath sounds generally diminished Scattered coarse rhonchi O2 saturation room air 91%  Abdominal: Bowel sounds are normal.  Musculoskeletal: Normal range of motion. He exhibits no edema and no tenderness.  Neurological: He is alert and oriented to person, place, and time.  Psychiatric: He has a normal mood and affect. His behavior is normal.          Assessment & Plan:   Advanced oxygen-dependent COPD. We'll continue oxygen therapy and nebulizer treatments. All medications refilled Hypertension well controlled Paroxysmal atrial  fibrillation we'll continue Coumadin anticoagulation and rhythm control with amiodarone

## 2012-02-27 ENCOUNTER — Encounter: Payer: Medicare Other | Admitting: Family

## 2012-02-29 ENCOUNTER — Ambulatory Visit (INDEPENDENT_AMBULATORY_CARE_PROVIDER_SITE_OTHER): Payer: Medicare Other | Admitting: Family

## 2012-02-29 DIAGNOSIS — I4891 Unspecified atrial fibrillation: Secondary | ICD-10-CM

## 2012-02-29 NOTE — Patient Instructions (Signed)
  Latest dosing instructions   Total Sun Mon Tue Wed Thu Fri Sat   27.5 5 mg 2.5 mg 5 mg 2.5 mg 5 mg 2.5 mg 5 mg    (2.5 mg2) (2.5 mg1) (2.5 mg2) (2.5 mg1) (2.5 mg2) (2.5 mg1) (2.5 mg2)       

## 2012-03-28 ENCOUNTER — Ambulatory Visit (INDEPENDENT_AMBULATORY_CARE_PROVIDER_SITE_OTHER): Payer: Medicare Other | Admitting: Family

## 2012-03-28 DIAGNOSIS — I4891 Unspecified atrial fibrillation: Secondary | ICD-10-CM

## 2012-03-28 LAB — POCT INR: INR: 3.4

## 2012-03-28 NOTE — Patient Instructions (Addendum)
Hold Coumadin today. Then Continue the same dose. Recheck in 3 weeks.    Latest dosing instructions   Total Sun Mon Tue Wed Thu Fri Sat   27.5 5 mg 2.5 mg 5 mg 2.5 mg 5 mg 2.5 mg 5 mg    (2.5 mg2) (2.5 mg1) (2.5 mg2) (2.5 mg1) (2.5 mg2) (2.5 mg1) (2.5 mg2)

## 2012-03-31 ENCOUNTER — Other Ambulatory Visit: Payer: Self-pay | Admitting: Internal Medicine

## 2012-04-02 ENCOUNTER — Other Ambulatory Visit: Payer: Self-pay | Admitting: Cardiology

## 2012-04-14 ENCOUNTER — Telehealth: Payer: Self-pay | Admitting: Internal Medicine

## 2012-04-14 NOTE — Telephone Encounter (Signed)
Caller: [place]/Patient; PCP: Eleonore Chiquito; CB#: [phone]; Call regarding Back Pain;  Pt is being treated for chronic back pain by Dr. Marikay Alar, Neurosurgeon and [facility]. Pt is taking Hydrocodone (prescribed at the first of the month) by Dr. Yetta Barre. However. Dr. Yetta Barre has instructed pt's that his chronic back pain should be managed by Dr. Lesia Hausen since pt is not a surgical candidate. Pt would like to know what he needs to do for future trx and control of pain. Please advice. Pt still has medication and is not in "immediate" need but would like to know "where to go from here". Pt states a call back on 7/30 would be ok in the afternoon. [birth year], [place].

## 2012-04-15 NOTE — Telephone Encounter (Signed)
Attempt to call- VM- LMTCB if questions - Dr. Amador Cunas will be okay with pain med management - let pharmacy know when he needs RF and send it to [place] to okay. KIK

## 2012-04-15 NOTE — Telephone Encounter (Signed)
Will be okay to refill medications here

## 2012-04-17 ENCOUNTER — Other Ambulatory Visit: Payer: Self-pay | Admitting: Internal Medicine

## 2012-04-17 NOTE — Telephone Encounter (Signed)
ok 

## 2012-04-17 NOTE — Telephone Encounter (Signed)
Please advise on quanity and extra RFs -recent telephone note indicated you would manage this for him

## 2012-04-18 ENCOUNTER — Ambulatory Visit (INDEPENDENT_AMBULATORY_CARE_PROVIDER_SITE_OTHER): Payer: Medicare Other | Admitting: Family

## 2012-04-18 DIAGNOSIS — I4891 Unspecified atrial fibrillation: Secondary | ICD-10-CM

## 2012-04-18 LAB — POCT INR: INR: 3.5

## 2012-04-18 NOTE — Patient Instructions (Addendum)
Hold Coumadin today only! Monday, Wednesday, Friday 2 tabs. All other days, 1 tab.  Recheck in 3 weeks.    Latest dosing instructions   Total Glynis Smiles Tue Wed Thu Fri Sat   25 2.5 mg 5 mg 2.5 mg 5 mg 2.5 mg 5 mg 2.5 mg    (2.5 mg1) (2.5 mg2) (2.5 mg1) (2.5 mg2) (2.5 mg1) (2.5 mg2) (2.5 mg1)

## 2012-05-12 ENCOUNTER — Ambulatory Visit (INDEPENDENT_AMBULATORY_CARE_PROVIDER_SITE_OTHER): Payer: Medicare Other | Admitting: Family

## 2012-05-12 DIAGNOSIS — I4891 Unspecified atrial fibrillation: Secondary | ICD-10-CM

## 2012-05-12 NOTE — Patient Instructions (Signed)
Monday, Wednesday, Friday 2 tabs. All other days, 1 tab.  Recheck in 4 weeks.    Latest dosing instructions   Total Sun Mon Tue Wed Thu Fri Sat   25 2.5 mg 5 mg 2.5 mg 5 mg 2.5 mg 5 mg 2.5 mg    (2.5 mg1) (2.5 mg2) (2.5 mg1) (2.5 mg2) (2.5 mg1) (2.5 mg2) (2.5 mg1)        

## 2012-05-27 ENCOUNTER — Telehealth: Payer: Self-pay | Admitting: Internal Medicine

## 2012-05-27 ENCOUNTER — Ambulatory Visit (INDEPENDENT_AMBULATORY_CARE_PROVIDER_SITE_OTHER): Payer: Medicare Other | Admitting: Internal Medicine

## 2012-05-27 ENCOUNTER — Encounter: Payer: Self-pay | Admitting: Internal Medicine

## 2012-05-27 VITALS — BP 150/90 | HR 99 | Temp 98.0°F | Wt 129.0 lb

## 2012-05-27 DIAGNOSIS — I4891 Unspecified atrial fibrillation: Secondary | ICD-10-CM

## 2012-05-27 DIAGNOSIS — J45909 Unspecified asthma, uncomplicated: Secondary | ICD-10-CM

## 2012-05-27 DIAGNOSIS — J4489 Other specified chronic obstructive pulmonary disease: Secondary | ICD-10-CM

## 2012-05-27 DIAGNOSIS — I10 Essential (primary) hypertension: Secondary | ICD-10-CM

## 2012-05-27 DIAGNOSIS — J449 Chronic obstructive pulmonary disease, unspecified: Secondary | ICD-10-CM

## 2012-05-27 MED ORDER — METHYLPREDNISOLONE ACETATE 80 MG/ML IJ SUSP
80.0000 mg | Freq: Once | INTRAMUSCULAR | Status: AC
  Administered 2012-05-27: 80 mg via INTRAMUSCULAR

## 2012-05-27 NOTE — Telephone Encounter (Signed)
Ok to see at 230p - please schedule

## 2012-05-27 NOTE — Telephone Encounter (Signed)
Pt aware of appt at 2:30pm.

## 2012-05-27 NOTE — Progress Notes (Signed)
Subjective:    Patient ID: Stephen [name], male    DOB: 06-Oct-1940, 71 y.o.   MRN: [id number]  HPI  71 year old patient who is seen today for followup. He has a history of COPD and asthma. For the past few days he's had some increasing cough congestion and shortness of breath especially with activity. He feels well at rest. He is on home O2 as well as nebulizer treatments. Denies any fever or purulent sputum production. Past Medical History  Diagnosis Date  . ASTHMA 04/22/2007  . ATRIAL FIBRILLATION, PAROXYSMAL 05/28/2007  . BENIGN PROSTATIC HYPERTROPHY 04/20/2008  . COPD 05/28/2007  . HYPERTENSION 04/22/2007  . HYPOTENSION 10/10/2010  . 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 Complication of anesthesia     difficulty waking up  . On home oxygen therapy     intermittent, uses with increased activity  . Shortness of breath   . Neuromuscular disorder   . Arthritis   . Peroneal neuropathy     L     History   Social History  . Marital Status: Widowed    Spouse Name: N/A    Number of Children: N/A  . Years of Education: N/A   Occupational History  . Not on file.   Social History Main Topics  . Smoking status: Former Smoker    Quit date: 09/18/1983  . Smokeless tobacco: Never Used  . Alcohol Use: No  . Drug Use: No  . Sexually Active: Not on file   Other Topics Concern  . Not on file   Social History Narrative  . No narrative on file    Past Surgical History  Procedure Date  . Tonsillectomy   . Colonoscopy     No family history on file.  Allergies  Allergen Reactions  . Penicillins Other (See Comments)    REACTION: feels drunk    Current Outpatient Prescriptions on File Prior to Visit  Medication Sig Dispense Refill  . albuterol (PROVENTIL HFA;VENTOLIN HFA) 108 (90 BASE) MCG/ACT inhaler Inhale 2 puffs into the lungs every 6 (six) hours as needed. For shortness of breath       . AMBULATORY NON FORMULARY MEDICATION Medication Name: oxygen 2 liters        . amiodarone (PACERONE) 200 MG tablet take 1 tablet by mouth once daily  30 tablet  6  . diltiazem (CARDIZEM CD) 360 MG 24 hr capsule Take 1 capsule (360 mg total) by mouth daily.  90 capsule  6  . Fluticasone-Salmeterol (ADVAIR DISKUS) 250-50 MCG/DOSE AEPB Inhale 1 puff into the lungs 2 (two) times daily.  60 each  6  . HYDROcodone-acetaminophen (NORCO/VICODIN) 5-325 MG per tablet take 1 tablet by mouth three times a day if needed for pain  90 tablet  0  . HYDROcodone-acetaminophen (VICODIN) 5-500 MG per tablet Take 1 tablet by mouth every 6 (six) hours as needed. For pain      . levalbuterol (XOPENEX) 1.25 MG/3ML nebulizer solution Take 1.25 mg by nebulization every 6 (six) hours as needed for wheezing.  360 mL  6  . Multiple Vitamin (MULTIVITAMIN) tablet Take 1 tablet by mouth daily.        . polyethylene glycol (MIRALAX / GLYCOLAX) packet Take 17 g by mouth daily. For constipation      . Tamsulosin HCl (FLOMAX) 0.4 MG CAPS Take 1 capsule (0.4 mg total) by mouth daily after supper.  60 capsule  4  . tiotropium (SPIRIVA) 18 MCG inhalation capsule Place 1 capsule (  18 mcg total) into inhaler and inhale daily as needed. For asthma symptoms  30 capsule  6  . VENTOLIN HFA 108 (90 BASE) MCG/ACT inhaler inhale 2 puffs by mouth every 6 hours if needed for pain  54 g  6  . warfarin (COUMADIN) 2.5 MG tablet take UP to 2 tablets by mouth once daily as directed  180 tablet  2    BP 150/90  Pulse 99  Temp 98 F (36.7 C) (Oral)  Wt 129 lb (58.514 kg)  SpO2 92%        Review of Systems  Constitutional: Positive for fatigue. Negative for fever, chills and appetite change.  HENT: Positive for congestion. Negative for hearing loss, ear pain, sore throat, trouble swallowing, neck stiffness, dental problem, voice change and tinnitus.   Eyes: Negative for pain, discharge and visual disturbance.  Respiratory: Positive for cough, chest tightness, shortness of breath and wheezing. Negative for stridor.    Cardiovascular: Negative for chest pain, palpitations and leg swelling.  Gastrointestinal: Negative for nausea, vomiting, abdominal pain, diarrhea, constipation, blood in stool and abdominal distention.  Genitourinary: Negative for urgency, hematuria, flank pain, discharge, difficulty urinating and genital sores.  Musculoskeletal: Negative for myalgias, back pain, joint swelling, arthralgias and gait problem.  Skin: Negative for rash.  Neurological: Negative for dizziness, syncope, speech difficulty, weakness, numbness and headaches.  Hematological: Negative for adenopathy. Does not bruise/bleed easily.  Psychiatric/Behavioral: Negative for behavioral problems and dysphoric mood. The patient is not nervous/anxious.        Objective:   Physical Exam  Constitutional: He is oriented to person, place, and time. He appears well-developed and well-nourished. No distress.  HENT:  Head: Normocephalic.  Right Ear: External ear normal.  Left Ear: External ear normal.  Eyes: Conjunctivae and EOM are normal.  Neck: Normal range of motion.  Cardiovascular: Normal rate, regular rhythm and normal heart sounds.   Pulmonary/Chest:       O2 saturation 95 on 2 L of oxygen per minute Pulse rate 92 Generally diminished breath sounds with scattered rhonchi and some faint wheezing and  Abdominal: Bowel sounds are normal.  Musculoskeletal: Normal range of motion. He exhibits no edema and no tenderness.  Neurological: He is alert and oriented to person, place, and time.  Psychiatric: He has a normal mood and affect. His behavior is normal.          Assessment & Plan:   Mild exacerbation of COPD. We'll continue aggressive inhalational medications LAD expectorants and treat with Depo-Medrol. Will hold antibiotic use at this time Hypertension stable Paroxysmal atrial fibrillation stable

## 2012-05-27 NOTE — Telephone Encounter (Signed)
Caller: [facility]/Patient; Patient Name: Stephen [name]; PCP: [facility]); Best Callback Phone Number: [phone]; Reason for call: Cough/Congestion, difficulty breathing. Chest congestion with wheezing, slight cough with clear sputum. "It feels like there is cold in my chest, I have COPD and whatever it is makes me gasp for breath much sooner than normal." Onset 05/24/12. Afebrile. "This happened one time before and he prescribed me something that worked." Is on Oxygen at home 2 liters via nasal canula. Tighness in chest. Call provider immediately disp. Home care advice given.  Spoke with Peri Jefferson, RN, since there is no available appointments until 1430 today and I felt patient should be seen sooner, authorized me to send note to office to see if a sooner appointment could be given. OFFICE PLEASE FOLLOW UP WITH THIS PATIENT AND LET HIM KNOW WHEN HE CAN BE SEEN TODAY, SOONER THAN 1430 IF POSSIBLE. THANK YOU.  THE PATIENT REFUSED 911

## 2012-05-27 NOTE — Patient Instructions (Signed)
Drink as much fluid as you  can tolerate over the next few days  Mucinex  Use twice daily  Call or return to clinic prn if these symptoms worsen or fail to improve as anticipated.

## 2012-06-09 ENCOUNTER — Ambulatory Visit (INDEPENDENT_AMBULATORY_CARE_PROVIDER_SITE_OTHER): Payer: Medicare Other | Admitting: Family

## 2012-06-09 DIAGNOSIS — Z23 Encounter for immunization: Secondary | ICD-10-CM

## 2012-06-09 DIAGNOSIS — I4891 Unspecified atrial fibrillation: Secondary | ICD-10-CM

## 2012-06-09 LAB — POCT INR: INR: 1.9

## 2012-06-09 NOTE — Patient Instructions (Addendum)
Take an extra 1/2 tab today. Monday, Wednesday, Friday 2 tabs. All other days, 1 tab.  Recheck in 4 weeks.   Latest dosing instructions   Total Glynis Smiles Tue Wed Thu Fri Sat   25 2.5 mg 5 mg 2.5 mg 5 mg 2.5 mg 5 mg 2.5 mg    (2.5 mg1) (2.5 mg2) (2.5 mg1) (2.5 mg2) (2.5 mg1) (2.5 mg2) (2.5 mg1)

## 2012-07-07 ENCOUNTER — Ambulatory Visit: Payer: Medicare Other

## 2012-07-07 DIAGNOSIS — I4891 Unspecified atrial fibrillation: Secondary | ICD-10-CM

## 2012-07-07 LAB — POCT INR: INR: 3

## 2012-07-07 NOTE — Patient Instructions (Signed)
  Latest dosing instructions   Total Glynis Smiles Tue Wed Thu Fri Sat   25 2.5 mg 5 mg 2.5 mg 5 mg 2.5 mg 5 mg 2.5 mg    (2.5 mg1) (2.5 mg2) (2.5 mg1) (2.5 mg2) (2.5 mg1) (2.5 mg2) (2.5 mg1)

## 2012-08-08 ENCOUNTER — Ambulatory Visit (INDEPENDENT_AMBULATORY_CARE_PROVIDER_SITE_OTHER): Payer: Medicare Other | Admitting: Family

## 2012-08-08 DIAGNOSIS — I4891 Unspecified atrial fibrillation: Secondary | ICD-10-CM

## 2012-08-08 NOTE — Patient Instructions (Addendum)
Monday, Wednesday, Friday 2 tabs. All other days, 1 tab.  Recheck in 4 weeks.    Latest dosing instructions   Total Glynis Smiles Tue Wed Thu Fri Sat   25 2.5 mg 5 mg 2.5 mg 5 mg 2.5 mg 5 mg 2.5 mg    (2.5 mg1) (2.5 mg2) (2.5 mg1) (2.5 mg2) (2.5 mg1) (2.5 mg2) (2.5 mg1)

## 2012-08-19 ENCOUNTER — Ambulatory Visit (INDEPENDENT_AMBULATORY_CARE_PROVIDER_SITE_OTHER): Payer: Medicare Other | Admitting: Internal Medicine

## 2012-08-19 ENCOUNTER — Encounter: Payer: Self-pay | Admitting: Internal Medicine

## 2012-08-19 VITALS — BP 154/80 | HR 95 | Temp 97.7°F | Resp 24 | Ht 60.75 in | Wt 123.0 lb

## 2012-08-19 DIAGNOSIS — Z23 Encounter for immunization: Secondary | ICD-10-CM

## 2012-08-19 DIAGNOSIS — I10 Essential (primary) hypertension: Secondary | ICD-10-CM

## 2012-08-19 DIAGNOSIS — I959 Hypotension, unspecified: Secondary | ICD-10-CM

## 2012-08-19 DIAGNOSIS — E785 Hyperlipidemia, unspecified: Secondary | ICD-10-CM

## 2012-08-19 DIAGNOSIS — Z0000 Encounter for general adult medical examination without abnormal findings: Secondary | ICD-10-CM

## 2012-08-19 DIAGNOSIS — J449 Chronic obstructive pulmonary disease, unspecified: Secondary | ICD-10-CM

## 2012-08-19 DIAGNOSIS — I4891 Unspecified atrial fibrillation: Secondary | ICD-10-CM

## 2012-08-19 LAB — COMPREHENSIVE METABOLIC PANEL
ALT: 21 U/L (ref 0–53)
AST: 16 U/L (ref 0–37)
BUN: 26 mg/dL — ABNORMAL HIGH (ref 6–23)
Calcium: 8.8 mg/dL (ref 8.4–10.5)
Creatinine, Ser: 1.6 mg/dL — ABNORMAL HIGH (ref 0.4–1.5)
Total Bilirubin: 0.8 mg/dL (ref 0.3–1.2)

## 2012-08-19 LAB — CBC WITH DIFFERENTIAL/PLATELET
Basophils Absolute: 0 10*3/uL (ref 0.0–0.1)
Basophils Relative: 0 % (ref 0.0–3.0)
Eosinophils Absolute: 0.3 10*3/uL (ref 0.0–0.7)
HCT: 43.5 % (ref 39.0–52.0)
Hemoglobin: 14.2 g/dL (ref 13.0–17.0)
Lymphocytes Relative: 8.7 % — ABNORMAL LOW (ref 12.0–46.0)
Lymphs Abs: 1 10*3/uL (ref 0.7–4.0)
MCHC: 32.7 g/dL (ref 30.0–36.0)
Monocytes Relative: 9.2 % (ref 3.0–12.0)
Neutro Abs: 9.3 10*3/uL — ABNORMAL HIGH (ref 1.4–7.7)
RBC: 4.63 Mil/uL (ref 4.22–5.81)
RDW: 17 % — ABNORMAL HIGH (ref 11.5–14.6)

## 2012-08-19 LAB — LIPID PANEL
Cholesterol: 187 mg/dL (ref 0–200)
HDL: 77.2 mg/dL (ref >39.00)
LDL Cholesterol: 99 mg/dL (ref 0–99)
Total CHOL/HDL Ratio: 2
Triglycerides: 52 mg/dL (ref 0.0–149.0)
VLDL: 10.4 mg/dL (ref 0.0–40.0)

## 2012-08-19 MED ORDER — TETANUS-DIPHTH-ACELL PERTUSSIS 5-2.5-18.5 LF-MCG/0.5 IM SUSP: 0.5000 mL | Freq: Once | INTRAMUSCULAR | Status: DC

## 2012-08-19 NOTE — Patient Instructions (Signed)
Limit your sodium (Salt) intake  Return in 6 months for follow-up  

## 2012-08-19 NOTE — Progress Notes (Signed)
Patient ID: Stephen [name], male   DOB: 10-Jun-1941, 71 y.o.   MRN: [id number]  Subjective:    Patient ID: Stephen [name], male    DOB: [birth year], 1 y.o.   MRN: [id number]  HPI   71 -year-old patient who is seen today for a comprehensive annual evaluation. He has a history of COPD as well as hypertension.  He has chronic atrial for ablation and has been on chronic Coumadin anticoagulation. He has advanced oxygen-dependent COPD. He has been remarkably stable ; has been faithful with monthly INRs  1. Risk factors, based on past  M,S,F history   Coronary vascular risk factors include a history of hypertension  2.  Physical activities: Limited  by his pulmonary disease and dyspnea on exertion  3.  Depression/mood: history depression or mood disorder  4.  Hearing: no significant deficits  5.  ADL's: independent in all aspects of daily living  6.  Fall risk: Moderate  7.  Home safety: no problems identified  8.  Height weight, and visual acuity; height and weight stable no change in visual acuity  9.  Counseling: heart healthy diet encouraged ;  more exercise recommended  10. Lab orders based on risk factors: Laboratory studies will be reviewed  11. Referral-  None appropriate at this time 12. Care plan:  Heart healthy diet regular exercise encouraged colonoscopy in approximately 2 years  13. Cognitive assessment:  Alert and oriented with normal affect neurocognitive dysfunction      Vitals Entered By: Raechel Ache, RN (October 12, 2009 8:43 AM)  O2 Flow:  Room air CC: OV, fasting. C/o SOB, congestion.   CC:  OV, fasting. C/o SOB, and congestion.Marland Kitchen  History of Present Illness:  71 -year-old patient who is seen today for a comprehensive evaluation.  he has a history of advanced COPD, which has been fairly stable.  For the past few days, he has noticed an increasing cough and congestion is slightly worsened dyspnea on exertion.  He has treated hypertension, history of BPH and  also a history of paroxysmal atrial fibrillation. He is on maintenance Advair, as well as home nebulizer treatments with Xopenex.  No new concerns or complaints.  In general, his pulmonary status has been stable  Allergies:  1)  ! Penicillin V Potassium (Penicillin V Potassium)  Past History:  Past Medical History: Reviewed history from 04/20/2008 and no changes required. Asthma Hypertension COPD Atrial fibrillation Benign prostatic hypertrophy  Past Surgical History: Reviewed history from 06/25/2008 and no changes required. Tonsillectomy sigmoidoscopy 2002 colonoscopy 2007  Family History: Reviewed history from 05/28/2007 and no changes required. father died age 77 mother died age 55 apparently, an autoimmune  disorder one sister, health unknown  Social History: Reviewed history from 05/28/2007 and no changes required. wife deceased from colon cancer   Past Medical History  Diagnosis Date  . ASTHMA 04/22/2007  . ATRIAL FIBRILLATION, PAROXYSMAL 05/28/2007  . BENIGN PROSTATIC HYPERTROPHY 04/20/2008  . COPD 05/28/2007  . HYPERTENSION 04/22/2007  . HYPOTENSION 10/10/2010  . 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 Complication of anesthesia     difficulty waking up  . On home oxygen therapy     intermittent, uses with increased activity  . Shortness of breath   . Neuromuscular disorder   . Arthritis   . Peroneal neuropathy     L     History   Social History  . Marital Status: Widowed    Spouse Name: N/A  Number of Children: N/A  . Years of Education: N/A   Occupational History  . Not on file.   Social History Main Topics  . Smoking status: Former Smoker    Quit date: 09/18/1983  . Smokeless tobacco: Never Used  . Alcohol Use: No  . Drug Use: No  . Sexually Active: Not on file   Other Topics Concern  . Not on file   Social History Narrative  . No narrative on file    Past Surgical History  Procedure Date  . Tonsillectomy   . Colonoscopy      No family history on file.  Allergies  Allergen Reactions  . Penicillins Other (See Comments)    REACTION: feels drunk    Current Outpatient Prescriptions on File Prior to Visit  Medication Sig Dispense Refill  . albuterol (PROVENTIL HFA;VENTOLIN HFA) 108 (90 BASE) MCG/ACT inhaler Inhale 2 puffs into the lungs every 6 (six) hours as needed. For shortness of breath       . AMBULATORY NON FORMULARY MEDICATION Medication Name: oxygen 2 liters      . amiodarone (PACERONE) 200 MG tablet take 1 tablet by mouth once daily  30 tablet  6  . diltiazem (CARDIZEM CD) 360 MG 24 hr capsule Take 1 capsule (360 mg total) by mouth daily.  90 capsule  6  . diltiazem (TIAZAC) 360 MG 24 hr capsule       . Fluticasone-Salmeterol (ADVAIR DISKUS) 250-50 MCG/DOSE AEPB Inhale 1 puff into the lungs 2 (two) times daily.  60 each  6  . HYDROcodone-acetaminophen (NORCO/VICODIN) 5-325 MG per tablet take 1 tablet by mouth three times a day if needed for pain  90 tablet  0  . HYDROcodone-acetaminophen (VICODIN) 5-500 MG per tablet Take 1 tablet by mouth every 6 (six) hours as needed. For pain      . levalbuterol (XOPENEX) 1.25 MG/3ML nebulizer solution Take 1.25 mg by nebulization every 6 (six) hours as needed for wheezing.  360 mL  6  . Multiple Vitamin (MULTIVITAMIN) tablet Take 1 tablet by mouth daily.        . polyethylene glycol (MIRALAX / GLYCOLAX) packet Take 17 g by mouth daily. For constipation      . Tamsulosin HCl (FLOMAX) 0.4 MG CAPS Take 1 capsule (0.4 mg total) by mouth daily after supper.  60 capsule  4  . tiotropium (SPIRIVA) 18 MCG inhalation capsule Place 1 capsule (18 mcg total) into inhaler and inhale daily as needed. For asthma symptoms  30 capsule  6  . VENTOLIN HFA 108 (90 BASE) MCG/ACT inhaler inhale 2 puffs by mouth every 6 hours if needed for pain  54 g  6  . warfarin (COUMADIN) 2.5 MG tablet take UP to 2 tablets by mouth once daily as directed  180 tablet  2   No current  facility-administered medications on file prior to visit.    BP 154/80  Pulse 95  Temp 97.7 F (36.5 C) (Oral)  Resp 24  Ht 5' 0.75" (1.543 m)  Wt 123 lb (55.792 kg)  BMI 23.43 kg/m2  SpO2 89%     Review of Systems  Constitutional: Negative for fever, chills, activity change, appetite change and fatigue.  HENT: Negative for hearing loss, ear pain, congestion, rhinorrhea, sneezing, mouth sores, trouble swallowing, neck pain, neck stiffness, dental problem, voice change, sinus pressure and tinnitus.   Eyes: Negative for photophobia, pain, redness and visual disturbance.  Respiratory: Positive for shortness of breath. Negative for apnea,  cough, choking, chest tightness and wheezing.   Cardiovascular: Negative for chest pain, palpitations and leg swelling.  Gastrointestinal: Negative for nausea, vomiting, abdominal pain, diarrhea, constipation, blood in stool, abdominal distention, anal bleeding and rectal pain.  Genitourinary: Negative for dysuria, urgency, frequency, hematuria, flank pain, decreased urine volume, discharge, penile swelling, scrotal swelling, difficulty urinating, genital sores and testicular pain.  Musculoskeletal: Negative for myalgias, back pain, joint swelling, arthralgias and gait problem.  Skin: Negative for color change, rash and wound.  Neurological: Negative for dizziness, tremors, seizures, syncope, facial asymmetry, speech difficulty, weakness, light-headedness, numbness and headaches.  Hematological: Negative for adenopathy. Does not bruise/bleed easily.  Psychiatric/Behavioral: Negative for suicidal ideas, hallucinations, behavioral problems, confusion, sleep disturbance, self-injury, dysphoric mood, decreased concentration and agitation. The patient is not nervous/anxious.        Objective:   Physical Exam  Constitutional: He appears well-developed and well-nourished.  HENT:  Head: Normocephalic and atraumatic.  Right Ear: External ear normal.  Left  Ear: External ear normal.  Nose: Nose normal.  Mouth/Throat: Oropharynx is clear and moist.  Eyes: Conjunctivae normal and EOM are normal. Pupils are equal, round, and reactive to light. No scleral icterus.  Neck: Normal range of motion. Neck supple. No JVD present. No thyromegaly present.  Cardiovascular: Regular rhythm and normal heart sounds.  Exam reveals no gallop and no friction rub.   No murmur heard.      The left dorsalis pedis pulse absent  Pulmonary/Chest: Effort normal. He has wheezes. He exhibits no tenderness.        O2 saturation 94 95%   A few scattered rhonchi  Increased AP diameter  There are diminished breath sounds  Abdominal: Soft. Bowel sounds are normal. He exhibits no distension and no mass. There is no tenderness.  Genitourinary: Prostate normal and penis normal.  Musculoskeletal: Normal range of motion. He exhibits no edema and no tenderness.  Lymphadenopathy:    He has no cervical adenopathy.  Neurological: He is alert. He has normal reflexes. No cranial nerve deficit. Coordination normal.  Skin: Skin is warm and dry. No rash noted.  Psychiatric: He has a normal mood and affect. His behavior is normal.          Assessment & Plan:   preventive health examination  Hypertension- stable. We'll continue present regimen  COPD  BPH.

## 2012-08-20 ENCOUNTER — Other Ambulatory Visit: Payer: Self-pay | Admitting: Internal Medicine

## 2012-08-20 MED ORDER — LEVOTHYROXINE SODIUM 25 MCG PO TABS: ORAL_TABLET | ORAL | Status: DC

## 2012-08-20 NOTE — Addendum Note (Signed)
Addended by: Jimmye Norman on: 08/20/2012 01:45 PM   Modules accepted: Orders

## 2012-08-20 NOTE — Telephone Encounter (Signed)
Pt called and said that he was returning call from nurse. Pls call.

## 2012-09-05 ENCOUNTER — Ambulatory Visit (INDEPENDENT_AMBULATORY_CARE_PROVIDER_SITE_OTHER): Payer: Medicare Other | Admitting: Family

## 2012-09-05 DIAGNOSIS — I4891 Unspecified atrial fibrillation: Secondary | ICD-10-CM

## 2012-09-05 LAB — POCT INR: INR: 2.8

## 2012-09-05 NOTE — Patient Instructions (Signed)
Monday, Wednesday, Friday 2 tabs. All other days, 1 tab.  Recheck in 6 weeks.    Latest dosing instructions   Total Sun Mon Tue Wed Thu Fri Sat   25 2.5 mg 5 mg 2.5 mg 5 mg 2.5 mg 5 mg 2.5 mg    (2.5 mg1) (2.5 mg2) (2.5 mg1) (2.5 mg2) (2.5 mg1) (2.5 mg2) (2.5 mg1)        

## 2012-09-26 ENCOUNTER — Other Ambulatory Visit: Payer: Self-pay | Admitting: Internal Medicine

## 2012-09-26 MED ORDER — LEVALBUTEROL HCL 1.25 MG/3ML IN NEBU: 1.2500 mg | INHALATION_SOLUTION | Freq: Four times a day (QID) | RESPIRATORY_TRACT | Status: DC | PRN

## 2012-09-26 NOTE — Telephone Encounter (Signed)
Left detailed message that Rx for Levalbuterol was sent to pharmacy.

## 2012-09-26 NOTE — Telephone Encounter (Signed)
Pt needs new rx levalbuterol 1.25mg /86ml sent to [facility] [address]. Pt is switch pharm to [facility] [address]

## 2012-10-08 ENCOUNTER — Other Ambulatory Visit: Payer: Self-pay | Admitting: *Deleted

## 2012-10-08 MED ORDER — LEVALBUTEROL HCL 1.25 MG/3ML IN NEBU: 1.2500 mg | INHALATION_SOLUTION | Freq: Four times a day (QID) | RESPIRATORY_TRACT | Status: DC | PRN

## 2012-10-17 ENCOUNTER — Ambulatory Visit (INDEPENDENT_AMBULATORY_CARE_PROVIDER_SITE_OTHER): Payer: Medicare Other | Admitting: Family

## 2012-10-17 DIAGNOSIS — I4891 Unspecified atrial fibrillation: Secondary | ICD-10-CM

## 2012-10-17 LAB — POCT INR: INR: 2.5

## 2012-10-17 NOTE — Patient Instructions (Addendum)
Monday, Wednesday, Friday 2 tabs. All other days, 1 tab.  Recheck in 6 weeks.    Latest dosing instructions   Total Glynis Smiles Tue Wed Thu Fri Sat   25 2.5 mg 5 mg 2.5 mg 5 mg 2.5 mg 5 mg 2.5 mg    (2.5 mg1) (2.5 mg2) (2.5 mg1) (2.5 mg2) (2.5 mg1) (2.5 mg2) (2.5 mg1)

## 2012-11-04 ENCOUNTER — Other Ambulatory Visit: Payer: Self-pay | Admitting: Cardiology

## 2012-11-04 ENCOUNTER — Other Ambulatory Visit: Payer: Self-pay | Admitting: Internal Medicine

## 2012-11-28 ENCOUNTER — Ambulatory Visit (INDEPENDENT_AMBULATORY_CARE_PROVIDER_SITE_OTHER): Payer: Medicare Other | Admitting: Family

## 2012-11-28 DIAGNOSIS — I4891 Unspecified atrial fibrillation: Secondary | ICD-10-CM

## 2012-11-28 NOTE — Patient Instructions (Addendum)
Monday, Wednesday, Friday 2 tabs. All other days, 1 tab.  Recheck in 6 weeks.  Anticoagulation Dose Instructions as of 11/28/2012     Stephen [name] Tue Wed Thu Fri Sat   New Dose 2.5 mg 5 mg 2.5 mg 5 mg 2.5 mg 5 mg 2.5 mg    Description        Monday, Wednesday, Friday 2 tabs. All other days, 1 tab.  Recheck in 6 weeks.

## 2012-12-31 ENCOUNTER — Other Ambulatory Visit: Payer: Self-pay | Admitting: Internal Medicine

## 2013-01-04 ENCOUNTER — Other Ambulatory Visit: Payer: Self-pay | Admitting: Internal Medicine

## 2013-01-05 ENCOUNTER — Encounter: Payer: Self-pay | Admitting: *Deleted

## 2013-01-08 ENCOUNTER — Ambulatory Visit (INDEPENDENT_AMBULATORY_CARE_PROVIDER_SITE_OTHER): Payer: Medicare Other | Admitting: Family

## 2013-01-08 DIAGNOSIS — I4891 Unspecified atrial fibrillation: Secondary | ICD-10-CM

## 2013-01-08 LAB — POCT INR: INR: 1.7

## 2013-01-08 NOTE — Patient Instructions (Addendum)
Take 2 tabs today only. Then continue Monday, Wednesday, Friday 2 tabs. All other days, 1 tab.  Recheck in 4 weeks.  Anticoagulation Dose Instructions as of 01/08/2013     Glynis Smiles Tue Wed Thu Fri Sat   New Dose 2.5 mg 5 mg 2.5 mg 5 mg 2.5 mg 5 mg 2.5 mg    Description       Take 2 tabs today only. Then continue Monday, Wednesday, Friday 2 tabs. All other days, 1 tab.  Recheck in 4 weeks.

## 2013-01-09 ENCOUNTER — Encounter: Payer: Medicare Other | Admitting: Family

## 2013-02-03 ENCOUNTER — Ambulatory Visit (INDEPENDENT_AMBULATORY_CARE_PROVIDER_SITE_OTHER): Payer: Medicare Other | Admitting: Family

## 2013-02-03 DIAGNOSIS — I4891 Unspecified atrial fibrillation: Secondary | ICD-10-CM

## 2013-02-03 LAB — POCT INR: INR: 2.1

## 2013-02-03 NOTE — Patient Instructions (Addendum)
Continue Monday, Wednesday, Friday 2 tabs. All other days, 1 tab.  Recheck in 4 weeks.  Anticoagulation Dose Instructions as of 02/03/2013     Stephen [name] Tue Wed Thu Fri Sat   New Dose 2.5 mg 5 mg 2.5 mg 5 mg 2.5 mg 5 mg 2.5 mg    Description       Continue Monday, Wednesday, Friday 2 tabs. All other days, 1 tab.  Recheck in 4 weeks.

## 2013-02-17 ENCOUNTER — Ambulatory Visit: Payer: Medicare Other | Admitting: Internal Medicine

## 2013-02-24 ENCOUNTER — Ambulatory Visit (INDEPENDENT_AMBULATORY_CARE_PROVIDER_SITE_OTHER): Payer: Medicare Other | Admitting: Internal Medicine

## 2013-02-24 ENCOUNTER — Encounter: Payer: Self-pay | Admitting: Internal Medicine

## 2013-02-24 ENCOUNTER — Ambulatory Visit (INDEPENDENT_AMBULATORY_CARE_PROVIDER_SITE_OTHER): Payer: Medicare Other | Admitting: Family

## 2013-02-24 VITALS — BP 130/70 | HR 82 | Temp 98.2°F | Resp 20 | Wt 121.0 lb

## 2013-02-24 DIAGNOSIS — I4891 Unspecified atrial fibrillation: Secondary | ICD-10-CM

## 2013-02-24 DIAGNOSIS — J449 Chronic obstructive pulmonary disease, unspecified: Secondary | ICD-10-CM

## 2013-02-24 DIAGNOSIS — I10 Essential (primary) hypertension: Secondary | ICD-10-CM

## 2013-02-24 DIAGNOSIS — J4489 Other specified chronic obstructive pulmonary disease: Secondary | ICD-10-CM

## 2013-02-24 NOTE — Progress Notes (Signed)
Subjective:    Patient ID: Stephen [name], male    DOB: [birth year], 72 y.o.   MRN: [id number]  HPI  72 year old patient who has a history of severe oxygen-dependent COPD. He has done quite well over the past 6 months without any acute exacerbations. He has managed to complete  Two 9 hole rounds of golf this year. He has treated hypertension as well as paroxysmal atrial fibrillation. Remains on chronic Coumadin anticoagulation.  Past Medical History  Diagnosis Date  . ASTHMA 04/22/2007  . ATRIAL FIBRILLATION, PAROXYSMAL 05/28/2007  . BENIGN PROSTATIC HYPERTROPHY 04/20/2008  . COPD 05/28/2007  . HYPERTENSION 04/22/2007  . HYPOTENSION 10/10/2010  . 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 Complication of anesthesia     difficulty waking up  . On home oxygen therapy     intermittent, uses with increased activity  . Shortness of breath   . Neuromuscular disorder   . Arthritis   . Peroneal neuropathy     L     History   Social History  . Marital Status: Widowed    Spouse Name: N/A    Number of Children: N/A  . Years of Education: N/A   Occupational History  . Not on file.   Social History Main Topics  . Smoking status: Former Smoker    Quit date: 09/18/1983  . Smokeless tobacco: Never Used  . Alcohol Use: No  . Drug Use: No  . Sexually Active: Not on file   Other Topics Concern  . Not on file   Social History Narrative  . No narrative on file    Past Surgical History  Procedure Laterality Date  . Tonsillectomy    . Colonoscopy      No family history on file.  Allergies  Allergen Reactions  . Penicillins Other (See Comments)    REACTION: feels drunk    Current Outpatient Prescriptions on File Prior to Visit  Medication Sig Dispense Refill  . albuterol (PROVENTIL HFA;VENTOLIN HFA) 108 (90 BASE) MCG/ACT inhaler Inhale 2 puffs into the lungs every 6 (six) hours as needed. For shortness of breath       . AMBULATORY NON FORMULARY MEDICATION Medication Name:  oxygen 2 liters      . amiodarone (PACERONE) 200 MG tablet take 1 tablet by mouth once daily  30 tablet  6  . diltiazem (CARDIZEM CD) 360 MG 24 hr capsule Take 1 capsule (360 mg total) by mouth daily.  90 capsule  6  . Fluticasone-Salmeterol (ADVAIR DISKUS) 250-50 MCG/DOSE AEPB Inhale 1 puff into the lungs 2 (two) times daily.  60 each  6  . levalbuterol (XOPENEX) 1.25 MG/3ML nebulizer solution Take 1.25 mg by nebulization every 6 (six) hours as needed for wheezing.  360 mL  6  . levothyroxine (LEVOTHROID) 25 MCG tablet Take one tablet daily x 1 week, then increase to two tablets daily.  90 tablet  0  . Multiple Vitamin (MULTIVITAMIN) tablet Take 1 tablet by mouth daily.        . polyethylene glycol (MIRALAX / GLYCOLAX) packet Take 17 g by mouth daily as needed. For constipation      . SPIRIVA HANDIHALER 18 MCG inhalation capsule inhale the contents of one capsule in the handihaler once daily  30 capsule  6  . tamsulosin (FLOMAX) 0.4 MG CAPS take 1 capsule by mouth once daily after SUPPER  60 capsule  0  . VENTOLIN HFA 108 (90 BASE) MCG/ACT inhaler inhale 2 puffs by mouth  every 6 hours if needed for pain  54 g  6  . warfarin (COUMADIN) 2.5 MG tablet take UP TO 2 tablets by mouth once daily as directed  45 tablet  2   No current facility-administered medications on file prior to visit.    BP 130/70  Pulse 82  Temp(Src) 98.2 F (36.8 C) (Oral)  Resp 20  Wt 121 lb (54.885 kg)  BMI 23.05 kg/m2  SpO2 95%       Review of Systems  Constitutional: Negative for fever, chills, appetite change and fatigue.  HENT: Negative for hearing loss, ear pain, congestion, sore throat, trouble swallowing, neck stiffness, dental problem, voice change and tinnitus.   Eyes: Negative for pain, discharge and visual disturbance.  Respiratory: Positive for shortness of breath. Negative for cough, chest tightness, wheezing and stridor.   Cardiovascular: Negative for chest pain, palpitations and leg swelling.   Gastrointestinal: Negative for nausea, vomiting, abdominal pain, diarrhea, constipation, blood in stool and abdominal distention.  Genitourinary: Negative for urgency, hematuria, flank pain, discharge, difficulty urinating and genital sores.  Musculoskeletal: Negative for myalgias, back pain, joint swelling, arthralgias and gait problem.  Skin: Negative for rash.  Neurological: Negative for dizziness, syncope, speech difficulty, weakness, numbness and headaches.  Hematological: Negative for adenopathy. Does not bruise/bleed easily.  Psychiatric/Behavioral: Negative for behavioral problems and dysphoric mood. The patient is not nervous/anxious.        Objective:   Physical Exam  Constitutional: He is oriented to person, place, and time. He appears well-developed.  Nasal cannula O2 in place  HENT:  Head: Normocephalic.  Right Ear: External ear normal.  Left Ear: External ear normal.  Eyes: Conjunctivae and EOM are normal.  Neck: Normal range of motion.  Cardiovascular: Normal rate and normal heart sounds.   Pulmonary/Chest: Effort normal.  Breath sounds diminished. A few coarse rhonchi. No active wheezing  Abdominal: Bowel sounds are normal.  Musculoskeletal: Normal range of motion. He exhibits no edema and no tenderness.  Neurological: He is alert and oriented to person, place, and time.  Psychiatric: He has a normal mood and affect. His behavior is normal.          Assessment & Plan:   Advanced COPD. Stable hypertension stable Paroxysmal atrial fibrillation. Continue Coumadin anticoagulation  Recheck 6 months Meds updated

## 2013-02-24 NOTE — Patient Instructions (Signed)
Limit your sodium (Salt) intake  Return in 6 months for follow-up  

## 2013-02-24 NOTE — Patient Instructions (Addendum)
Eat plenty of greens. Continue Monday, Wednesday, Friday 2 tabs. All other days, 1 tab.  Recheck in 4 weeks.  Anticoagulation Dose Instructions as of 02/24/2013     Glynis Smiles Tue Wed Thu Fri Sat   New Dose 2.5 mg 5 mg 2.5 mg 5 mg 2.5 mg 5 mg 2.5 mg    Description       Eat plenty of greens. Continue Monday, Wednesday, Friday 2 tabs. All other days, 1 tab.  Recheck in 4 weeks.

## 2013-03-03 ENCOUNTER — Encounter: Payer: Medicare Other | Admitting: Family

## 2013-03-10 ENCOUNTER — Other Ambulatory Visit: Payer: Self-pay | Admitting: Internal Medicine

## 2013-03-16 ENCOUNTER — Other Ambulatory Visit: Payer: Self-pay | Admitting: Internal Medicine

## 2013-03-24 ENCOUNTER — Encounter: Payer: Medicare Other | Admitting: Family

## 2013-03-26 ENCOUNTER — Ambulatory Visit (INDEPENDENT_AMBULATORY_CARE_PROVIDER_SITE_OTHER): Payer: Medicare Other | Admitting: General Practice

## 2013-03-26 DIAGNOSIS — I4891 Unspecified atrial fibrillation: Secondary | ICD-10-CM

## 2013-03-31 ENCOUNTER — Other Ambulatory Visit: Payer: Self-pay | Admitting: Internal Medicine

## 2013-04-16 ENCOUNTER — Other Ambulatory Visit: Payer: Self-pay | Admitting: Internal Medicine

## 2013-04-22 ENCOUNTER — Ambulatory Visit (INDEPENDENT_AMBULATORY_CARE_PROVIDER_SITE_OTHER): Payer: Medicare Other | Admitting: General Practice

## 2013-04-22 DIAGNOSIS — I4891 Unspecified atrial fibrillation: Secondary | ICD-10-CM

## 2013-04-22 LAB — POCT INR: INR: 2.1

## 2013-05-20 ENCOUNTER — Ambulatory Visit (INDEPENDENT_AMBULATORY_CARE_PROVIDER_SITE_OTHER): Payer: Medicare Other | Admitting: General Practice

## 2013-05-20 DIAGNOSIS — I4891 Unspecified atrial fibrillation: Secondary | ICD-10-CM

## 2013-05-20 LAB — POCT INR: INR: 2.7

## 2013-06-22 ENCOUNTER — Telehealth: Payer: Self-pay | Admitting: Internal Medicine

## 2013-06-22 NOTE — Telephone Encounter (Signed)
Center manager states that they faxed over a certificate of medical necessity for Dr. Kirtland Bouchard to sign. She states that he switched to them from a company that went out of business, so they're trying to obtain his signature in order to get the patient his oxygen. Please assist.

## 2013-06-23 NOTE — Telephone Encounter (Signed)
Called Kim asked her to re fax form for oxygen for pt and we will fill out and get it back to her. Kim verbalized understanding.

## 2013-06-24 NOTE — Telephone Encounter (Signed)
Received form, filled out and faxed back to Selena Batten at [facility].

## 2013-06-30 ENCOUNTER — Ambulatory Visit (INDEPENDENT_AMBULATORY_CARE_PROVIDER_SITE_OTHER): Payer: Medicare Other | Admitting: General Practice

## 2013-06-30 DIAGNOSIS — I4891 Unspecified atrial fibrillation: Secondary | ICD-10-CM

## 2013-06-30 DIAGNOSIS — Z23 Encounter for immunization: Secondary | ICD-10-CM

## 2013-07-07 ENCOUNTER — Other Ambulatory Visit: Payer: Self-pay | Admitting: Internal Medicine

## 2013-07-15 ENCOUNTER — Other Ambulatory Visit: Payer: Self-pay | Admitting: Cardiology

## 2013-07-19 ENCOUNTER — Other Ambulatory Visit: Payer: Self-pay | Admitting: Internal Medicine

## 2013-07-21 ENCOUNTER — Other Ambulatory Visit: Payer: Self-pay | Admitting: Internal Medicine

## 2013-07-28 ENCOUNTER — Ambulatory Visit (INDEPENDENT_AMBULATORY_CARE_PROVIDER_SITE_OTHER): Payer: Medicare Other | Admitting: General Practice

## 2013-07-28 DIAGNOSIS — I4891 Unspecified atrial fibrillation: Secondary | ICD-10-CM

## 2013-07-28 NOTE — Progress Notes (Signed)
Pre-visit discussion using our clinic review tool. No additional management support is needed unless otherwise documented below in the visit note.  

## 2013-08-15 ENCOUNTER — Other Ambulatory Visit: Payer: Self-pay | Admitting: Cardiology

## 2013-08-18 ENCOUNTER — Other Ambulatory Visit: Payer: Self-pay | Admitting: *Deleted

## 2013-08-18 MED ORDER — AMIODARONE HCL 200 MG PO TABS: ORAL_TABLET | ORAL | Status: DC

## 2013-08-18 NOTE — Telephone Encounter (Signed)
We haven't seen patient here in cardiology since 2012, he would like to see if Dr Amador Cunas would refill pacerone, i will route him a note, I will send in a refill for 2 weeks to cover him for he only has 2 tablets let, we do not want him to be without this medication, he understand if Dr Kirtland Bouchard will not refill he will need to come to cardiology office.

## 2013-08-18 NOTE — Telephone Encounter (Signed)
We haven't seen patient here in cardiology since 2012, he would like to see if Dr Kwiatkowski would refill pacerone, i will route him a note, I will send in a refill for 2 weeks to cover him for he only has 2 tablets let, we do not want him to be without this medication, he understand if Dr K will not refill he will need to come to cardiology office. 

## 2013-08-26 ENCOUNTER — Encounter: Payer: Self-pay | Admitting: Internal Medicine

## 2013-08-26 ENCOUNTER — Ambulatory Visit (INDEPENDENT_AMBULATORY_CARE_PROVIDER_SITE_OTHER): Payer: Medicare Other | Admitting: Internal Medicine

## 2013-08-26 VITALS — BP 138/80 | HR 89 | Temp 98.3°F | Resp 20 | Wt 130.0 lb

## 2013-08-26 DIAGNOSIS — J4489 Other specified chronic obstructive pulmonary disease: Secondary | ICD-10-CM

## 2013-08-26 DIAGNOSIS — Z7901 Long term (current) use of anticoagulants: Secondary | ICD-10-CM

## 2013-08-26 DIAGNOSIS — I10 Essential (primary) hypertension: Secondary | ICD-10-CM

## 2013-08-26 DIAGNOSIS — I4891 Unspecified atrial fibrillation: Secondary | ICD-10-CM

## 2013-08-26 DIAGNOSIS — J449 Chronic obstructive pulmonary disease, unspecified: Secondary | ICD-10-CM

## 2013-08-26 NOTE — Progress Notes (Signed)
Pre-visit discussion using our clinic review tool. No additional management support is needed unless otherwise documented below in the visit note.  

## 2013-08-26 NOTE — Patient Instructions (Signed)
Limit your sodium (Salt) intake  Return in 6 months for follow-up  

## 2013-08-26 NOTE — Progress Notes (Signed)
Subjective:    Patient ID: Stephen [name], male    DOB: [birth year], 72 y.o.   MRN: [id number]  HPI  72 year old patient who has a history of advanced COPD who is seen today for his six-month followup. He is oxygen-dependent on inhalational medications. He has been relatively stable over the past 6 months. He has a history of essential hypertension as well as paroxysmal atrial fibrillation. Remains on Coumadin anticoagulation. He has hypothyroidism.  Past Medical History  Diagnosis Date  . ASTHMA 04/22/2007  . ATRIAL FIBRILLATION, PAROXYSMAL 05/28/2007  . BENIGN PROSTATIC HYPERTROPHY 04/20/2008  . COPD 05/28/2007  . HYPERTENSION 04/22/2007  . HYPOTENSION 10/10/2010  . 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 Complication of anesthesia     difficulty waking up  . On home oxygen therapy     intermittent, uses with increased activity  . Shortness of breath   . Neuromuscular disorder   . Arthritis   . Peroneal neuropathy     L     History   Social History  . Marital Status: Widowed    Spouse Name: N/A    Number of Children: N/A  . Years of Education: N/A   Occupational History  . Not on file.   Social History Main Topics  . Smoking status: Former Smoker    Quit date: 09/18/1983  . Smokeless tobacco: Never Used  . Alcohol Use: No  . Drug Use: No  . Sexual Activity: Not on file   Other Topics Concern  . Not on file   Social History Narrative  . No narrative on file    Past Surgical History  Procedure Laterality Date  . Tonsillectomy    . Colonoscopy      No family history on file.  Allergies  Allergen Reactions  . Penicillins Other (See Comments)    REACTION: feels drunk    Current Outpatient Prescriptions on File Prior to Visit  Medication Sig Dispense Refill  . ADVAIR DISKUS 250-50 MCG/DOSE AEPB inhale 1 dose by mouth twice a day  60 each  3  . albuterol (PROVENTIL HFA;VENTOLIN HFA) 108 (90 BASE) MCG/ACT inhaler Inhale 2 puffs into the lungs  every 6 (six) hours as needed. For shortness of breath      . AMBULATORY NON FORMULARY MEDICATION Medication Name: oxygen 2 liters      . amiodarone (PACERONE) 200 MG tablet take 1 tablet by mouth once daily  90 tablet  1  . diltiazem (TIAZAC) 360 MG 24 hr capsule take 1 capsule by mouth once daily  90 capsule  3  . levalbuterol (XOPENEX) 1.25 MG/3ML nebulizer solution Take 1.25 mg by nebulization every 6 (six) hours as needed for wheezing.  360 mL  6  . levothyroxine (LEVOTHROID) 25 MCG tablet Take one tablet daily x 1 week, then increase to two tablets daily.  90 tablet  0  . Multiple Vitamin (MULTIVITAMIN) tablet Take 1 tablet by mouth daily.        . polyethylene glycol (MIRALAX / GLYCOLAX) packet Take 17 g by mouth daily as needed. For constipation      . SPIRIVA HANDIHALER 18 MCG inhalation capsule inhale the contents of one capsule in the handihaler once daily  30 capsule  6  . tamsulosin (FLOMAX) 0.4 MG CAPS take 1 capsule by mouth once daily after SUPPER  90 capsule  1  . VENTOLIN HFA 108 (90 BASE) MCG/ACT inhaler inhale 2 puffs by mouth every 6 hours if needed for  pain  54 g  6  . warfarin (COUMADIN) 2.5 MG tablet take UP TO 2 tablets by mouth daily as directed  45 tablet  2   No current facility-administered medications on file prior to visit.    BP 138/80  Pulse 89  Temp(Src) 98.3 F (36.8 C) (Oral)  Resp 20  Wt 130 lb (58.968 kg)  SpO2 93%       Review of Systems  Constitutional: Positive for activity change. Negative for fever, chills, appetite change and fatigue.  HENT: Negative for congestion, dental problem, ear pain, hearing loss, sore throat, tinnitus, trouble swallowing and voice change.   Eyes: Negative for pain, discharge and visual disturbance.  Respiratory: Positive for shortness of breath. Negative for cough, chest tightness, wheezing and stridor.   Cardiovascular: Negative for chest pain, palpitations and leg swelling.  Gastrointestinal: Negative for  nausea, vomiting, abdominal pain, diarrhea, constipation, blood in stool and abdominal distention.  Genitourinary: Negative for urgency, hematuria, flank pain, discharge, difficulty urinating and genital sores.  Musculoskeletal: Positive for gait problem. Negative for arthralgias, back pain, joint swelling, myalgias and neck stiffness.  Skin: Negative for rash.  Neurological: Negative for dizziness, syncope, speech difficulty, weakness, numbness and headaches.  Hematological: Negative for adenopathy. Does not bruise/bleed easily.  Psychiatric/Behavioral: Negative for behavioral problems and dysphoric mood. The patient is not nervous/anxious.        Objective:   Physical Exam  Constitutional: He is oriented to person, place, and time. He appears well-developed.  Nasal cannula O2 Normal blood pressure  HENT:  Head: Normocephalic.  Right Ear: External ear normal.  Left Ear: External ear normal.  Eyes: Conjunctivae and EOM are normal.  Neck: Normal range of motion.  Cardiovascular: Normal rate and normal heart sounds.   Pulmonary/Chest: Effort normal. No respiratory distress.  Diminished breath sounds but clear  Abdominal: Bowel sounds are normal.  Musculoskeletal: Normal range of motion. He exhibits no edema and no tenderness.  Neurological: He is alert and oriented to person, place, and time.  Psychiatric: He has a normal mood and affect. His behavior is normal.          Assessment & Plan:   Advanced COPD. Clinically stable. We'll continue present inhalational regimen Hypertension stable Paroxysmal atrial fibrillation. Continue Coumadin anticoagulation  CPX 6 months

## 2013-08-28 ENCOUNTER — Telehealth: Payer: Self-pay | Admitting: *Deleted

## 2013-08-28 NOTE — Telephone Encounter (Signed)
Message copied by Carmela Hurt on Fri Aug 28, 2013  8:15 AM ------      Message from: Gordy Savers      Created: Tue Aug 18, 2013 12:33 PM      Regarding: RE: will you refill his pacerone       Yes, we will follow and refill      ----- Message -----         From: Carmela Hurt, RN         Sent: 08/18/2013   9:40 AM           To: Gordy Savers, MD, Carmela Hurt, RN      Subject: will you refill his pacerone                             Dr Kirtland Bouchard,                   We (cardiology) haven't seen patient here in cardiology since 2012, he would like to see if Dr Amador Cunas would refill pacerone, i will route him a note, I will send in a refill for 2 weeks to cover him for he only has 2 tablets let, we do not want him to be without this medication, he understands if Dr Kirtland Bouchard will not refill he will need to come to cardiology office.            Thanks, Addison Lank, RN Patient Care Advocate       ------

## 2013-09-03 ENCOUNTER — Other Ambulatory Visit: Payer: Self-pay | Admitting: Internal Medicine

## 2013-09-08 ENCOUNTER — Ambulatory Visit (INDEPENDENT_AMBULATORY_CARE_PROVIDER_SITE_OTHER): Payer: Medicare Other | Admitting: General Practice

## 2013-09-08 DIAGNOSIS — I4891 Unspecified atrial fibrillation: Secondary | ICD-10-CM

## 2013-09-08 NOTE — Progress Notes (Signed)
Pre-visit discussion using our clinic review tool. No additional management support is needed unless otherwise documented below in the visit note.  

## 2013-10-09 ENCOUNTER — Telehealth: Payer: Self-pay | Admitting: Internal Medicine

## 2013-10-09 NOTE — Telephone Encounter (Signed)
Please advise 

## 2013-10-09 NOTE — Telephone Encounter (Signed)
Patient Information:  Caller Name: Stephen [name]  Phone: [phone]  Patient: Stephen [name], Stephen [name]  Gender: Male  DOB: 08/18/1941  Age: 73 Years  PCP: Eleonore ChiquitoKwiatkowski, Peter (Family Practice > [age]yrs old)  Office Follow Up:  Does the office need to follow up with this patient?: Yes  Instructions For The Office: Appt offered and declined. Pt prefers an Rx to be called is as in the past, if possible. Pt expecting a return call to let him know if Rx can be called in or if he needs to be seen. Understands he might need to come in. What an enjoyable gentleman!   Symptoms  Reason For Call & Symptoms: Pt calling regarding chest congestion for a littel over a week. Has COPD. On 2 L of O2 at all times. Pt states he has slight SOB, only after activity (walking to the mailbox). Afebrile. No runny nose, sore throat or other sx. I/O wnl for pt. Has only had to use rescue inhaler x 1 with good successs. Using daily maint. inhalers as prescribed. Sputum is white to clear with some slight yellow in the am. Not interfering with ADL's. Pt requests something be called in rather than coming into office with flu and other exposures. States Dr. Kirtland BouchardK has done this is the past. Uses Pharmacy on file; [facility] on [address]  Reviewed Health History In EMR: Yes  Reviewed Medications In EMR: Yes  Reviewed Allergies In EMR: Yes  Reviewed Surgeries / Procedures: Yes  Date of Onset of Symptoms: 09/30/2013  Treatments Tried: Mucinex.  Treatments Tried Worked: Yes  Guideline(s) Used:  Cough  Disposition Per Guideline:   See Today in Office  Reason For Disposition Reached:   Known COPD or other severe lung disease (i.e., bronchiectasis, cystic fibrosis, lung surgery) and worsening symptoms (i.e., increased sputum purulence or amount, increased breathing difficulty)  Advice Given:  Reassurance  Coughing is the way that our lungs remove irritants and mucus. It helps protect our lungs from getting pneumonia.  You can get a dry hacking  cough after a chest cold. Sometimes this type of cough can last 1-3 weeks, and be worse at night.  You can also get a cough after being exposed to irritating substances like smoke, strong perfumes, and dust.  Here is some care advice that should help.  Expected Course:   The expected course depends on what is causing the cough.  Viral bronchitis (chest cold) causes a cough that lasts 1 to 3 weeks. Sometimes you may cough up lots of phlegm (sputum, mucus). The mucus can normally be white, gray, yellow, or green.  Call Back If:  Difficulty breathing occurs  Fever lasts more than 3 days  Nasal discharge lasts more than 10 days  Cough lasts more than 3 weeks  You become worse  Patient Will Follow Care Advice:  YES

## 2013-10-10 ENCOUNTER — Other Ambulatory Visit: Payer: Self-pay | Admitting: Family

## 2013-10-12 ENCOUNTER — Other Ambulatory Visit: Payer: Self-pay | Admitting: General Practice

## 2013-10-12 MED ORDER — WARFARIN SODIUM 2.5 MG PO TABS: ORAL_TABLET | ORAL | Status: DC

## 2013-10-12 NOTE — Telephone Encounter (Signed)
Called and discuseed

## 2013-10-20 ENCOUNTER — Ambulatory Visit (INDEPENDENT_AMBULATORY_CARE_PROVIDER_SITE_OTHER): Payer: Medicare Other | Admitting: General Practice

## 2013-10-20 DIAGNOSIS — Z5181 Encounter for therapeutic drug level monitoring: Secondary | ICD-10-CM

## 2013-10-20 DIAGNOSIS — I4891 Unspecified atrial fibrillation: Secondary | ICD-10-CM

## 2013-10-20 LAB — POCT INR: INR: 3.4

## 2013-10-20 NOTE — Progress Notes (Signed)
Pre-visit discussion using our clinic review tool. No additional management support is needed unless otherwise documented below in the visit note.  

## 2013-11-18 ENCOUNTER — Ambulatory Visit (INDEPENDENT_AMBULATORY_CARE_PROVIDER_SITE_OTHER): Payer: Medicare Other | Admitting: Family Medicine

## 2013-11-18 DIAGNOSIS — Z5181 Encounter for therapeutic drug level monitoring: Secondary | ICD-10-CM

## 2013-11-18 DIAGNOSIS — I4891 Unspecified atrial fibrillation: Secondary | ICD-10-CM

## 2013-11-18 LAB — POCT INR: INR: 2.8

## 2013-11-25 ENCOUNTER — Other Ambulatory Visit: Payer: Self-pay | Admitting: Internal Medicine

## 2013-12-03 ENCOUNTER — Other Ambulatory Visit: Payer: Self-pay | Admitting: Internal Medicine

## 2013-12-16 ENCOUNTER — Ambulatory Visit (INDEPENDENT_AMBULATORY_CARE_PROVIDER_SITE_OTHER): Payer: Medicare Other | Admitting: General Practice

## 2013-12-16 DIAGNOSIS — I4891 Unspecified atrial fibrillation: Secondary | ICD-10-CM

## 2013-12-16 DIAGNOSIS — Z5181 Encounter for therapeutic drug level monitoring: Secondary | ICD-10-CM

## 2013-12-16 LAB — POCT INR: INR: 2.5

## 2013-12-16 NOTE — Progress Notes (Signed)
Pre visit review using our clinic review tool, if applicable. No additional management support is needed unless otherwise documented below in the visit note. 

## 2014-01-15 ENCOUNTER — Ambulatory Visit (INDEPENDENT_AMBULATORY_CARE_PROVIDER_SITE_OTHER): Payer: Medicare Other | Admitting: General Practice

## 2014-01-15 DIAGNOSIS — Z5181 Encounter for therapeutic drug level monitoring: Secondary | ICD-10-CM

## 2014-01-15 DIAGNOSIS — I4891 Unspecified atrial fibrillation: Secondary | ICD-10-CM

## 2014-01-15 LAB — POCT INR: INR: 2.5

## 2014-01-15 NOTE — Progress Notes (Signed)
Pre visit review using our clinic review tool, if applicable. No additional management support is needed unless otherwise documented below in the visit note. 

## 2014-02-18 ENCOUNTER — Other Ambulatory Visit (INDEPENDENT_AMBULATORY_CARE_PROVIDER_SITE_OTHER): Payer: Medicare Other

## 2014-02-18 DIAGNOSIS — Z0000 Encounter for general adult medical examination without abnormal findings: Secondary | ICD-10-CM

## 2014-02-18 DIAGNOSIS — I10 Essential (primary) hypertension: Secondary | ICD-10-CM

## 2014-02-18 DIAGNOSIS — I4891 Unspecified atrial fibrillation: Secondary | ICD-10-CM

## 2014-02-18 LAB — HEPATIC FUNCTION PANEL
ALK PHOS: 89 U/L (ref 39–117)
ALT: 25 U/L (ref 0–53)
AST: 24 U/L (ref 0–37)
Albumin: 3.7 g/dL (ref 3.5–5.2)
Bilirubin, Direct: 0.2 mg/dL (ref 0.0–0.3)
TOTAL PROTEIN: 5.8 g/dL — AB (ref 6.0–8.3)
Total Bilirubin: 0.9 mg/dL (ref 0.2–1.2)

## 2014-02-18 LAB — BASIC METABOLIC PANEL
BUN: 21 mg/dL (ref 6–23)
CHLORIDE: 103 meq/L (ref 96–112)
CO2: 23 mEq/L (ref 19–32)
CREATININE: 1.5 mg/dL (ref 0.4–1.5)
Calcium: 9.1 mg/dL (ref 8.4–10.5)
GFR: 49.4 mL/min — ABNORMAL LOW (ref >60.00)
Glucose, Bld: 98 mg/dL (ref 70–99)
Potassium: 4.2 mEq/L (ref 3.5–5.1)
SODIUM: 136 meq/L (ref 135–145)

## 2014-02-18 LAB — LIPID PANEL
Cholesterol: 198 mg/dL (ref 0–200)
HDL: 64.6 mg/dL (ref >39.00)
LDL Cholesterol: 123 mg/dL — ABNORMAL HIGH (ref 0–99)
NONHDL: 133.4
TRIGLYCERIDES: 52 mg/dL (ref 0.0–149.0)
Total CHOL/HDL Ratio: 3
VLDL: 10.4 mg/dL (ref 0.0–40.0)

## 2014-02-18 LAB — CBC WITH DIFFERENTIAL/PLATELET
BASOS PCT: 0.1 % (ref 0.0–3.0)
Basophils Absolute: 0 10*3/uL (ref 0.0–0.1)
EOS PCT: 3.3 % (ref 0.0–5.0)
Eosinophils Absolute: 0.3 10*3/uL (ref 0.0–0.7)
HCT: 41.6 % (ref 39.0–52.0)
Hemoglobin: 13.4 g/dL (ref 13.0–17.0)
Lymphocytes Relative: 8.9 % — ABNORMAL LOW (ref 12.0–46.0)
Lymphs Abs: 0.9 10*3/uL (ref 0.7–4.0)
MCHC: 32.3 g/dL (ref 30.0–36.0)
MCV: 92.2 fl (ref 78.0–100.0)
MONO ABS: 1 10*3/uL (ref 0.1–1.0)
Monocytes Relative: 9.5 % (ref 3.0–12.0)
Neutro Abs: 8.1 10*3/uL — ABNORMAL HIGH (ref 1.4–7.7)
Neutrophils Relative %: 78.2 % — ABNORMAL HIGH (ref 43.0–77.0)
Platelets: 316 10*3/uL (ref 150.0–400.0)
RBC: 4.51 Mil/uL (ref 4.22–5.81)
RDW: 16.1 % — ABNORMAL HIGH (ref 11.5–15.5)
WBC: 10.3 10*3/uL (ref 4.0–10.5)

## 2014-02-18 LAB — PSA: PSA: 1.54 ng/mL (ref 0.10–4.00)

## 2014-02-18 LAB — POCT URINALYSIS DIPSTICK
BILIRUBIN UA: NEGATIVE
GLUCOSE UA: NEGATIVE
KETONES UA: NEGATIVE
Nitrite, UA: NEGATIVE
PH UA: 7
Protein, UA: NEGATIVE
RBC UA: NEGATIVE
Spec Grav, UA: 1.015
Urobilinogen, UA: 0.2

## 2014-02-18 LAB — TSH: TSH: 10.66 u[IU]/mL — ABNORMAL HIGH (ref 0.35–4.50)

## 2014-02-24 ENCOUNTER — Other Ambulatory Visit: Payer: Self-pay | Admitting: General Practice

## 2014-02-24 ENCOUNTER — Ambulatory Visit (INDEPENDENT_AMBULATORY_CARE_PROVIDER_SITE_OTHER): Payer: Medicare Other | Admitting: General Practice

## 2014-02-24 DIAGNOSIS — Z5181 Encounter for therapeutic drug level monitoring: Secondary | ICD-10-CM

## 2014-02-24 DIAGNOSIS — I4891 Unspecified atrial fibrillation: Secondary | ICD-10-CM

## 2014-02-24 LAB — POCT INR: INR: 2.6

## 2014-02-24 NOTE — Progress Notes (Signed)
Pre visit review using our clinic review tool, if applicable. No additional management support is needed unless otherwise documented below in the visit note. 

## 2014-02-25 ENCOUNTER — Encounter: Payer: Medicare Other | Admitting: Internal Medicine

## 2014-02-25 ENCOUNTER — Other Ambulatory Visit: Payer: Self-pay | Admitting: Internal Medicine

## 2014-02-25 ENCOUNTER — Ambulatory Visit: Payer: Medicare Other

## 2014-03-02 ENCOUNTER — Other Ambulatory Visit: Payer: Self-pay | Admitting: Internal Medicine

## 2014-03-03 ENCOUNTER — Encounter: Payer: Self-pay | Admitting: Internal Medicine

## 2014-03-03 ENCOUNTER — Ambulatory Visit (INDEPENDENT_AMBULATORY_CARE_PROVIDER_SITE_OTHER): Payer: Medicare Other | Admitting: Internal Medicine

## 2014-03-03 VITALS — BP 116/70 | HR 88 | Temp 98.1°F | Resp 20 | Ht 62.0 in | Wt 125.0 lb

## 2014-03-03 DIAGNOSIS — Z0000 Encounter for general adult medical examination without abnormal findings: Secondary | ICD-10-CM

## 2014-03-03 DIAGNOSIS — I4891 Unspecified atrial fibrillation: Secondary | ICD-10-CM

## 2014-03-03 DIAGNOSIS — I10 Essential (primary) hypertension: Secondary | ICD-10-CM

## 2014-03-03 DIAGNOSIS — J4489 Other specified chronic obstructive pulmonary disease: Secondary | ICD-10-CM

## 2014-03-03 DIAGNOSIS — Z23 Encounter for immunization: Secondary | ICD-10-CM

## 2014-03-03 DIAGNOSIS — J449 Chronic obstructive pulmonary disease, unspecified: Secondary | ICD-10-CM

## 2014-03-03 MED ORDER — LEVOTHYROXINE SODIUM 75 MCG PO TABS: ORAL_TABLET | ORAL | Status: DC

## 2014-03-03 MED ORDER — TAMSULOSIN HCL 0.4 MG PO CAPS: ORAL_CAPSULE | ORAL | Status: DC

## 2014-03-03 NOTE — Patient Instructions (Signed)
Limit your sodium (Salt) intake  Return in 6 months for follow-up  

## 2014-03-03 NOTE — Progress Notes (Signed)
Pre-visit discussion using our clinic review tool. No additional management support is needed unless otherwise documented below in the visit note.  

## 2014-03-03 NOTE — Progress Notes (Signed)
Patient ID: Stephen [name], male   DOB: August 17, 1941, 73 y.o.   MRN: [id number]  Subjective:    Patient ID: Stephen [name], male    DOB: August 17, 1941, 73 y.o.   MRN: [id number]  HPI  73 -year-old patient who is seen today for a comprehensive annual evaluation.   He has a history of COPD as well as hypertension.  He has chronic atrial fibrillation and has been on chronic Coumadin anticoagulation. He has advanced oxygen-dependent COPD. He has been remarkably stable ; has been faithful with monthly INRs  Medicare wellness  1. Risk factors, based on past  M,[name],F history   Coronary vascular risk factors include a history of hypertension  2.  Physical activities: Limited  by his pulmonary disease and dyspnea on exertion  3.  Depression/mood: history depression or mood disorder  4.  Hearing: no significant deficits  5.  ADL'[name]: independent in all aspects of daily living  6.  Fall risk: Moderate  7.  Home safety: no problems identified  8.  Height weight, and visual acuity; height and weight stable no change in visual acuity  9.  Counseling: heart healthy diet encouraged ;  more exercise recommended  10. Lab orders based on risk factors: Laboratory studies will be reviewed  11. Referral-  None appropriate at this time 12. Care plan:  Heart healthy diet regular exercise encouraged colonoscopy in approximately 2 years  13. Cognitive assessment:  Alert and oriented with normal affect neurocognitive dysfunction     Allergies:  1)  ! Penicillin V Potassium (Penicillin V Potassium)  Past History:  Past Medical History:  Asthma Hypertension COPD Atrial fibrillation Benign prostatic hypertrophy  Past Surgical History:  Tonsillectomy sigmoidoscopy 2002 colonoscopy 2007  Family History:  father died age 74 mother died age 74 apparently, an autoimmune  disorder one sister, health unknown  Social History:  wife deceased from colon cancer   Past Medical History   Diagnosis Date  . ASTHMA 04/22/2007  . ATRIAL FIBRILLATION, PAROXYSMAL 05/28/2007  . BENIGN PROSTATIC HYPERTROPHY 04/20/2008  . COPD 05/28/2007  . HYPERTENSION 04/22/2007  . HYPOTENSION 10/10/2010  . 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 Complication of anesthesia     difficulty waking up  . On home oxygen therapy     intermittent, uses with increased activity  . Shortness of breath   . Neuromuscular disorder   . Arthritis   . Peroneal neuropathy     L     History   Social History  . Marital Status: Widowed    Spouse Name: N/A    Number of Children: N/A  . Years of Education: N/A   Occupational History  . Not on file.   Social History Main Topics  . Smoking status: Former Smoker    Quit date: 09/18/1983  . Smokeless tobacco: Never Used  . Alcohol Use: No  . Drug Use: No  . Sexual Activity: Not on file   Other Topics Concern  . Not on file   Social History Narrative  . No narrative on file    Past Surgical History  Procedure Laterality Date  . Tonsillectomy    . Colonoscopy      No family history on file.  Allergies  Allergen Reactions  . Penicillins Other (See Comments)    REACTION: feels drunk    Current Outpatient Prescriptions on File Prior to Visit  Medication Sig Dispense Refill  . ADVAIR DISKUS 250-50 MCG/DOSE AEPB inhale 1 dose by mouth twice  a day  60 each  5  . albuterol (PROVENTIL HFA;VENTOLIN HFA) 108 (90 BASE) MCG/ACT inhaler Inhale 2 puffs into the lungs every 6 (six) hours as needed. For shortness of breath      . AMBULATORY NON FORMULARY MEDICATION Medication Name: oxygen 2 liters      . diltiazem (TIAZAC) 360 MG 24 hr capsule take 1 capsule by mouth once daily  90 capsule  3  . levalbuterol (XOPENEX) 1.25 MG/3ML nebulizer solution inhale contents of 1 vial in nebulizer every 6 hours  360 mL  5  . levothyroxine (LEVOTHROID) 25 MCG tablet Take one tablet daily x 1 week, then increase to two tablets daily.  90 tablet  0  . Multiple  Vitamin (MULTIVITAMIN) tablet Take 1 tablet by mouth daily.        Marland Kitchen. PACERONE 200 MG tablet take 1 tablet by mouth once daily  90 tablet  1  . polyethylene glycol (MIRALAX / GLYCOLAX) packet Take 17 g by mouth daily as needed. For constipation      . SPIRIVA HANDIHALER 18 MCG inhalation capsule inhale contents of 1 capsule by mouth once daily  30 capsule  6  . tamsulosin (FLOMAX) 0.4 MG CAPS capsule take 1 capsule by mouth once daily after SUPPER  90 capsule  1  . warfarin (COUMADIN) 2.5 MG tablet take as directed BY ANTICOAGULATION CLINIC  50 tablet  3   No current facility-administered medications on file prior to visit.    BP 116/70  Pulse 88  Temp(Src) 98.1 F (36.7 C) (Oral)  Resp 20  Ht 5\' 2"  (1.575 m)  Wt 125 lb (56.7 kg)  BMI 22.86 kg/m2  SpO2 95%     Review of Systems  Constitutional: Negative for fever, chills, activity change, appetite change and fatigue.  HENT: Negative for congestion, dental problem, ear pain, hearing loss, mouth sores, rhinorrhea, sinus pressure, sneezing, tinnitus, trouble swallowing and voice change.   Eyes: Negative for photophobia, pain, redness and visual disturbance.  Respiratory: Positive for shortness of breath. Negative for apnea, cough, choking, chest tightness and wheezing.   Cardiovascular: Negative for chest pain, palpitations and leg swelling.  Gastrointestinal: Negative for nausea, vomiting, abdominal pain, diarrhea, constipation, blood in stool, abdominal distention, anal bleeding and rectal pain.  Genitourinary: Negative for dysuria, urgency, frequency, hematuria, flank pain, decreased urine volume, discharge, penile swelling, scrotal swelling, difficulty urinating, genital sores and testicular pain.  Musculoskeletal: Negative for arthralgias, back pain, gait problem, joint swelling, myalgias, neck pain and neck stiffness.  Skin: Negative for color change, rash and wound.  Neurological: Negative for dizziness, tremors, seizures,  syncope, facial asymmetry, speech difficulty, weakness, light-headedness, numbness and headaches.  Hematological: Negative for adenopathy. Does not bruise/bleed easily.  Psychiatric/Behavioral: Negative for suicidal ideas, hallucinations, behavioral problems, confusion, sleep disturbance, self-injury, dysphoric mood, decreased concentration and agitation. The patient is not nervous/anxious.        Objective:   Physical Exam  Constitutional: He appears well-developed and well-nourished.  HENT:  Head: Normocephalic and atraumatic.  Right Ear: External ear normal.  Left Ear: External ear normal.  Nose: Nose normal.  Mouth/Throat: Oropharynx is clear and moist.  Eyes: Conjunctivae and EOM are normal. Pupils are equal, round, and reactive to light. No scleral icterus.  Neck: Normal range of motion. Neck supple. No JVD present. No thyromegaly present.  Cardiovascular: Regular rhythm and normal heart sounds.  Exam reveals no gallop and no friction rub.   No murmur heard. The left dorsalis pedis  pulse absent  Pulmonary/Chest: Effort normal. He has wheezes. He exhibits no tenderness.   O2 saturation 94 95%   Increased AP diameter  There are diminished breath sounds  Abdominal: Soft. Bowel sounds are normal. He exhibits no distension and no mass. There is no tenderness.  Small umbilical hernia  Genitourinary: Prostate normal and penis normal.  Musculoskeletal: Normal range of motion. He exhibits no edema and no tenderness.  Lymphadenopathy:    He has no cervical adenopathy.  Neurological: He is alert. He has normal reflexes. No cranial nerve deficit. Coordination normal.  Skin: Skin is warm and dry. No rash noted.  Psychiatric: He has a normal mood and affect. His behavior is normal.          Assessment & Plan:   preventive health examination  Hypertension- stable. We'll continue present regimen  COPD  BPH.  Hypothyroid.  Elevated TSH.  Will up titrate levo thyroxine.  Recheck TSH  next visit

## 2014-03-04 ENCOUNTER — Telehealth: Payer: Self-pay | Admitting: Internal Medicine

## 2014-03-04 ENCOUNTER — Other Ambulatory Visit: Payer: Self-pay | Admitting: Internal Medicine

## 2014-03-04 NOTE — Telephone Encounter (Signed)
Relevant patient education assigned to patient using Emmi. ° °

## 2014-03-04 NOTE — Telephone Encounter (Signed)
Pt states he forgot that he needs levothyroxine (LEVOTHROID) 75 MCG tablet the patient states dr Kirtland Bouchardk wants him to take this. [facility] [address] Pt was in yesterday for cpe

## 2014-03-04 NOTE — Telephone Encounter (Signed)
Left detailed message that Rx for Levothyroxine was sent to pharmacy yesterday any questions please call.

## 2014-04-07 ENCOUNTER — Ambulatory Visit (INDEPENDENT_AMBULATORY_CARE_PROVIDER_SITE_OTHER): Payer: Medicare Other | Admitting: General Practice

## 2014-04-07 DIAGNOSIS — Z5181 Encounter for therapeutic drug level monitoring: Secondary | ICD-10-CM

## 2014-04-07 LAB — POCT INR: INR: 3.6

## 2014-04-07 NOTE — Progress Notes (Signed)
Pre visit review using our clinic review tool, if applicable. No additional management support is needed unless otherwise documented below in the visit note. 

## 2014-04-12 ENCOUNTER — Other Ambulatory Visit: Payer: Self-pay | Admitting: Internal Medicine

## 2014-04-21 ENCOUNTER — Other Ambulatory Visit: Payer: Self-pay | Admitting: Internal Medicine

## 2014-04-21 NOTE — Telephone Encounter (Signed)
Dr. Kirtland BouchardK, please clarify dosage of Levothyroxine?

## 2014-05-05 ENCOUNTER — Ambulatory Visit (INDEPENDENT_AMBULATORY_CARE_PROVIDER_SITE_OTHER): Payer: Medicare Other | Admitting: *Deleted

## 2014-05-05 DIAGNOSIS — Z5181 Encounter for therapeutic drug level monitoring: Secondary | ICD-10-CM

## 2014-05-05 DIAGNOSIS — I4891 Unspecified atrial fibrillation: Secondary | ICD-10-CM

## 2014-05-05 LAB — POCT INR: INR: 2.9

## 2014-05-25 ENCOUNTER — Other Ambulatory Visit: Payer: Self-pay | Admitting: Internal Medicine

## 2014-06-02 ENCOUNTER — Ambulatory Visit (INDEPENDENT_AMBULATORY_CARE_PROVIDER_SITE_OTHER): Payer: Medicare Other | Admitting: *Deleted

## 2014-06-02 DIAGNOSIS — Z5181 Encounter for therapeutic drug level monitoring: Secondary | ICD-10-CM

## 2014-06-02 DIAGNOSIS — Z23 Encounter for immunization: Secondary | ICD-10-CM

## 2014-06-02 DIAGNOSIS — I4891 Unspecified atrial fibrillation: Secondary | ICD-10-CM

## 2014-06-02 LAB — POCT INR: INR: 2.4

## 2014-06-09 ENCOUNTER — Other Ambulatory Visit: Payer: Self-pay | Admitting: Internal Medicine

## 2014-06-10 NOTE — Telephone Encounter (Signed)
ok 

## 2014-06-10 NOTE — Telephone Encounter (Signed)
Dr. Kirtland Bouchard , please clarify dosage pt is suppose to be taking is it 75 mcg daily or 2 tablets 75 mcg (150 mcg) daily?

## 2014-07-14 ENCOUNTER — Telehealth: Payer: Self-pay | Admitting: Internal Medicine

## 2014-07-14 MED ORDER — MECLIZINE HCL 25 MG PO TABS: 25.0000 mg | ORAL_TABLET | Freq: Four times a day (QID) | ORAL | Status: DC | PRN

## 2014-07-14 NOTE — Telephone Encounter (Signed)
Please see message and advise 

## 2014-07-14 NOTE — Telephone Encounter (Signed)
Meclizine 25 mg #60 one 4  times a day as needed for vertigo

## 2014-07-14 NOTE — Telephone Encounter (Signed)
Spoke to pt, told him Rx for Meclizine was sent to pharmacy to help with dizziness. Pt verbalized understanding and asked if anything was sent for his cold. Told pt no, can use OTC expectorant like Mucinex DM and cough syrup. Pt verbalized understanding.

## 2014-07-14 NOTE — Telephone Encounter (Signed)
Pt is having veritgo and chest/head cold no fever. Pt decline OV [facility] [address]. Pt would like something call into pharm. Pt stated md has called something to pharm in past

## 2014-07-15 ENCOUNTER — Telehealth: Payer: Self-pay | Admitting: Family

## 2014-07-15 NOTE — Telephone Encounter (Signed)
lmom for pt to cb

## 2014-07-15 NOTE — Telephone Encounter (Signed)
Please call and set up a PT/INR visit for patient asap.  

## 2014-07-15 NOTE — Telephone Encounter (Signed)
Pt requested Stephen [name] he has been scheduled for Wednesday 07/21/14

## 2014-07-20 ENCOUNTER — Telehealth: Payer: Self-pay | Admitting: Internal Medicine

## 2014-07-20 NOTE — Telephone Encounter (Signed)
Done

## 2014-07-20 NOTE — Telephone Encounter (Signed)
Can you please enter coum lab into system for patient to go downstairs.  Please let me know once done.  Thanks!

## 2014-07-20 NOTE — Telephone Encounter (Signed)
Called patient and notified

## 2014-07-21 ENCOUNTER — Other Ambulatory Visit: Payer: Self-pay | Admitting: Geriatric Medicine

## 2014-07-21 ENCOUNTER — Ambulatory Visit: Payer: Medicare Other

## 2014-07-21 ENCOUNTER — Other Ambulatory Visit (INDEPENDENT_AMBULATORY_CARE_PROVIDER_SITE_OTHER): Payer: Medicare Other

## 2014-07-21 DIAGNOSIS — Z7901 Long term (current) use of anticoagulants: Secondary | ICD-10-CM

## 2014-07-21 DIAGNOSIS — Z5181 Encounter for therapeutic drug level monitoring: Secondary | ICD-10-CM

## 2014-07-21 LAB — PROTIME-INR
INR: 2.9 ratio — AB (ref 0.8–1.0)
Prothrombin Time: 31.5 s — ABNORMAL HIGH (ref 9.6–13.1)

## 2014-07-22 ENCOUNTER — Ambulatory Visit (INDEPENDENT_AMBULATORY_CARE_PROVIDER_SITE_OTHER): Payer: Medicare Other | Admitting: Family

## 2014-07-22 ENCOUNTER — Telehealth: Payer: Self-pay | Admitting: Family

## 2014-07-22 DIAGNOSIS — I482 Chronic atrial fibrillation, unspecified: Secondary | ICD-10-CM

## 2014-07-22 DIAGNOSIS — Z5181 Encounter for therapeutic drug level monitoring: Secondary | ICD-10-CM

## 2014-07-22 LAB — POCT INR: INR: 2.9

## 2014-07-22 NOTE — Patient Instructions (Signed)
Continue to take 1 tablet daily except 2 tablets on Monday/Friday.  Re-check in 6 weeks.  Anticoagulation Dose Instructions as of 07/22/2014      Glynis SmilesSun Mon Tue Wed Thu Fri Sat   New Dose 2.5 mg 5 mg 2.5 mg 2.5 mg 2.5 mg 5 mg 2.5 mg    Description        Continue to take 1 tablet daily except 2 tablets on Monday/Friday.  Re-check in 6 weeks.

## 2014-07-22 NOTE — Telephone Encounter (Signed)
Left detailed message on personally identified voicemail to advise pt of instructions and advised calling [facility] to schedule next appt

## 2014-07-22 NOTE — Telephone Encounter (Signed)
Please call and schedule at [facility]: Continue to take 1 tablet daily except 2 tablets on Monday/Friday.  Re-check in 6 weeks.

## 2014-07-27 ENCOUNTER — Telehealth: Payer: Self-pay | Admitting: Internal Medicine

## 2014-07-28 ENCOUNTER — Encounter: Payer: Self-pay | Admitting: Internal Medicine

## 2014-07-28 ENCOUNTER — Ambulatory Visit (INDEPENDENT_AMBULATORY_CARE_PROVIDER_SITE_OTHER): Payer: Medicare Other | Admitting: Internal Medicine

## 2014-07-28 VITALS — BP 110/60 | HR 100 | Temp 97.6°F | Resp 22 | Ht 62.0 in | Wt 108.0 lb

## 2014-07-28 DIAGNOSIS — Z7901 Long term (current) use of anticoagulants: Secondary | ICD-10-CM

## 2014-07-28 DIAGNOSIS — I951 Orthostatic hypotension: Secondary | ICD-10-CM

## 2014-07-28 DIAGNOSIS — J449 Chronic obstructive pulmonary disease, unspecified: Secondary | ICD-10-CM

## 2014-07-28 MED ORDER — DILTIAZEM HCL ER 120 MG PO CP24: 120.0000 mg | ORAL_CAPSULE | Freq: Every day | ORAL | Status: DC

## 2014-07-28 NOTE — Progress Notes (Signed)
Subjective:    Patient ID: Stephen [name], male    DOB: 30-Jul-1941, 73 y.o.   MRN: [id number]  HPI 73 year old patient who has a history of severe COPD, oxygen dependent.  He made a phone contact with the office on 05/14/2014 complaining of dizziness.  He has had a history of mild vertigo in the past and requested a refill of meclizine.  This has not improved the dizziness he describes more of a sense of lightheadedness and weakness, but no true vertigo.  Symptoms are often orthostatic  Past Medical History  Diagnosis Date  . ASTHMA 04/22/2007  . ATRIAL FIBRILLATION, PAROXYSMAL 05/28/2007  . BENIGN PROSTATIC HYPERTROPHY 04/20/2008  . COPD 05/28/2007  . HYPERTENSION 04/22/2007  . HYPOTENSION 10/10/2010  . 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 Complication of anesthesia     difficulty waking up  . On home oxygen therapy     intermittent, uses with increased activity  . Shortness of breath   . Neuromuscular disorder   . Arthritis   . Peroneal neuropathy     L     History   Social History  . Marital Status: Widowed    Spouse Name: N/A    Number of Children: N/A  . Years of Education: N/A   Occupational History  . Not on file.   Social History Main Topics  . Smoking status: Former Smoker    Quit date: 09/18/1983  . Smokeless tobacco: Never Used  . Alcohol Use: No  . Drug Use: No  . Sexual Activity: Not on file   Other Topics Concern  . Not on file   Social History Narrative    Past Surgical History  Procedure Laterality Date  . Tonsillectomy    . Colonoscopy      No family history on file.  Allergies  Allergen Reactions  . Penicillins Other (See Comments)    REACTION: feels drunk    Current Outpatient Prescriptions on File Prior to Visit  Medication Sig Dispense Refill  . ADVAIR DISKUS 250-50 MCG/DOSE AEPB inhale 1 dose by mouth twice a day 60 each 5  . albuterol (PROVENTIL HFA;VENTOLIN HFA) 108 (90 BASE) MCG/ACT inhaler Inhale 2 puffs into the  lungs every 6 (six) hours as needed. For shortness of breath    . AMBULATORY NON FORMULARY MEDICATION Medication Name: oxygen 2 liters    . diltiazem (TIAZAC) 360 MG 24 hr capsule take 1 capsule by mouth once daily 90 capsule 1  . levalbuterol (XOPENEX) 1.25 MG/3ML nebulizer solution inhale contents of 1 vial in nebulizer every 6 hours 360 mL 5  . levothyroxine (SYNTHROID, LEVOTHROID) 75 MCG tablet Take 1 tablet (75 mcg total) by mouth daily before breakfast. 90 tablet 0  . meclizine (ANTIVERT) 25 MG tablet Take 1 tablet (25 mg total) by mouth 4 (four) times daily as needed for dizziness. 60 tablet 0  . Multiple Vitamin (MULTIVITAMIN) tablet Take 1 tablet by mouth daily.      Marland Kitchen. PACERONE 200 MG tablet take 1 tablet by mouth once daily 90 tablet 1  . polyethylene glycol (MIRALAX / GLYCOLAX) packet Take 17 g by mouth daily as needed. For constipation    . SPIRIVA HANDIHALER 18 MCG inhalation capsule inhale contents of 1 capsule by mouth once daily 30 capsule 6  . tamsulosin (FLOMAX) 0.4 MG CAPS capsule 1 tablet twice daily 180 capsule 4  . warfarin (COUMADIN) 2.5 MG tablet take as directed BY ANTICOAGULATION CLINIC 50 tablet 3  No current facility-administered medications on file prior to visit.    BP 110/60 mmHg  Pulse 100  Temp(Src) 97.6 F (36.4 C) (Oral)  Resp 22  Ht 5\' 2"  (1.575 m)  Wt 108 lb (48.988 kg)  BMI 19.75 kg/m2  SpO2 95%      Review of Systems  Constitutional: Positive for fatigue.  Neurological: Positive for dizziness and light-headedness.       Objective:   Physical Exam  Constitutional: He is oriented to person, place, and time. He appears well-developed.  Blood pressure on arrival 110 over 60 Follow-up blood pressure 80/55  HENT:  Head: Normocephalic.  Right Ear: External ear normal.  Left Ear: External ear normal.  Eyes: Conjunctivae and EOM are normal.  Neck: Normal range of motion.  Cardiovascular: Normal rate and normal heart sounds.     Pulmonary/Chest: Effort normal.  Diminished breath sounds Nasal oxygen in place O2 saturation 95  Abdominal: Bowel sounds are normal.  Musculoskeletal: Normal range of motion. He exhibits no edema or tenderness.  Neurological: He is alert and oriented to person, place, and time.  Psychiatric: He has a normal mood and affect. His behavior is normal.          Assessment & Plan:   Hypotension.  Will decrease diltiazem to 120 mg daily Paroxysmal atrial fibrillation.  Continue Coumadin anticoagulation COPD continue present inhalational medications and supplemental O2  Recheck 6 months or as needed

## 2014-07-28 NOTE — Progress Notes (Signed)
Pre visit review using our clinic review tool, if applicable. No additional management support is needed unless otherwise documented below in the visit note. 

## 2014-07-28 NOTE — Patient Instructions (Signed)
Decrease diltiazem to 120 mg daily  Limit your sodium (Salt) intake  Return in 6 months for follow-up  Call or return to clinic prn if these symptoms worsen or fail to improve as anticipated.

## 2014-07-31 ENCOUNTER — Other Ambulatory Visit: Payer: Self-pay | Admitting: Internal Medicine

## 2014-08-02 NOTE — Telephone Encounter (Signed)
Pharmacy states below re-fill request was not received and ask to re-send

## 2014-08-03 MED ORDER — WARFARIN SODIUM 2.5 MG PO TABS: ORAL_TABLET | ORAL | Status: DC

## 2014-08-03 NOTE — Addendum Note (Signed)
Addended by: Jimmye NormanPHANOS, Cynitha Berte J on: 08/03/2014 09:24 AM   Modules accepted: Orders

## 2014-08-03 NOTE — Telephone Encounter (Signed)
Rx faxed to pharmacy  

## 2014-08-04 ENCOUNTER — Telehealth: Payer: Self-pay

## 2014-08-04 MED ORDER — WARFARIN SODIUM 2.5 MG PO TABS: ORAL_TABLET | ORAL | Status: DC

## 2014-08-04 NOTE — Telephone Encounter (Signed)
Unable to escribe prescription. Rx phoned in to [facility] on [address].  Pt aware.    Next INR:  09/01/14 @ 2 pm.

## 2014-08-30 ENCOUNTER — Other Ambulatory Visit: Payer: Self-pay | Admitting: Internal Medicine

## 2014-09-01 ENCOUNTER — Telehealth: Payer: Self-pay | Admitting: Family

## 2014-09-01 ENCOUNTER — Ambulatory Visit (INDEPENDENT_AMBULATORY_CARE_PROVIDER_SITE_OTHER): Payer: Medicare Other

## 2014-09-01 DIAGNOSIS — Z5181 Encounter for therapeutic drug level monitoring: Secondary | ICD-10-CM

## 2014-09-01 DIAGNOSIS — I4891 Unspecified atrial fibrillation: Secondary | ICD-10-CM

## 2014-09-01 LAB — POCT INR: INR: 3.5

## 2014-09-01 NOTE — Telephone Encounter (Signed)
Agree with plan 

## 2014-09-02 ENCOUNTER — Ambulatory Visit: Payer: Medicare Other | Admitting: Internal Medicine

## 2014-09-03 ENCOUNTER — Other Ambulatory Visit: Payer: Self-pay | Admitting: Internal Medicine

## 2014-09-05 ENCOUNTER — Other Ambulatory Visit: Payer: Self-pay | Admitting: Cardiology

## 2014-09-07 ENCOUNTER — Ambulatory Visit: Payer: Medicare Other | Admitting: Internal Medicine

## 2014-09-07 ENCOUNTER — Other Ambulatory Visit: Payer: Self-pay | Admitting: Internal Medicine

## 2014-09-15 ENCOUNTER — Ambulatory Visit (INDEPENDENT_AMBULATORY_CARE_PROVIDER_SITE_OTHER): Payer: Medicare Other | Admitting: Family Medicine

## 2014-09-15 DIAGNOSIS — Z5181 Encounter for therapeutic drug level monitoring: Secondary | ICD-10-CM

## 2014-09-15 DIAGNOSIS — I4891 Unspecified atrial fibrillation: Secondary | ICD-10-CM

## 2014-09-15 LAB — POCT INR: INR: 3.2

## 2014-09-20 ENCOUNTER — Telehealth: Payer: Self-pay | Admitting: Internal Medicine

## 2014-09-20 NOTE — Telephone Encounter (Signed)
Please call pt and schedule an appointment to be evaluated, can not just call in antibiotic without pt being assessed.

## 2014-09-20 NOTE — Telephone Encounter (Signed)
Pt has COPD and thinks he contracted pne from his daughter over the holidays. Does not want an appt but would like to have something called into the pharmacy to help him feel better. Uses [facility] on [address].

## 2014-09-21 ENCOUNTER — Ambulatory Visit (INDEPENDENT_AMBULATORY_CARE_PROVIDER_SITE_OTHER): Payer: PPO | Admitting: Internal Medicine

## 2014-09-21 ENCOUNTER — Encounter: Payer: Self-pay | Admitting: Internal Medicine

## 2014-09-21 VITALS — BP 110/60 | HR 97 | Temp 97.9°F | Resp 24

## 2014-09-21 DIAGNOSIS — Z7901 Long term (current) use of anticoagulants: Secondary | ICD-10-CM

## 2014-09-21 DIAGNOSIS — J441 Chronic obstructive pulmonary disease with (acute) exacerbation: Secondary | ICD-10-CM

## 2014-09-21 DIAGNOSIS — J449 Chronic obstructive pulmonary disease, unspecified: Secondary | ICD-10-CM

## 2014-09-21 DIAGNOSIS — I10 Essential (primary) hypertension: Secondary | ICD-10-CM

## 2014-09-21 MED ORDER — PREDNISONE 10 MG PO TABS: ORAL_TABLET | ORAL | Status: DC

## 2014-09-21 MED ORDER — METHYLPREDNISOLONE ACETATE 80 MG/ML IJ SUSP
80.0000 mg | Freq: Once | INTRAMUSCULAR | Status: AC
  Administered 2014-09-21: 80 mg via INTRAMUSCULAR

## 2014-09-21 MED ORDER — CEFTRIAXONE SODIUM 1 G IJ SOLR
1.0000 g | Freq: Once | INTRAMUSCULAR | Status: AC
  Administered 2014-09-21: 1 g via INTRAMUSCULAR

## 2014-09-21 MED ORDER — AZITHROMYCIN 250 MG PO TABS: ORAL_TABLET | ORAL | Status: DC

## 2014-09-21 NOTE — Progress Notes (Signed)
Subjective:    Patient ID: Stephen [name], male    DOB: 10-10-1940, 74 y.o.   MRN: [id number]  HPI  74 year old patient who has a history of advanced COPD, oxygen dependent.  He has done poorly over the past 7-10 days with decrease in energy level, increasing chest congestion and cough.  He has had worsening dyspnea and has been expectorating clear sputum. In spite of maximal outpatient therapy which includes nebulizer treatments and maintenance bronchodilators.  He has continued to deteriorate. He has a history of paroxysmal atrial fibrillation and remains on chronic Coumadin anticoagulation.  Past Medical History  Diagnosis Date  . ASTHMA 04/22/2007  . ATRIAL FIBRILLATION, PAROXYSMAL 05/28/2007  . BENIGN PROSTATIC HYPERTROPHY 04/20/2008  . COPD 05/28/2007  . HYPERTENSION 04/22/2007  . HYPOTENSION 10/10/2010  . 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 Complication of anesthesia     difficulty waking up  . On home oxygen therapy     intermittent, uses with increased activity  . Shortness of breath   . Neuromuscular disorder   . Arthritis   . Peroneal neuropathy     L     History   Social History  . Marital Status: Widowed    Spouse Name: N/A    Number of Children: N/A  . Years of Education: N/A   Occupational History  . Not on file.   Social History Main Topics  . Smoking status: Former Smoker    Quit date: 09/18/1983  . Smokeless tobacco: Never Used  . Alcohol Use: No  . Drug Use: No  . Sexual Activity: Not on file   Other Topics Concern  . Not on file   Social History Narrative    Past Surgical History  Procedure Laterality Date  . Tonsillectomy    . Colonoscopy      No family history on file.  Allergies  Allergen Reactions  . Penicillins Other (See Comments)    REACTION: feels drunk    Current Outpatient Prescriptions on File Prior to Visit  Medication Sig Dispense Refill  . ADVAIR DISKUS 250-50 MCG/DOSE AEPB inhale 1 dose by mouth twice  a day 60 each 5  . albuterol (PROVENTIL HFA;VENTOLIN HFA) 108 (90 BASE) MCG/ACT inhaler Inhale 2 puffs into the lungs every 6 (six) hours as needed. For shortness of breath    . AMBULATORY NON FORMULARY MEDICATION Medication Name: oxygen 2 liters    . diltiazem (DILACOR XR) 120 MG 24 hr capsule Take 1 capsule (120 mg total) by mouth daily. 90 capsule 4  . levalbuterol (XOPENEX) 1.25 MG/3ML nebulizer solution inhale contents of 1 vial in nebulizer every 6 hours 360 mL 5  . levothyroxine (SYNTHROID, LEVOTHROID) 75 MCG tablet take 1 tablet by mouth every morning ON AN EMPTY STOMACH 90 tablet 0  . meclizine (ANTIVERT) 25 MG tablet Take 1 tablet (25 mg total) by mouth 4 (four) times daily as needed for dizziness. 60 tablet 0  . Multiple Vitamin (MULTIVITAMIN) tablet Take 1 tablet by mouth daily.      Marland Kitchen. PACERONE 200 MG tablet take 1 tablet by mouth once daily 90 tablet 1  . polyethylene glycol (MIRALAX / GLYCOLAX) packet Take 17 g by mouth daily as needed. For constipation    . SPIRIVA HANDIHALER 18 MCG inhalation capsule inhale contents of 1 capsule by mouth once daily 30 capsule 6  . tamsulosin (FLOMAX) 0.4 MG CAPS capsule 1 tablet twice daily 180 capsule 4  . warfarin (COUMADIN) 2.5 MG tablet  As of 07/22/14 Continue to take 1 tablet (2.5mg ) daily except 2 tablets on Monday/Friday.  Follow the Instructions from the coumadin clinic nurse. 50 tablet 3   No current facility-administered medications on file prior to visit.    BP 110/60 mmHg  Pulse 97  Temp(Src) 97.9 F (36.6 C) (Oral)  Resp 24  SpO2 96%     Review of Systems  Constitutional: Positive for fatigue. Negative for fever, chills and appetite change.  HENT: Negative for congestion, dental problem, ear pain, hearing loss, sore throat, tinnitus, trouble swallowing and voice change.   Eyes: Negative for pain, discharge and visual disturbance.  Respiratory: Positive for cough, shortness of breath and wheezing. Negative for chest tightness  and stridor.   Cardiovascular: Negative for chest pain, palpitations and leg swelling.  Gastrointestinal: Negative for nausea, vomiting, abdominal pain, diarrhea, constipation, blood in stool and abdominal distention.  Genitourinary: Negative for urgency, hematuria, flank pain, discharge, difficulty urinating and genital sores.  Musculoskeletal: Negative for myalgias, back pain, joint swelling, arthralgias, gait problem and neck stiffness.  Skin: Negative for rash.  Neurological: Positive for weakness. Negative for dizziness, syncope, speech difficulty, numbness and headaches.  Hematological: Negative for adenopathy. Does not bruise/bleed easily.  Psychiatric/Behavioral: Negative for behavioral problems and dysphoric mood. The patient is not nervous/anxious.        Objective:   Physical Exam  Constitutional: He is oriented to person, place, and time. He appears well-developed.  HENT:  Head: Normocephalic.  Right Ear: External ear normal.  Left Ear: External ear normal.  Eyes: Conjunctivae and EOM are normal.  Neck: Normal range of motion.  Cardiovascular: Normal rate, regular rhythm and normal heart sounds.   No tachycardia  Pulmonary/Chest:  Scattered coarse rhonchi Prolonged expiratory phase O2 sat ration 96 on soft, mobile, oxygen Mild resting tachypnea  No significant increased work of breathing  Abdominal: Bowel sounds are normal.  Musculoskeletal: Normal range of motion. He exhibits no edema or tenderness.  Neurological: He is alert and oriented to person, place, and time.  Psychiatric: He has a normal mood and affect. His behavior is normal.          Assessment & Plan:   Exacerbation COPD.  Options discussed including immediate hospitalization.  He wishes to defer unless there is clinical worsening.  Will treat with Rocephin 1 g IM and Solu-Medrol 80 mg IM.  Will place on azithromycin and a prednisone Dosepak.  Will continue home nebulizer treatments every 6 hours as  well as maintenance bronchodilators.  He'll report to the hospital for admission.  If any clinical deterioration Chronic Coumadin anticoagulation.  This to be held for 3 days while on azithromycin Paroxysmal atrial fibrillation

## 2014-09-21 NOTE — Patient Instructions (Addendum)
Hold Coumadin for 3 days and then resume after completion of antibiotics  Take your antibiotic as prescribed until ALL of it is gone, but stop if you develop a rash, swelling, or any side effects of the medication.  Contact our office as soon as possible if  there are side effects of the medication.  Report to the emergency department for evaluation and consideration of possible admission if there is any clinical worseningChronic Obstructive Pulmonary Disease Exacerbation Chronic obstructive pulmonary disease (COPD) is a common lung condition in which airflow from the lungs is limited. COPD is a general term that can be used to describe many different lung problems that limit airflow, including chronic bronchitis and emphysema. COPD exacerbations are episodes when breathing symptoms become much worse and require extra treatment. Without treatment, COPD exacerbations can be life threatening, and frequent COPD exacerbations can cause further damage to your lungs. CAUSES   Respiratory infections.   Exposure to smoke.   Exposure to air pollution, chemical fumes, or dust. Sometimes there is no apparent cause or trigger. RISK FACTORS  Smoking cigarettes.  Older age.  Frequent prior COPD exacerbations. SIGNS AND SYMPTOMS   Increased coughing.   Increased thick spit (sputum) production.   Increased wheezing.   Increased shortness of breath.   Rapid breathing.   Chest tightness. DIAGNOSIS  Your medical history, a physical exam, and tests will help your health care provider make a diagnosis. Tests may include:  A chest X-ray.  Basic lab tests.  Sputum testing.  An arterial blood gas test. TREATMENT  Depending on the severity of your COPD exacerbation, you may need to be admitted to a hospital for treatment. Some of the treatments commonly used to treat COPD exacerbations are:   Antibiotic medicines.   Bronchodilators. These are drugs that expand the air passages. They  may be given with an inhaler or nebulizer. Spacer devices may be needed to help improve drug delivery.  Corticosteroid medicines.  Supplemental oxygen therapy.  HOME CARE INSTRUCTIONS   Do not smoke. Quitting smoking is very important to prevent COPD from getting worse and exacerbations from happening as often.  Avoid exposure to all substances that irritate the airway, especially to tobacco smoke.   If you were prescribed an antibiotic medicine, finish it all even if you start to feel better.  Take all medicines as directed by your health care provider.It is important to use correct technique with inhaled medicines.  Drink enough fluids to keep your urine clear or pale yellow (unless you have a medical condition that requires fluid restriction).  Use a cool mist vaporizer. This makes it easier to clear your chest when you cough.   If you have a home nebulizer and oxygen, continue to use them as directed.   Maintain all necessary vaccinations to prevent infections.   Exercise regularly.   Eat a healthy diet.   Keep all follow-up appointments as directed by your health care provider. SEEK IMMEDIATE MEDICAL CARE IF:  You have worsening shortness of breath.   You have trouble talking.   You have severe chest pain.  You have blood in your sputum.  You have a fever.  You have weakness, vomit repeatedly, or faint.   You feel confused.   You continue to get worse. MAKE SURE YOU:   Understand these instructions.  Will watch your condition.  Will get help right away if you are not doing well or get worse. Document Released: 07/01/2007 Document Revised: 01/18/2014 Document Reviewed: 05/08/2013 [employer]  Patient Information 2015 [employer]. This information is not intended to replace advice given to you by your health care provider. Make sure you discuss any questions you have with your health care provider.  

## 2014-09-21 NOTE — Progress Notes (Signed)
Pre visit review using our clinic review tool, if applicable. No additional management support is needed unless otherwise documented below in the visit note. 

## 2014-09-28 NOTE — Telephone Encounter (Signed)
Encounter closed

## 2014-10-13 ENCOUNTER — Ambulatory Visit (INDEPENDENT_AMBULATORY_CARE_PROVIDER_SITE_OTHER): Payer: PPO

## 2014-10-13 DIAGNOSIS — I4891 Unspecified atrial fibrillation: Secondary | ICD-10-CM

## 2014-10-13 DIAGNOSIS — Z5181 Encounter for therapeutic drug level monitoring: Secondary | ICD-10-CM

## 2014-10-13 LAB — POCT INR: INR: 3

## 2014-10-19 ENCOUNTER — Encounter: Payer: Self-pay | Admitting: Internal Medicine

## 2014-10-19 ENCOUNTER — Ambulatory Visit (INDEPENDENT_AMBULATORY_CARE_PROVIDER_SITE_OTHER): Payer: PPO | Admitting: Internal Medicine

## 2014-10-19 VITALS — BP 132/70 | HR 95 | Temp 98.2°F | Resp 24 | Ht 62.0 in | Wt 107.0 lb

## 2014-10-19 DIAGNOSIS — I10 Essential (primary) hypertension: Secondary | ICD-10-CM

## 2014-10-19 DIAGNOSIS — I480 Paroxysmal atrial fibrillation: Secondary | ICD-10-CM

## 2014-10-19 DIAGNOSIS — J449 Chronic obstructive pulmonary disease, unspecified: Secondary | ICD-10-CM

## 2014-10-19 MED ORDER — MECLIZINE HCL 25 MG PO TABS: 25.0000 mg | ORAL_TABLET | Freq: Four times a day (QID) | ORAL | Status: AC | PRN

## 2014-10-19 NOTE — Patient Instructions (Signed)
Limit your sodium (Salt) intake  Call or return to clinic prn if these symptoms worsen or fail to improve as anticipated.   

## 2014-10-19 NOTE — Progress Notes (Signed)
Subjective:    Patient ID: Stephen [name], male    DOB: [birth year], 74 y.o.   MRN: [id number]  HPI 74 year old patient who is seen today in follow-up.  He was seen 1 month ago with exacerbation of COPD and was treated aggressively.  He now is back to baseline.  He has advanced COPD, oxygen dependent.  He now is back to his usual daily activities.  He drove himself to the office today feels quite well without new concerns or complaints.  Past Medical History  Diagnosis Date  . ASTHMA 04/22/2007  . ATRIAL FIBRILLATION, PAROXYSMAL 05/28/2007  . BENIGN PROSTATIC HYPERTROPHY 04/20/2008  . COPD 05/28/2007  . HYPERTENSION 04/22/2007  . HYPOTENSION 10/10/2010  . 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 Complication of anesthesia     difficulty waking up  . On home oxygen therapy     intermittent, uses with increased activity  . Shortness of breath   . Neuromuscular disorder   . Arthritis   . Peroneal neuropathy     L     History   Social History  . Marital Status: Widowed    Spouse Name: N/A    Number of Children: N/A  . Years of Education: N/A   Occupational History  . Not on file.   Social History Main Topics  . Smoking status: Former Smoker    Quit date: 09/18/1983  . Smokeless tobacco: Never Used  . Alcohol Use: No  . Drug Use: No  . Sexual Activity: Not on file   Other Topics Concern  . Not on file   Social History Narrative    Past Surgical History  Procedure Laterality Date  . Tonsillectomy    . Colonoscopy      No family history on file.  Allergies  Allergen Reactions  . Penicillins Other (See Comments)    REACTION: feels drunk    Current Outpatient Prescriptions on File Prior to Visit  Medication Sig Dispense Refill  . ADVAIR DISKUS 250-50 MCG/DOSE AEPB inhale 1 dose by mouth twice a day 60 each 5  . albuterol (PROVENTIL HFA;VENTOLIN HFA) 108 (90 BASE) MCG/ACT inhaler Inhale 2 puffs into the lungs every 6 (six) hours as needed. For shortness  of breath    . AMBULATORY NON FORMULARY MEDICATION Medication Name: oxygen 2 liters    . diltiazem (DILACOR XR) 120 MG 24 hr capsule Take 1 capsule (120 mg total) by mouth daily. 90 capsule 4  . levalbuterol (XOPENEX) 1.25 MG/3ML nebulizer solution inhale contents of 1 vial in nebulizer every 6 hours 360 mL 5  . levothyroxine (SYNTHROID, LEVOTHROID) 75 MCG tablet take 1 tablet by mouth every morning ON AN EMPTY STOMACH 90 tablet 0  . meclizine (ANTIVERT) 25 MG tablet Take 1 tablet (25 mg total) by mouth 4 (four) times daily as needed for dizziness. 60 tablet 0  . Multiple Vitamin (MULTIVITAMIN) tablet Take 1 tablet by mouth daily.      Marland Kitchen PACERONE 200 MG tablet take 1 tablet by mouth once daily 90 tablet 1  . polyethylene glycol (MIRALAX / GLYCOLAX) packet Take 17 g by mouth daily as needed. For constipation    . SPIRIVA HANDIHALER 18 MCG inhalation capsule inhale contents of 1 capsule by mouth once daily 30 capsule 6  . tamsulosin (FLOMAX) 0.4 MG CAPS capsule 1 tablet twice daily 180 capsule 4  . warfarin (COUMADIN) 2.5 MG tablet As of 07/22/14 Continue to take 1 tablet (2.5mg ) daily except 2 tablets on  Monday/Friday.  Follow the Instructions from the coumadin clinic nurse. 50 tablet 3   No current facility-administered medications on file prior to visit.    BP 132/70 mmHg  Pulse 95  Temp(Src) 98.2 F (36.8 C) (Oral)  Resp 24  Ht 5\' 2"  (1.575 m)  Wt 107 lb (48.535 kg)  BMI 19.57 kg/m2  SpO2 97%      Review of Systems  Constitutional: Negative for fever, chills, appetite change and fatigue.  HENT: Negative for congestion, dental problem, ear pain, hearing loss, sore throat, tinnitus, trouble swallowing and voice change.   Eyes: Negative for pain, discharge and visual disturbance.  Respiratory: Positive for shortness of breath. Negative for cough, chest tightness, wheezing and stridor.   Cardiovascular: Negative for chest pain, palpitations and leg swelling.  Gastrointestinal:  Negative for nausea, vomiting, abdominal pain, diarrhea, constipation, blood in stool and abdominal distention.  Genitourinary: Negative for urgency, hematuria, flank pain, discharge, difficulty urinating and genital sores.  Musculoskeletal: Negative for myalgias, back pain, joint swelling, arthralgias, gait problem and neck stiffness.  Skin: Negative for rash.  Neurological: Negative for dizziness, syncope, speech difficulty, weakness, numbness and headaches.  Hematological: Negative for adenopathy. Does not bruise/bleed easily.  Psychiatric/Behavioral: Negative for behavioral problems and dysphoric mood. The patient is not nervous/anxious.        Objective:   Physical Exam  Constitutional: He is oriented to person, place, and time. He appears well-developed and well-nourished. No distress.  Nasal cannula O2 Blood pressure 130/70  HENT:  Head: Normocephalic.  Right Ear: External ear normal.  Left Ear: External ear normal.  Eyes: Conjunctivae and EOM are normal.  Neck: Normal range of motion.  Cardiovascular: Normal rate and normal heart sounds.   Pulmonary/Chest: Effort normal. No respiratory distress. He has no wheezes. He has no rales.  Decreased breath sounds Clear O2 saturation 97  Abdominal: Bowel sounds are normal.  Musculoskeletal: Normal range of motion. He exhibits no edema or tenderness.  Neurological: He is alert and oriented to person, place, and time.  Psychiatric: He has a normal mood and affect. His behavior is normal.          Assessment & Plan:   Advanced COPD, oxygen dependent.  Clinically stable Paroxysmal atrial fibrillation Chronic Coumadin anticoagulation Hypothyroidism  No change in medical therapy.  Recheck 3 months or as needed

## 2014-10-19 NOTE — Progress Notes (Signed)
Pre visit review using our clinic review tool, if applicable. No additional management support is needed unless otherwise documented below in the visit note. 

## 2014-11-10 ENCOUNTER — Ambulatory Visit (INDEPENDENT_AMBULATORY_CARE_PROVIDER_SITE_OTHER): Payer: PPO | Admitting: General Practice

## 2014-11-10 DIAGNOSIS — Z5181 Encounter for therapeutic drug level monitoring: Secondary | ICD-10-CM

## 2014-11-10 DIAGNOSIS — I4891 Unspecified atrial fibrillation: Secondary | ICD-10-CM

## 2014-11-10 LAB — POCT INR: INR: 2

## 2014-11-10 NOTE — Progress Notes (Signed)
Pre visit review using our clinic review tool, if applicable. No additional management support is needed unless otherwise documented below in the visit note. 

## 2014-11-10 NOTE — Progress Notes (Signed)
Agree with plan 

## 2014-11-12 ENCOUNTER — Other Ambulatory Visit: Payer: Self-pay | Admitting: Internal Medicine

## 2014-11-30 ENCOUNTER — Other Ambulatory Visit: Payer: Self-pay | Admitting: Internal Medicine

## 2014-11-30 NOTE — Telephone Encounter (Signed)
Sent to the pharmacy by e-scribe.  Pt has upcoming appt on 01/27/15

## 2014-12-06 ENCOUNTER — Other Ambulatory Visit: Payer: Self-pay | Admitting: Internal Medicine

## 2014-12-08 ENCOUNTER — Ambulatory Visit (INDEPENDENT_AMBULATORY_CARE_PROVIDER_SITE_OTHER): Payer: PPO | Admitting: General Practice

## 2014-12-08 DIAGNOSIS — Z5181 Encounter for therapeutic drug level monitoring: Secondary | ICD-10-CM

## 2014-12-08 DIAGNOSIS — I4891 Unspecified atrial fibrillation: Secondary | ICD-10-CM | POA: Diagnosis not present

## 2014-12-08 LAB — POCT INR: INR: 1.8

## 2014-12-08 NOTE — Progress Notes (Signed)
Agree with plan 

## 2014-12-08 NOTE — Progress Notes (Signed)
Pre visit review using our clinic review tool, if applicable. No additional management support is needed unless otherwise documented below in the visit note. 

## 2015-01-05 ENCOUNTER — Ambulatory Visit (INDEPENDENT_AMBULATORY_CARE_PROVIDER_SITE_OTHER): Payer: PPO | Admitting: General Practice

## 2015-01-05 DIAGNOSIS — Z5181 Encounter for therapeutic drug level monitoring: Secondary | ICD-10-CM

## 2015-01-05 DIAGNOSIS — I4891 Unspecified atrial fibrillation: Secondary | ICD-10-CM | POA: Diagnosis not present

## 2015-01-05 LAB — POCT INR: INR: 1.6

## 2015-01-05 NOTE — Progress Notes (Signed)
Agree with plan 

## 2015-01-05 NOTE — Progress Notes (Signed)
Pre visit review using our clinic review tool, if applicable. No additional management support is needed unless otherwise documented below in the visit note. 

## 2015-01-19 ENCOUNTER — Ambulatory Visit (INDEPENDENT_AMBULATORY_CARE_PROVIDER_SITE_OTHER): Payer: PPO | Admitting: General Practice

## 2015-01-19 DIAGNOSIS — Z5181 Encounter for therapeutic drug level monitoring: Secondary | ICD-10-CM

## 2015-01-19 DIAGNOSIS — I4891 Unspecified atrial fibrillation: Secondary | ICD-10-CM | POA: Diagnosis not present

## 2015-01-19 LAB — POCT INR: INR: 1.8

## 2015-01-19 NOTE — Progress Notes (Signed)
Agree with plan 

## 2015-01-19 NOTE — Progress Notes (Signed)
Pre visit review using our clinic review tool, if applicable. No additional management support is needed unless otherwise documented below in the visit note. 

## 2015-01-26 ENCOUNTER — Other Ambulatory Visit: Payer: Self-pay | Admitting: Internal Medicine

## 2015-01-26 ENCOUNTER — Other Ambulatory Visit: Payer: Self-pay | Admitting: General Practice

## 2015-01-26 MED ORDER — WARFARIN SODIUM 2.5 MG PO TABS: ORAL_TABLET | ORAL | Status: DC

## 2015-01-27 ENCOUNTER — Encounter: Payer: Self-pay | Admitting: Internal Medicine

## 2015-01-27 ENCOUNTER — Ambulatory Visit (INDEPENDENT_AMBULATORY_CARE_PROVIDER_SITE_OTHER): Payer: PPO | Admitting: Internal Medicine

## 2015-01-27 VITALS — BP 140/80 | HR 98 | Temp 98.3°F | Resp 20 | Ht 62.0 in | Wt 111.0 lb

## 2015-01-27 DIAGNOSIS — J449 Chronic obstructive pulmonary disease, unspecified: Secondary | ICD-10-CM | POA: Diagnosis not present

## 2015-01-27 DIAGNOSIS — I10 Essential (primary) hypertension: Secondary | ICD-10-CM | POA: Diagnosis not present

## 2015-01-27 DIAGNOSIS — Z7901 Long term (current) use of anticoagulants: Secondary | ICD-10-CM | POA: Diagnosis not present

## 2015-01-27 DIAGNOSIS — I480 Paroxysmal atrial fibrillation: Secondary | ICD-10-CM

## 2015-01-27 NOTE — Progress Notes (Signed)
Pre visit review using our clinic review tool, if applicable. No additional management support is needed unless otherwise documented below in the visit note. Pt here with Oxygen on at 2 L/min via nasal cannula.

## 2015-01-27 NOTE — Progress Notes (Signed)
Subjective:    Patient ID: Stephen [name], male    DOB: [birth year], 74 y.o.   MRN: [id number]  HPI  74 year old patient who has advanced COPD.  More recently has done remarkably well.  Remains on chronic O2 and inhalational medications.  He has a history of atrial fibrillation and has been on chronic Coumadin therapy.  No concerns or complaints today  Past Medical History  Diagnosis Date  . ASTHMA 04/22/2007  . ATRIAL FIBRILLATION, PAROXYSMAL 05/28/2007  . BENIGN PROSTATIC HYPERTROPHY 04/20/2008  . COPD 05/28/2007  . HYPERTENSION 04/22/2007  . HYPOTENSION 10/10/2010  . 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 Complication of anesthesia     difficulty waking up  . On home oxygen therapy     intermittent, uses with increased activity  . Shortness of breath   . Neuromuscular disorder   . Arthritis   . Peroneal neuropathy     L     History   Social History  . Marital Status: Widowed    Spouse Name: N/A  . Number of Children: N/A  . Years of Education: N/A   Occupational History  . Not on file.   Social History Main Topics  . Smoking status: Former Smoker    Quit date: 09/18/1983  . Smokeless tobacco: Never Used  . Alcohol Use: No  . Drug Use: No  . Sexual Activity: Not on file   Other Topics Concern  . Not on file   Social History Narrative    Past Surgical History  Procedure Laterality Date  . Tonsillectomy    . Colonoscopy      No family history on file.  Allergies  Allergen Reactions  . Penicillins Other (See Comments)    REACTION: feels drunk    Current Outpatient Prescriptions on File Prior to Visit  Medication Sig Dispense Refill  . ADVAIR DISKUS 250-50 MCG/DOSE AEPB inhale 1 dose by mouth twice a day 60 each 1  . albuterol (PROVENTIL HFA;VENTOLIN HFA) 108 (90 BASE) MCG/ACT inhaler Inhale 2 puffs into the lungs every 6 (six) hours as needed. For shortness of breath    . AMBULATORY NON FORMULARY MEDICATION Medication Name: oxygen 2 liters     . diltiazem (DILACOR XR) 120 MG 24 hr capsule Take 1 capsule (120 mg total) by mouth daily. 90 capsule 4  . levalbuterol (XOPENEX) 1.25 MG/3ML nebulizer solution inhale contents of 1 vial in nebulizer every 6 hours 360 mL 5  . levothyroxine (SYNTHROID, LEVOTHROID) 75 MCG tablet take 1 tablet EVERY MORNING ON AN EMPTY STOMACH 90 tablet 1  . meclizine (ANTIVERT) 25 MG tablet Take 1 tablet (25 mg total) by mouth 4 (four) times daily as needed for dizziness. 60 tablet 4  . Multiple Vitamin (MULTIVITAMIN) tablet Take 1 tablet by mouth daily.      Marland Kitchen. PACERONE 200 MG tablet take 1 tablet by mouth once daily 90 tablet 1  . polyethylene glycol (MIRALAX / GLYCOLAX) packet Take 17 g by mouth daily as needed. For constipation    . SPIRIVA HANDIHALER 18 MCG inhalation capsule inhale contents of 1 capsule by mouth once daily 30 capsule 6  . tamsulosin (FLOMAX) 0.4 MG CAPS capsule 1 tablet twice daily 180 capsule 4  . warfarin (COUMADIN) 2.5 MG tablet Take as directed by anticoagulation clinic 40 tablet 3   No current facility-administered medications on file prior to visit.    BP 140/80 mmHg  Pulse 98  Temp(Src) 98.3 F (36.8 C) (Oral)  Resp 20  Ht 5\' 2"  (1.575 m)  Wt 111 lb (50.349 kg)  BMI 20.30 kg/m2  SpO2 98%     Review of Systems  Constitutional: Negative for fever, chills, appetite change and fatigue.  HENT: Negative for congestion, dental problem, ear pain, hearing loss, sore throat, tinnitus, trouble swallowing and voice change.   Eyes: Negative for pain, discharge and visual disturbance.  Respiratory: Positive for shortness of breath. Negative for cough, chest tightness, wheezing and stridor.   Cardiovascular: Negative for chest pain, palpitations and leg swelling.  Gastrointestinal: Negative for nausea, vomiting, abdominal pain, diarrhea, constipation, blood in stool and abdominal distention.  Genitourinary: Negative for urgency, hematuria, flank pain, discharge, difficulty urinating  and genital sores.  Musculoskeletal: Negative for myalgias, back pain, joint swelling, arthralgias, gait problem and neck stiffness.  Skin: Negative for rash.  Neurological: Negative for dizziness, syncope, speech difficulty, weakness, numbness and headaches.  Hematological: Negative for adenopathy. Does not bruise/bleed easily.  Psychiatric/Behavioral: Negative for behavioral problems and dysphoric mood. The patient is not nervous/anxious.        Objective:   Physical Exam  Constitutional: He is oriented to person, place, and time. He appears well-developed.  HENT:  Head: Normocephalic.  Right Ear: External ear normal.  Left Ear: External ear normal.  Eyes: Conjunctivae and EOM are normal.  Neck: Normal range of motion.  Cardiovascular: Normal rate and normal heart sounds.   Pulmonary/Chest: Effort normal. No respiratory distress. He has wheezes.  Prolonged expiratory phase Scattered coarse rhonchi and some faint wheezing No increased work of breathing  Abdominal: Bowel sounds are normal.  Musculoskeletal: Normal range of motion. He exhibits no edema or tenderness.  Neurological: He is alert and oriented to person, place, and time.  Psychiatric: He has a normal mood and affect. His behavior is normal.          Assessment & Plan:   COPD compensated.  No change in therapy Atrial fibrillation.  Continue chronic Coumadin anticoagulation  CPX 6 months

## 2015-01-27 NOTE — Patient Instructions (Signed)
Limit your sodium (Salt) intake  Return in 6 months for follow-up  

## 2015-02-02 ENCOUNTER — Other Ambulatory Visit: Payer: Self-pay | Admitting: Internal Medicine

## 2015-02-09 ENCOUNTER — Other Ambulatory Visit: Payer: Self-pay | Admitting: Internal Medicine

## 2015-02-16 ENCOUNTER — Ambulatory Visit (INDEPENDENT_AMBULATORY_CARE_PROVIDER_SITE_OTHER): Payer: PPO | Admitting: *Deleted

## 2015-02-16 DIAGNOSIS — I480 Paroxysmal atrial fibrillation: Secondary | ICD-10-CM | POA: Diagnosis not present

## 2015-02-16 DIAGNOSIS — I4891 Unspecified atrial fibrillation: Secondary | ICD-10-CM

## 2015-02-16 DIAGNOSIS — Z5181 Encounter for therapeutic drug level monitoring: Secondary | ICD-10-CM | POA: Diagnosis not present

## 2015-02-16 LAB — POCT INR: INR: 1.9

## 2015-02-16 NOTE — Progress Notes (Signed)
I have reviewed and agree with the plan. 

## 2015-02-16 NOTE — Progress Notes (Signed)
Pre visit review using our clinic review tool, if applicable. No additional management support is needed unless otherwise documented below in the visit note. 

## 2015-03-09 ENCOUNTER — Other Ambulatory Visit: Payer: Self-pay | Admitting: Internal Medicine

## 2015-03-16 ENCOUNTER — Ambulatory Visit (INDEPENDENT_AMBULATORY_CARE_PROVIDER_SITE_OTHER): Payer: PPO | Admitting: General Practice

## 2015-03-16 DIAGNOSIS — Z5181 Encounter for therapeutic drug level monitoring: Secondary | ICD-10-CM | POA: Diagnosis not present

## 2015-03-16 DIAGNOSIS — I4891 Unspecified atrial fibrillation: Secondary | ICD-10-CM | POA: Diagnosis not present

## 2015-03-16 LAB — POCT INR: INR: 2.8

## 2015-03-16 NOTE — Progress Notes (Signed)
Pre visit review using our clinic review tool, if applicable. No additional management support is needed unless otherwise documented below in the visit note. 

## 2015-03-21 NOTE — Progress Notes (Signed)
I have reviewed and agree with the plan. 

## 2015-04-13 ENCOUNTER — Ambulatory Visit (INDEPENDENT_AMBULATORY_CARE_PROVIDER_SITE_OTHER): Payer: PPO | Admitting: General Practice

## 2015-04-13 DIAGNOSIS — I4891 Unspecified atrial fibrillation: Secondary | ICD-10-CM | POA: Diagnosis not present

## 2015-04-13 DIAGNOSIS — Z5181 Encounter for therapeutic drug level monitoring: Secondary | ICD-10-CM | POA: Diagnosis not present

## 2015-04-13 LAB — POCT INR: INR: 2.7

## 2015-04-13 NOTE — Progress Notes (Signed)
Pre visit review using our clinic review tool, if applicable. No additional management support is needed unless otherwise documented below in the visit note. 

## 2015-04-13 NOTE — Progress Notes (Signed)
I have reviewed and agree with the plan. 

## 2015-05-11 ENCOUNTER — Ambulatory Visit (INDEPENDENT_AMBULATORY_CARE_PROVIDER_SITE_OTHER): Payer: PPO | Admitting: General Practice

## 2015-05-11 DIAGNOSIS — I4891 Unspecified atrial fibrillation: Secondary | ICD-10-CM | POA: Diagnosis not present

## 2015-05-11 DIAGNOSIS — Z5181 Encounter for therapeutic drug level monitoring: Secondary | ICD-10-CM | POA: Diagnosis not present

## 2015-05-11 LAB — POCT INR: INR: 2.4

## 2015-05-11 NOTE — Progress Notes (Signed)
I have reviewed and agree with the plan. 

## 2015-05-11 NOTE — Progress Notes (Signed)
Pre visit review using our clinic review tool, if applicable. No additional management support is needed unless otherwise documented below in the visit note. 

## 2015-05-31 ENCOUNTER — Telehealth: Payer: Self-pay | Admitting: Internal Medicine

## 2015-05-31 ENCOUNTER — Other Ambulatory Visit: Payer: Self-pay | Admitting: General Practice

## 2015-05-31 MED ORDER — WARFARIN SODIUM 2.5 MG PO TABS: ORAL_TABLET | ORAL | Status: DC

## 2015-05-31 NOTE — Telephone Encounter (Signed)
Pt request refill of the following: warfarin (COUMADIN) 2.5 MG tablet   Phamacy: [address]

## 2015-06-09 ENCOUNTER — Other Ambulatory Visit: Payer: Self-pay | Admitting: Internal Medicine

## 2015-06-22 ENCOUNTER — Ambulatory Visit (INDEPENDENT_AMBULATORY_CARE_PROVIDER_SITE_OTHER): Payer: PPO

## 2015-06-22 ENCOUNTER — Ambulatory Visit: Payer: PPO

## 2015-06-22 DIAGNOSIS — Z23 Encounter for immunization: Secondary | ICD-10-CM

## 2015-07-05 ENCOUNTER — Ambulatory Visit (INDEPENDENT_AMBULATORY_CARE_PROVIDER_SITE_OTHER): Payer: PPO | Admitting: General Practice

## 2015-07-05 DIAGNOSIS — Z5181 Encounter for therapeutic drug level monitoring: Secondary | ICD-10-CM | POA: Diagnosis not present

## 2015-07-05 LAB — POCT INR: INR: 2.2

## 2015-07-05 NOTE — Progress Notes (Signed)
Pre visit review using our clinic review tool, if applicable. No additional management support is needed unless otherwise documented below in the visit note. 

## 2015-07-05 NOTE — Progress Notes (Signed)
I have reviewed and agree with the plan. 

## 2015-07-19 ENCOUNTER — Encounter: Payer: Self-pay | Admitting: Internal Medicine

## 2015-07-19 ENCOUNTER — Ambulatory Visit (INDEPENDENT_AMBULATORY_CARE_PROVIDER_SITE_OTHER): Payer: PPO | Admitting: Internal Medicine

## 2015-07-19 VITALS — BP 146/78 | HR 95 | Temp 98.4°F | Resp 24 | Ht 62.0 in | Wt 125.0 lb

## 2015-07-19 DIAGNOSIS — J449 Chronic obstructive pulmonary disease, unspecified: Secondary | ICD-10-CM | POA: Diagnosis not present

## 2015-07-19 DIAGNOSIS — I480 Paroxysmal atrial fibrillation: Secondary | ICD-10-CM | POA: Diagnosis not present

## 2015-07-19 DIAGNOSIS — I10 Essential (primary) hypertension: Secondary | ICD-10-CM

## 2015-07-19 DIAGNOSIS — Z5181 Encounter for therapeutic drug level monitoring: Secondary | ICD-10-CM | POA: Diagnosis not present

## 2015-07-19 LAB — COMPREHENSIVE METABOLIC PANEL
ALBUMIN: 3.9 g/dL (ref 3.5–5.2)
ALT: 17 U/L (ref 0–53)
AST: 20 U/L (ref 0–37)
Alkaline Phosphatase: 93 U/L (ref 39–117)
BUN: 18 mg/dL (ref 6–23)
CALCIUM: 8.7 mg/dL (ref 8.4–10.5)
CHLORIDE: 107 meq/L (ref 96–112)
CO2: 25 meq/L (ref 19–32)
Creatinine, Ser: 1.6 mg/dL — ABNORMAL HIGH (ref 0.40–1.50)
GFR: 44.98 mL/min — AB (ref >60.00)
Glucose, Bld: 96 mg/dL (ref 70–99)
POTASSIUM: 4.9 meq/L (ref 3.5–5.1)
Sodium: 141 mEq/L (ref 135–145)
Total Bilirubin: 0.7 mg/dL (ref 0.2–1.2)
Total Protein: 6 g/dL (ref 6.0–8.3)

## 2015-07-19 LAB — TSH: TSH: 0.2 u[IU]/mL — ABNORMAL LOW (ref 0.35–4.50)

## 2015-07-19 LAB — CBC WITH DIFFERENTIAL/PLATELET
BASOS PCT: 0.3 % (ref 0.0–3.0)
Basophils Absolute: 0 10*3/uL (ref 0.0–0.1)
EOS ABS: 0.3 10*3/uL (ref 0.0–0.7)
Eosinophils Relative: 2.9 % (ref 0.0–5.0)
HEMATOCRIT: 43.2 % (ref 39.0–52.0)
HEMOGLOBIN: 13.8 g/dL (ref 13.0–17.0)
LYMPHS PCT: 10 % — AB (ref 12.0–46.0)
Lymphs Abs: 1.1 10*3/uL (ref 0.7–4.0)
MCHC: 32.1 g/dL (ref 30.0–36.0)
MCV: 94 fl (ref 78.0–100.0)
MONOS PCT: 7.9 % (ref 3.0–12.0)
Monocytes Absolute: 0.9 10*3/uL (ref 0.1–1.0)
NEUTROS ABS: 8.5 10*3/uL — AB (ref 1.4–7.7)
Neutrophils Relative %: 78.9 % — ABNORMAL HIGH (ref 43.0–77.0)
PLATELETS: 277 10*3/uL (ref 150.0–400.0)
RBC: 4.59 Mil/uL (ref 4.22–5.81)
RDW: 16.4 % — AB (ref 11.5–15.5)
WBC: 10.8 10*3/uL — AB (ref 4.0–10.5)

## 2015-07-19 NOTE — Patient Instructions (Signed)
Limit your sodium (Salt) intake  Please check your blood pressure on a regular basis.  If it is consistently greater than 150/90, please make an office appointment.  Return in 6 months for follow-up   

## 2015-07-19 NOTE — Progress Notes (Signed)
Subjective:    Patient ID: Stephen [name], male    DOB: [birth year], 74 y.o.   MRN: [id number]  HPI  74 year old patient who has essential hypertension, paroxysmal atrial fibrillation and a history of severe oxygen dependent COPD.  He has done remarkably well over the past 6 months.  Remains on Coumadin anticoagulation.  INRs have been nicely controlled.  No recent lab.  Past Medical History  Diagnosis Date  . ASTHMA 04/22/2007  . ATRIAL FIBRILLATION, PAROXYSMAL 05/28/2007  . BENIGN PROSTATIC HYPERTROPHY 04/20/2008  . COPD 05/28/2007  . HYPERTENSION 04/22/2007  . HYPOTENSION 10/10/2010  . 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 Complication of anesthesia     difficulty waking up  . On home oxygen therapy     intermittent, uses with increased activity  . Shortness of breath   . Neuromuscular disorder (HCC)   . Arthritis   . Peroneal neuropathy     L     Social History   Social History  . Marital Status: Widowed    Spouse Name: N/A  . Number of Children: N/A  . Years of Education: N/A   Occupational History  . Not on file.   Social History Main Topics  . Smoking status: Former Smoker    Quit date: 09/18/1983  . Smokeless tobacco: Never Used  . Alcohol Use: No  . Drug Use: No  . Sexual Activity: Not on file   Other Topics Concern  . Not on file   Social History Narrative    Past Surgical History  Procedure Laterality Date  . Tonsillectomy    . Colonoscopy      No family history on file.  Allergies  Allergen Reactions  . Penicillins Other (See Comments)    REACTION: feels drunk    Current Outpatient Prescriptions on File Prior to Visit  Medication Sig Dispense Refill  . ADVAIR DISKUS 250-50 MCG/DOSE AEPB inhale 1 dose by mouth twice a day 60 each 5  . albuterol (PROVENTIL HFA;VENTOLIN HFA) 108 (90 BASE) MCG/ACT inhaler Inhale 2 puffs into the lungs every 6 (six) hours as needed. For shortness of breath    . AMBULATORY NON FORMULARY MEDICATION  Medication Name: oxygen 2 liters    . diltiazem (DILACOR XR) 120 MG 24 hr capsule Take 1 capsule (120 mg total) by mouth daily. 90 capsule 4  . levalbuterol (XOPENEX) 1.25 MG/3ML nebulizer solution inhale contents of 1 vial in nebulizer every 6 hours 360 mL 5  . levothyroxine (SYNTHROID, LEVOTHROID) 75 MCG tablet take 1 tablet by mouth every morning ON AN EMPTY STOMACH 90 tablet 1  . meclizine (ANTIVERT) 25 MG tablet Take 1 tablet (25 mg total) by mouth 4 (four) times daily as needed for dizziness. 60 tablet 4  . Multiple Vitamin (MULTIVITAMIN) tablet Take 1 tablet by mouth daily.      Marland Kitchen PACERONE 200 MG tablet take 1 tablet by mouth once daily 90 tablet 1  . polyethylene glycol (MIRALAX / GLYCOLAX) packet Take 17 g by mouth daily as needed. For constipation    . SPIRIVA HANDIHALER 18 MCG inhalation capsule inhale contents of 1 capsule by mouth once daily 30 capsule 6  . tamsulosin (FLOMAX) 0.4 MG CAPS capsule take 1 capsule by mouth twice a day 180 capsule 3  . warfarin (COUMADIN) 2.5 MG tablet Take as directed by anticoagulation clinic 40 tablet 3   No current facility-administered medications on file prior to visit.    BP 146/78 mmHg  Pulse  95  Temp(Src) 98.4 F (36.9 C) (Oral)  Resp 24  Ht 5\' 2"  (1.575 m)  Wt 125 lb (56.7 kg)  BMI 22.86 kg/m2  SpO2 95%      Review of Systems  Constitutional: Negative for fever, chills, activity change, appetite change and fatigue.  HENT: Negative for congestion, dental problem, ear pain, hearing loss, mouth sores, rhinorrhea, sinus pressure, sneezing, sore throat, tinnitus, trouble swallowing and voice change.   Eyes: Negative for photophobia, pain, discharge, redness and visual disturbance.  Respiratory: Positive for cough and shortness of breath. Negative for apnea, choking, chest tightness, wheezing and stridor.   Cardiovascular: Negative for chest pain, palpitations and leg swelling.  Gastrointestinal: Negative for nausea, vomiting,  abdominal pain, diarrhea, constipation, blood in stool, abdominal distention, anal bleeding and rectal pain.  Genitourinary: Negative for dysuria, urgency, frequency, hematuria, flank pain, decreased urine volume, discharge, penile swelling, scrotal swelling, difficulty urinating, genital sores and testicular pain.  Musculoskeletal: Negative for myalgias, back pain, joint swelling, arthralgias, gait problem, neck pain and neck stiffness.  Skin: Negative for color change, rash and wound.  Neurological: Negative for dizziness, tremors, seizures, syncope, facial asymmetry, speech difficulty, weakness, light-headedness, numbness and headaches.  Hematological: Negative for adenopathy. Does not bruise/bleed easily.  Psychiatric/Behavioral: Negative for suicidal ideas, hallucinations, behavioral problems, confusion, sleep disturbance, self-injury, dysphoric mood, decreased concentration and agitation. The patient is not nervous/anxious.        Objective:   Physical Exam  Constitutional: He is oriented to person, place, and time. He appears well-developed.  HENT:  Head: Normocephalic.  Right Ear: External ear normal.  Left Ear: External ear normal.  Nasal cannula O2 in place  Eyes: Conjunctivae and EOM are normal.  Neck: Normal range of motion.  Cardiovascular: Normal rate and normal heart sounds.   Pulmonary/Chest: Effort normal. No respiratory distress. He has no wheezes. He has no rales.  Diminished breath sounds but clear  O2 saturation 95%  Abdominal: Bowel sounds are normal.  Musculoskeletal: Normal range of motion. He exhibits no edema or tenderness.  Neurological: He is alert and oriented to person, place, and time.  Psychiatric: He has a normal mood and affect. His behavior is normal.          Assessment & Plan:   Severe COPD Essential hypertension Paroxysmal atrial fibrillation.  Continue chronic anticoagulation  Check updated lab  Recheck 6 months

## 2015-07-19 NOTE — Progress Notes (Signed)
Pre visit review using our clinic review tool, if applicable. No additional management support is needed unless otherwise documented below in the visit note. 

## 2015-07-29 ENCOUNTER — Ambulatory Visit: Payer: PPO | Admitting: Internal Medicine

## 2015-08-08 ENCOUNTER — Other Ambulatory Visit: Payer: Self-pay | Admitting: Internal Medicine

## 2015-08-16 ENCOUNTER — Ambulatory Visit (INDEPENDENT_AMBULATORY_CARE_PROVIDER_SITE_OTHER): Payer: PPO | Admitting: General Practice

## 2015-08-16 DIAGNOSIS — I4891 Unspecified atrial fibrillation: Secondary | ICD-10-CM

## 2015-08-16 DIAGNOSIS — Z5181 Encounter for therapeutic drug level monitoring: Secondary | ICD-10-CM

## 2015-08-16 LAB — POCT INR: INR: 2.4

## 2015-08-16 NOTE — Progress Notes (Signed)
I have reviewed and agree with the plan. 

## 2015-08-16 NOTE — Progress Notes (Signed)
Pre visit review using our clinic review tool, if applicable. No additional management support is needed unless otherwise documented below in the visit note. 

## 2015-08-25 ENCOUNTER — Other Ambulatory Visit: Payer: Self-pay | Admitting: Internal Medicine

## 2015-09-09 ENCOUNTER — Emergency Department (INDEPENDENT_AMBULATORY_CARE_PROVIDER_SITE_OTHER)
Admission: EM | Admit: 2015-09-09 | Discharge: 2015-09-09 | Disposition: A | Discharge status: Home / Self Care | Payer: PPO | Source: Home / Self Care

## 2015-09-09 ENCOUNTER — Encounter (HOSPITAL_COMMUNITY): Payer: Self-pay | Admitting: Emergency Medicine

## 2015-09-09 DIAGNOSIS — J209 Acute bronchitis, unspecified: Secondary | ICD-10-CM | POA: Diagnosis not present

## 2015-09-09 DIAGNOSIS — R05 Cough: Secondary | ICD-10-CM

## 2015-09-09 DIAGNOSIS — R059 Cough, unspecified: Secondary | ICD-10-CM

## 2015-09-09 MED ORDER — DOXYCYCLINE HYCLATE 100 MG PO CAPS: 100.0000 mg | ORAL_CAPSULE | Freq: Two times a day (BID) | ORAL | Status: AC

## 2015-09-09 MED ORDER — PREDNISONE 10 MG PO TABS: 20.0000 mg | ORAL_TABLET | Freq: Two times a day (BID) | ORAL | Status: DC

## 2015-09-09 NOTE — ED Provider Notes (Signed)
CSN: [id number]     Arrival date & time 09/09/15  1328 History   None    Chief Complaint  Patient presents with  . URI   (Consider location/radiation/quality/duration/timing/severity/associated sxs/prior Treatment) HPI Pt presents with 2-3 day history of dry productive cough. Sputum is mostly clear but does have occasional color but no fever. Using musinex at home for nasal congestion.  Past Medical History  Diagnosis Date  . ASTHMA 04/22/2007  . ATRIAL FIBRILLATION, PAROXYSMAL 05/28/2007  . BENIGN PROSTATIC HYPERTROPHY 04/20/2008  . COPD 05/28/2007  . HYPERTENSION 04/22/2007  . HYPOTENSION 10/10/2010  . 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 Complication of anesthesia     difficulty waking up  . On home oxygen therapy     intermittent, uses with increased activity  . Shortness of breath   . Neuromuscular disorder (HCC)   . Arthritis   . Peroneal neuropathy     L    Past Surgical History  Procedure Laterality Date  . Tonsillectomy    . Colonoscopy     No family history on file. Social History  Substance Use Topics  . Smoking status: Former Smoker    Quit date: 09/18/1983  . Smokeless tobacco: Never Used  . Alcohol Use: No    Review of Systems ROS +'ve cough, sputum production  Denies: HEADACHE, NAUSEA, ABDOMINAL PAIN, CHEST PAIN, CONGESTION, DYSURIA, SHORTNESS OF BREATH  Allergies  Penicillins  Home Medications   Prior to Admission medications   Medication Sig Start Date End Date Taking? Authorizing Provider  ADVAIR DISKUS 250-50 MCG/DOSE AEPB inhale 1 dose by mouth twice a day 08/08/15  Yes Gordy Savers, MD  albuterol (PROVENTIL HFA;VENTOLIN HFA) 108 (90 BASE) MCG/ACT inhaler Inhale 2 puffs into the lungs every 6 (six) hours as needed. For shortness of breath 11/17/10  Yes Gordy Savers, MD  AMBULATORY NON FORMULARY MEDICATION Medication Name: oxygen 2 liters   Yes Historical Provider, MD  diltiazem (DILACOR XR) 120 MG 24 hr capsule Take 1  capsule (120 mg total) by mouth daily. 07/28/14  Yes Gordy Savers, MD  levalbuterol Pauline Aus) 1.25 MG/3ML nebulizer solution inhale contents of 1 vial in nebulizer every 6 hours 11/12/14  Yes Gordy Savers, MD  levothyroxine (SYNTHROID, LEVOTHROID) 75 MCG tablet take 1 tablet by mouth every morning ON AN EMPTY STOMACH 06/09/15  Yes Gordy Savers, MD  meclizine (ANTIVERT) 25 MG tablet Take 1 tablet (25 mg total) by mouth 4 (four) times daily as needed for dizziness. 10/19/14  Yes Gordy Savers, MD  Multiple Vitamin (MULTIVITAMIN) tablet Take 1 tablet by mouth daily.     Yes Historical Provider, MD  PACERONE 200 MG tablet take 1 tablet by mouth once daily 08/26/15  Yes Gordy Savers, MD  [facility] HANDIHALER 18 MCG inhalation capsule inhale contents of 1 capsule by mouth once daily 02/09/15  Yes Gordy Savers, MD  tamsulosin [facility]) 0.4 MG CAPS capsule take 1 capsule by mouth twice a day 03/09/15  Yes Gordy Savers, MD  warfarin (COUMADIN) 2.5 MG tablet Take as directed by anticoagulation clinic 05/31/15  Yes Gordy Savers, MD  polyethylene glycol [facility] Ethelene Hal) packet Take 17 g by mouth daily as needed. For constipation    Historical Provider, MD   Meds Ordered and Administered this Visit  Medications - No data to display  BP 162/84 mmHg  Pulse 90  Temp(Src) 98.1 F (36.7 C) (Oral)  Resp 24  SpO2 87% No data  found.   Physical Exam  Constitutional: He is oriented to person, place, and time. He appears well-developed and well-nourished.  HENT:  Head: Normocephalic and atraumatic.  Eyes: Conjunctivae are normal.  Neck: Neck supple.  Cardiovascular: Normal rate.   Pulmonary/Chest: Effort normal and breath sounds normal.  Rhonchi and adventitious sounds in the upper airway. Minimal wheezing no crackles.  Abdominal: Soft.  Musculoskeletal: Normal range of motion.  Neurological: He is alert and oriented to person, place, and time.  Skin: Skin  is warm and dry.  Psychiatric: He has a normal mood and affect. His behavior is normal. Judgment and thought content normal.    ED Course  Procedures (including critical care time)  Labs Review Labs Reviewed - No data to display  Imaging Review No results found.   Visual Acuity Review  Right Eye Distance:   Left Eye Distance:   Bilateral Distance:    Right Eye Near:   Left Eye Near:    Bilateral Near:         MDM   1. Cough     Discussed with Dr. Amador CunasKwiatkowski and he has suggested starting patient on doxycycline twice a day. Also prednisone 20 mg twice a day for 5-7 days. Patient is advised to follow-up with his primary care provider next week. Instructions of care provided discharged home in stable condition.    Tharon AquasFrank C Patrick, PA 09/09/15 1529

## 2015-09-09 NOTE — Discharge Instructions (Signed)
Cough, Adult °A cough helps to clear your throat and lungs. A cough may last only 2-3 weeks (acute), or it may last longer than 8 weeks (chronic). Many different things can cause a cough. A cough may be a sign of an illness or another medical condition. °HOME CARE °· Pay attention to any changes in your cough. °· Take medicines only as told by your doctor. °· If you were prescribed an antibiotic medicine, take it as told by your doctor. Do not stop taking it even if you start to feel better. °· Talk with your doctor before you try using a cough medicine. °· Drink enough fluid to keep your pee (urine) clear or pale yellow. °· If the air is dry, use a cold steam vaporizer or humidifier in your home. °· Stay away from things that make you cough at work or at home. °· If your cough is worse at night, try using extra pillows to raise your head up higher while you sleep. °· Do not smoke, and try not to be around smoke. If you need help quitting, ask your doctor. °· Do not have caffeine. °· Do not drink alcohol. °· Rest as needed. °GET HELP IF: °· You have new problems (symptoms). °· You cough up yellow fluid (pus). °· Your cough does not get better after 2-3 weeks, or your cough gets worse. °· Medicine does not help your cough and you are not sleeping well. °· You have pain that gets worse or pain that is not helped with medicine. °· You have a fever. °· You are losing weight and you do not know why. °· You have night sweats. °GET HELP RIGHT AWAY IF: °· You cough up blood. °· You have trouble breathing. °· Your heartbeat is very fast. °  °This information is not intended to replace advice given to you by your health care provider. Make sure you discuss any questions you have with your health care provider. °  °Document Released: 05/17/2011 Document Revised: 05/25/2015 Document Reviewed: 11/10/2014 °Elsevier Interactive Patient Education ©2016 [employer] °Bronchospasm, Adult °A bronchospasm is when the tubes that carry  air in and out of your lungs (airways) spasm or tighten. During a bronchospasm it is hard to breathe. This is because the airways get smaller. A bronchospasm can be triggered by: °· Allergies. These may be to animals, pollen, food, or mold. °· Infection. This is a common cause of bronchospasm. °· Exercise. °· Irritants. These include pollution, cigarette smoke, strong odors, aerosol sprays, and paint fumes. °· Weather changes. °· Stress. °· Being emotional. °HOME CARE  °· Always have a plan for getting help. Know when to call your doctor and local emergency services (911 in the [place] Know where you can get emergency care. °· Only take medicines as told by your doctor. °· If you were prescribed an inhaler or nebulizer machine, ask your doctor how to use it correctly. Always use a spacer with your inhaler if you were given one. °· Stay calm during an attack. Try to relax and breathe more slowly. °· Control your home environment: °¨ Change your heating and air conditioning filter at least once a month. °¨ Limit your use of fireplaces and wood stoves. °¨ Do not  smoke. Do not  allow smoking in your home. °¨ Avoid perfumes and fragrances. °¨ Get rid of pests (such as roaches and mice) and their droppings. °¨ Throw away plants if you see mold on them. °¨ Keep your house clean and dust   free. °¨ Replace carpet with wood, tile, or vinyl flooring. Carpet can trap dander and dust. °¨ Use allergy-proof pillows, mattress covers, and box spring covers. °¨ Wash bed sheets and blankets every week in hot water. Dry them in a dryer. °¨ Use blankets that are made of polyester or cotton. °¨ Wash hands frequently. °GET HELP IF: °· You have muscle aches. °· You have chest pain. °· The thick spit you spit or cough up (sputum) changes from clear or white to yellow, green, gray, or bloody. °· The thick spit you spit or cough up gets thicker. °· There are problems that may be related to the medicine you are given such as: °¨ A  rash. °¨ Itching. °¨ Swelling. °¨ Trouble breathing. °GET HELP RIGHT AWAY IF: °· You feel you cannot breathe or catch your breath. °· You cannot stop coughing. °· Your treatment is not helping you breathe better. °· You have very bad chest pain. °MAKE SURE YOU:  °· Understand these instructions. °· Will watch your condition. °· Will get help right away if you are not doing well or get worse. °  °This information is not intended to replace advice given to you by your health care provider. Make sure you discuss any questions you have with your health care provider. °  °Document Released: 07/01/2009 Document Revised: 09/24/2014 Document Reviewed: 02/24/2013 °Elsevier Interactive Patient Education ©2016 Elsevier Inc. ° °

## 2015-09-09 NOTE — ED Notes (Signed)
C/o cold sx onset 6 days Sx include SOB, wheezing, runny nose, congestion, prod cough.... Hx of COPD O2 today = 87 w/o Oxygen Per pt... He is on 2 litters of O2... Placed pt on 2 litters A&O x4... No acute distress.

## 2015-09-27 ENCOUNTER — Telehealth: Payer: Self-pay | Admitting: *Deleted

## 2015-09-27 ENCOUNTER — Other Ambulatory Visit: Payer: Self-pay | Admitting: General Practice

## 2015-09-27 ENCOUNTER — Ambulatory Visit (INDEPENDENT_AMBULATORY_CARE_PROVIDER_SITE_OTHER): Payer: PPO | Admitting: General Practice

## 2015-09-27 ENCOUNTER — Other Ambulatory Visit (INDEPENDENT_AMBULATORY_CARE_PROVIDER_SITE_OTHER): Payer: PPO

## 2015-09-27 DIAGNOSIS — Z5181 Encounter for therapeutic drug level monitoring: Secondary | ICD-10-CM | POA: Diagnosis not present

## 2015-09-27 DIAGNOSIS — I4891 Unspecified atrial fibrillation: Secondary | ICD-10-CM

## 2015-09-27 LAB — PROTIME-INR
INR: 8.1 ratio — AB (ref 0.8–1.0)
Prothrombin Time: 90.2 s (ref 9.6–13.1)

## 2015-09-27 LAB — POCT INR: INR: 8

## 2015-09-27 MED ORDER — WARFARIN SODIUM 2.5 MG PO TABS: ORAL_TABLET | ORAL | Status: AC

## 2015-09-27 NOTE — Progress Notes (Signed)
Pre visit review using our clinic review tool, if applicable. No additional management support is needed unless otherwise documented below in the visit note. Sent patient to the lab for stat pt/inr.  Lab results came back at 8.1.  Patient instructed to stop coumadin and to go directly to ER if any unusual bleeding were to occur.  Patient is to be re-checked on Friday 1/13.  Also instructed patient to be extremely careful when ambulating and to take extra care when walking outside.  Patient verbalized understanding.

## 2015-09-27 NOTE — Progress Notes (Signed)
I have reviewed and agree with the plan. 

## 2015-09-27 NOTE — Telephone Encounter (Signed)
CRITICAL VALUE STICKER  CRITICAL VALUE: INR 8.1 /  PT 90.2  RECEIVER (INITALS): Chilton SiShay Johnson / Rolly PancakeMichelle Kamylle Axelson, RN  DATE & TIME NOTIFIED: 2:45pm 09/27/15  MD NOTIFIED: Dr. Amador CunasKwiatkowski   TIME OF NOTIFICATION: 2:46pm 09/27/15  RESPONSE: Bailey Mechindy Boyd, RN made self aware of INR and stated she will begin the proper interventions with critical INR and to make PCP, Dr. Kirtland BouchardK, aware. Arline AspCindy confirms they do not call in Vitamin K unless INR is 10 or above, patient has been on Doxycyclin and Prednisone for past 10 days so Arline AspCindy is suspecting that is cause of spike. During assessment, Arline AspCindy confirms no active bleeding, no bruising, patient has been educated on minimizing fall risks and bleeding precautions.Patient strongly educated if bleeding, bruising, or change in mentation, to go to ED and patient verbalized understanding. Patient's INR does come in range most of the time. Plan of care includes holding Coumadin through Friday and will recheck INR on Friday. Dr. Kirtland BouchardK made aware.

## 2015-09-30 ENCOUNTER — Ambulatory Visit (INDEPENDENT_AMBULATORY_CARE_PROVIDER_SITE_OTHER): Payer: PPO | Admitting: General Practice

## 2015-09-30 DIAGNOSIS — I4891 Unspecified atrial fibrillation: Secondary | ICD-10-CM

## 2015-09-30 DIAGNOSIS — Z5181 Encounter for therapeutic drug level monitoring: Secondary | ICD-10-CM | POA: Diagnosis not present

## 2015-09-30 LAB — POCT INR: INR: 2.3

## 2015-09-30 NOTE — Progress Notes (Signed)
Pre visit review using our clinic review tool, if applicable. No additional management support is needed unless otherwise documented below in the visit note.  Re-checked patient's INR due to 8.1 on 09/29/15.  Patient is in range today.  Encouraged patient to notify coumadin clinic when starting antibiotics in the future.  Patient verbalized understanding.

## 2015-09-30 NOTE — Progress Notes (Signed)
I have reviewed and agree with the plan. 

## 2015-10-24 ENCOUNTER — Telehealth: Payer: Self-pay | Admitting: Internal Medicine

## 2015-10-24 MED ORDER — DILTIAZEM HCL ER 120 MG PO CP24: 120.0000 mg | ORAL_CAPSULE | Freq: Every day | ORAL | Status: AC

## 2015-10-24 NOTE — Telephone Encounter (Signed)
Rx sent to pharmacy   

## 2015-10-24 NOTE — Telephone Encounter (Signed)
Needs a refill on his diltiazem xr please

## 2015-10-28 ENCOUNTER — Ambulatory Visit (INDEPENDENT_AMBULATORY_CARE_PROVIDER_SITE_OTHER): Payer: PPO | Admitting: General Practice

## 2015-10-28 DIAGNOSIS — Z5181 Encounter for therapeutic drug level monitoring: Secondary | ICD-10-CM

## 2015-10-28 DIAGNOSIS — I4891 Unspecified atrial fibrillation: Secondary | ICD-10-CM

## 2015-10-28 LAB — POCT INR: INR: 3.2

## 2015-10-28 NOTE — Progress Notes (Signed)
Pre visit review using our clinic review tool, if applicable. No additional management support is needed unless otherwise documented below in the visit note. 

## 2015-10-28 NOTE — Progress Notes (Signed)
I have reviewed and agree with the plan. 

## 2015-11-25 ENCOUNTER — Ambulatory Visit (INDEPENDENT_AMBULATORY_CARE_PROVIDER_SITE_OTHER): Payer: PPO | Admitting: General Practice

## 2015-11-25 DIAGNOSIS — Z5181 Encounter for therapeutic drug level monitoring: Secondary | ICD-10-CM | POA: Diagnosis not present

## 2015-11-25 DIAGNOSIS — I4891 Unspecified atrial fibrillation: Secondary | ICD-10-CM

## 2015-11-25 LAB — POCT INR: INR: 4.1

## 2015-11-25 NOTE — Progress Notes (Signed)
Pre visit review using our clinic review tool, if applicable. No additional management support is needed unless otherwise documented below in the visit note. 

## 2015-11-25 NOTE — Progress Notes (Signed)
I have reviewed and agree with the plan. 

## 2015-12-03 ENCOUNTER — Inpatient Hospital Stay (HOSPITAL_COMMUNITY): Payer: PPO

## 2015-12-03 ENCOUNTER — Emergency Department (HOSPITAL_COMMUNITY): Payer: PPO

## 2015-12-03 ENCOUNTER — Inpatient Hospital Stay (HOSPITAL_COMMUNITY)
Admission: EM | Admit: 2015-12-03 | Discharge: 2016-01-16 | DRG: 480 | Disposition: E | Payer: PPO | Attending: Internal Medicine | Admitting: Internal Medicine

## 2015-12-03 ENCOUNTER — Encounter (HOSPITAL_COMMUNITY): Payer: Self-pay | Admitting: Emergency Medicine

## 2015-12-03 DIAGNOSIS — A419 Sepsis, unspecified organism: Secondary | ICD-10-CM | POA: Diagnosis not present

## 2015-12-03 DIAGNOSIS — K420 Umbilical hernia with obstruction, without gangrene: Secondary | ICD-10-CM | POA: Diagnosis present

## 2015-12-03 DIAGNOSIS — N400 Enlarged prostate without lower urinary tract symptoms: Secondary | ICD-10-CM | POA: Diagnosis present

## 2015-12-03 DIAGNOSIS — J156 Pneumonia due to other aerobic Gram-negative bacteria: Secondary | ICD-10-CM | POA: Diagnosis not present

## 2015-12-03 DIAGNOSIS — I480 Paroxysmal atrial fibrillation: Secondary | ICD-10-CM | POA: Diagnosis present

## 2015-12-03 DIAGNOSIS — K56609 Unspecified intestinal obstruction, unspecified as to partial versus complete obstruction: Secondary | ICD-10-CM

## 2015-12-03 DIAGNOSIS — I130 Hypertensive heart and chronic kidney disease with heart failure and stage 1 through stage 4 chronic kidney disease, or unspecified chronic kidney disease: Secondary | ICD-10-CM | POA: Diagnosis present

## 2015-12-03 DIAGNOSIS — E872 Acidosis: Secondary | ICD-10-CM | POA: Diagnosis not present

## 2015-12-03 DIAGNOSIS — E039 Hypothyroidism, unspecified: Secondary | ICD-10-CM | POA: Diagnosis present

## 2015-12-03 DIAGNOSIS — R601 Generalized edema: Secondary | ICD-10-CM | POA: Diagnosis not present

## 2015-12-03 DIAGNOSIS — R64 Cachexia: Secondary | ICD-10-CM | POA: Diagnosis present

## 2015-12-03 DIAGNOSIS — T148 Other injury of unspecified body region: Secondary | ICD-10-CM | POA: Diagnosis not present

## 2015-12-03 DIAGNOSIS — D62 Acute posthemorrhagic anemia: Secondary | ICD-10-CM | POA: Diagnosis not present

## 2015-12-03 DIAGNOSIS — Z79899 Other long term (current) drug therapy: Secondary | ICD-10-CM

## 2015-12-03 DIAGNOSIS — R140 Abdominal distension (gaseous): Secondary | ICD-10-CM

## 2015-12-03 DIAGNOSIS — J189 Pneumonia, unspecified organism: Secondary | ICD-10-CM | POA: Diagnosis not present

## 2015-12-03 DIAGNOSIS — Y95 Nosocomial condition: Secondary | ICD-10-CM | POA: Diagnosis not present

## 2015-12-03 DIAGNOSIS — J440 Chronic obstructive pulmonary disease with acute lower respiratory infection: Secondary | ICD-10-CM | POA: Diagnosis present

## 2015-12-03 DIAGNOSIS — J9601 Acute respiratory failure with hypoxia: Secondary | ICD-10-CM | POA: Diagnosis not present

## 2015-12-03 DIAGNOSIS — M8580 Other specified disorders of bone density and structure, unspecified site: Secondary | ICD-10-CM | POA: Diagnosis present

## 2015-12-03 DIAGNOSIS — Z7952 Long term (current) use of systemic steroids: Secondary | ICD-10-CM | POA: Diagnosis not present

## 2015-12-03 DIAGNOSIS — Z66 Do not resuscitate: Secondary | ICD-10-CM | POA: Diagnosis not present

## 2015-12-03 DIAGNOSIS — J449 Chronic obstructive pulmonary disease, unspecified: Secondary | ICD-10-CM | POA: Diagnosis not present

## 2015-12-03 DIAGNOSIS — N179 Acute kidney failure, unspecified: Secondary | ICD-10-CM | POA: Diagnosis present

## 2015-12-03 DIAGNOSIS — R791 Abnormal coagulation profile: Secondary | ICD-10-CM | POA: Diagnosis not present

## 2015-12-03 DIAGNOSIS — R579 Shock, unspecified: Secondary | ICD-10-CM | POA: Diagnosis not present

## 2015-12-03 DIAGNOSIS — Y92008 Other place in unspecified non-institutional (private) residence as the place of occurrence of the external cause: Secondary | ICD-10-CM | POA: Diagnosis not present

## 2015-12-03 DIAGNOSIS — Z87891 Personal history of nicotine dependence: Secondary | ICD-10-CM

## 2015-12-03 DIAGNOSIS — S72001A Fracture of unspecified part of neck of right femur, initial encounter for closed fracture: Secondary | ICD-10-CM

## 2015-12-03 DIAGNOSIS — I9589 Other hypotension: Secondary | ICD-10-CM | POA: Diagnosis not present

## 2015-12-03 DIAGNOSIS — R6521 Severe sepsis with septic shock: Secondary | ICD-10-CM

## 2015-12-03 DIAGNOSIS — Z7951 Long term (current) use of inhaled steroids: Secondary | ICD-10-CM | POA: Diagnosis not present

## 2015-12-03 DIAGNOSIS — Z515 Encounter for palliative care: Secondary | ICD-10-CM | POA: Diagnosis not present

## 2015-12-03 DIAGNOSIS — Z9981 Dependence on supplemental oxygen: Secondary | ICD-10-CM | POA: Diagnosis not present

## 2015-12-03 DIAGNOSIS — K5900 Constipation, unspecified: Secondary | ICD-10-CM | POA: Diagnosis not present

## 2015-12-03 DIAGNOSIS — D72829 Elevated white blood cell count, unspecified: Secondary | ICD-10-CM | POA: Diagnosis not present

## 2015-12-03 DIAGNOSIS — Z9289 Personal history of other medical treatment: Secondary | ICD-10-CM

## 2015-12-03 DIAGNOSIS — R296 Repeated falls: Secondary | ICD-10-CM | POA: Diagnosis present

## 2015-12-03 DIAGNOSIS — S7221XA Displaced subtrochanteric fracture of right femur, initial encounter for closed fracture: Principal | ICD-10-CM | POA: Diagnosis present

## 2015-12-03 DIAGNOSIS — Z452 Encounter for adjustment and management of vascular access device: Secondary | ICD-10-CM

## 2015-12-03 DIAGNOSIS — I482 Chronic atrial fibrillation: Secondary | ICD-10-CM | POA: Diagnosis not present

## 2015-12-03 DIAGNOSIS — T148XXA Other injury of unspecified body region, initial encounter: Secondary | ICD-10-CM

## 2015-12-03 DIAGNOSIS — E079 Disorder of thyroid, unspecified: Secondary | ICD-10-CM

## 2015-12-03 DIAGNOSIS — Z7901 Long term (current) use of anticoagulants: Secondary | ICD-10-CM

## 2015-12-03 DIAGNOSIS — I5033 Acute on chronic diastolic (congestive) heart failure: Secondary | ICD-10-CM | POA: Diagnosis not present

## 2015-12-03 DIAGNOSIS — W010XXA Fall on same level from slipping, tripping and stumbling without subsequent striking against object, initial encounter: Secondary | ICD-10-CM | POA: Diagnosis present

## 2015-12-03 DIAGNOSIS — T380X5A Adverse effect of glucocorticoids and synthetic analogues, initial encounter: Secondary | ICD-10-CM | POA: Diagnosis not present

## 2015-12-03 DIAGNOSIS — I10 Essential (primary) hypertension: Secondary | ICD-10-CM | POA: Diagnosis not present

## 2015-12-03 DIAGNOSIS — Z6826 Body mass index (BMI) 26.0-26.9, adult: Secondary | ICD-10-CM | POA: Diagnosis not present

## 2015-12-03 DIAGNOSIS — N183 Chronic kidney disease, stage 3 (moderate): Secondary | ICD-10-CM | POA: Diagnosis present

## 2015-12-03 DIAGNOSIS — S72009A Fracture of unspecified part of neck of unspecified femur, initial encounter for closed fracture: Secondary | ICD-10-CM

## 2015-12-03 DIAGNOSIS — I4891 Unspecified atrial fibrillation: Secondary | ICD-10-CM | POA: Diagnosis not present

## 2015-12-03 DIAGNOSIS — G9340 Encephalopathy, unspecified: Secondary | ICD-10-CM | POA: Diagnosis not present

## 2015-12-03 DIAGNOSIS — W19XXXA Unspecified fall, initial encounter: Secondary | ICD-10-CM

## 2015-12-03 DIAGNOSIS — R739 Hyperglycemia, unspecified: Secondary | ICD-10-CM | POA: Diagnosis not present

## 2015-12-03 DIAGNOSIS — J9621 Acute and chronic respiratory failure with hypoxia: Secondary | ICD-10-CM | POA: Diagnosis not present

## 2015-12-03 DIAGNOSIS — R34 Anuria and oliguria: Secondary | ICD-10-CM | POA: Diagnosis not present

## 2015-12-03 DIAGNOSIS — M25551 Pain in right hip: Secondary | ICD-10-CM | POA: Diagnosis present

## 2015-12-03 DIAGNOSIS — J9620 Acute and chronic respiratory failure, unspecified whether with hypoxia or hypercapnia: Secondary | ICD-10-CM

## 2015-12-03 DIAGNOSIS — I5031 Acute diastolic (congestive) heart failure: Secondary | ICD-10-CM | POA: Diagnosis not present

## 2015-12-03 DIAGNOSIS — J9600 Acute respiratory failure, unspecified whether with hypoxia or hypercapnia: Secondary | ICD-10-CM

## 2015-12-03 DIAGNOSIS — J441 Chronic obstructive pulmonary disease with (acute) exacerbation: Secondary | ICD-10-CM | POA: Diagnosis not present

## 2015-12-03 DIAGNOSIS — R0603 Acute respiratory distress: Secondary | ICD-10-CM

## 2015-12-03 DIAGNOSIS — J810 Acute pulmonary edema: Secondary | ICD-10-CM | POA: Diagnosis not present

## 2015-12-03 LAB — CBC WITH DIFFERENTIAL/PLATELET
BASOS ABS: 0 10*3/uL (ref 0.0–0.1)
Basophils Relative: 0 %
Eosinophils Absolute: 0.1 10*3/uL (ref 0.0–0.7)
Eosinophils Relative: 1 %
HEMATOCRIT: 39.2 % (ref 39.0–52.0)
HEMOGLOBIN: 13 g/dL (ref 13.0–17.0)
LYMPHS ABS: 0.6 10*3/uL — AB (ref 0.7–4.0)
LYMPHS PCT: 6 %
MCH: 30.8 pg (ref 26.0–34.0)
MCHC: 33.2 g/dL (ref 30.0–36.0)
MCV: 92.9 fL (ref 78.0–100.0)
Monocytes Absolute: 0.9 10*3/uL (ref 0.1–1.0)
Monocytes Relative: 8 %
NEUTROS ABS: 8.8 10*3/uL — AB (ref 1.7–7.7)
NEUTROS PCT: 85 %
Platelets: 241 10*3/uL (ref 150–400)
RBC: 4.22 MIL/uL (ref 4.22–5.81)
RDW: 15.7 % — ABNORMAL HIGH (ref 11.5–15.5)
WBC: 10.4 10*3/uL (ref 4.0–10.5)

## 2015-12-03 LAB — PROTIME-INR
INR: 2.19 — AB (ref 0.00–1.49)
PROTHROMBIN TIME: 24.2 s — AB (ref 11.6–15.2)

## 2015-12-03 LAB — COMPREHENSIVE METABOLIC PANEL
ALK PHOS: 84 U/L (ref 38–126)
ALT: 20 U/L (ref 17–63)
AST: 22 U/L (ref 15–41)
Albumin: 3.3 g/dL — ABNORMAL LOW (ref 3.5–5.0)
Anion gap: 13 (ref 5–15)
BUN: 23 mg/dL — AB (ref 6–20)
CALCIUM: 8.6 mg/dL — AB (ref 8.9–10.3)
CO2: 21 mmol/L — ABNORMAL LOW (ref 22–32)
CREATININE: 1.83 mg/dL — AB (ref 0.61–1.24)
Chloride: 108 mmol/L (ref 101–111)
GFR, EST AFRICAN AMERICAN: 40 mL/min — AB (ref >60)
GFR, EST NON AFRICAN AMERICAN: 34 mL/min — AB (ref >60)
Glucose, Bld: 126 mg/dL — ABNORMAL HIGH (ref 65–99)
Potassium: 4.9 mmol/L (ref 3.5–5.1)
Sodium: 142 mmol/L (ref 135–145)
Total Bilirubin: 0.6 mg/dL (ref 0.3–1.2)
Total Protein: 5.4 g/dL — ABNORMAL LOW (ref 6.5–8.1)

## 2015-12-03 LAB — ABO/RH: ABO/RH(D): A POS

## 2015-12-03 MED ORDER — ONDANSETRON HCL 4 MG/2ML IJ SOLN: 4.0000 mg | Freq: Four times a day (QID) | INTRAMUSCULAR | Status: DC

## 2015-12-03 MED ORDER — BISACODYL 10 MG RE SUPP
10.0000 mg | Freq: Every day | RECTAL | Status: DC | PRN
  Administered 2015-12-07 – 2015-12-11 (×2): 10 mg via RECTAL
  Filled 2015-12-03 (×2): qty 1

## 2015-12-03 MED ORDER — SODIUM CHLORIDE 0.9 % IV SOLN
INTRAVENOUS | Status: DC
  Administered 2015-12-03 (×2): via INTRAVENOUS

## 2015-12-03 MED ORDER — ONDANSETRON HCL 4 MG/2ML IJ SOLN
4.0000 mg | Freq: Once | INTRAMUSCULAR | Status: AC
  Administered 2015-12-03: 4 mg via INTRAVENOUS
  Filled 2015-12-03: qty 2

## 2015-12-03 MED ORDER — PANTOPRAZOLE SODIUM 40 MG IV SOLR
40.0000 mg | INTRAVENOUS | Status: DC
  Administered 2015-12-03 – 2015-12-04 (×2): 40 mg via INTRAVENOUS
  Filled 2015-12-03 (×2): qty 40

## 2015-12-03 MED ORDER — AMIODARONE HCL 200 MG PO TABS
200.0000 mg | ORAL_TABLET | Freq: Every day | ORAL | Status: DC
  Administered 2015-12-05 – 2015-12-11 (×7): 200 mg via ORAL
  Filled 2015-12-03 (×9): qty 1

## 2015-12-03 MED ORDER — LEVALBUTEROL HCL 1.25 MG/0.5ML IN NEBU
1.2500 mg | INHALATION_SOLUTION | Freq: Two times a day (BID) | RESPIRATORY_TRACT | Status: DC | PRN
  Administered 2015-12-04: 1.25 mg via RESPIRATORY_TRACT
  Filled 2015-12-03 (×3): qty 0.5

## 2015-12-03 MED ORDER — MOMETASONE FURO-FORMOTEROL FUM 200-5 MCG/ACT IN AERO
2.0000 | INHALATION_SPRAY | Freq: Two times a day (BID) | RESPIRATORY_TRACT | Status: DC
  Filled 2015-12-03: qty 8.8

## 2015-12-03 MED ORDER — ALBUTEROL SULFATE HFA 108 (90 BASE) MCG/ACT IN AERS: 2.0000 | INHALATION_SPRAY | RESPIRATORY_TRACT | Status: DC | PRN

## 2015-12-03 MED ORDER — CLINDAMYCIN PHOSPHATE 600 MG/50ML IV SOLN
600.0000 mg | INTRAVENOUS | Status: AC
  Administered 2015-12-04: 600 mg via INTRAVENOUS
  Filled 2015-12-03 (×2): qty 50

## 2015-12-03 MED ORDER — LEVOTHYROXINE SODIUM 75 MCG PO TABS
75.0000 ug | ORAL_TABLET | Freq: Every day | ORAL | Status: DC
  Administered 2015-12-05 – 2015-12-08 (×4): 75 ug via ORAL
  Filled 2015-12-03 (×4): qty 1

## 2015-12-03 MED ORDER — SODIUM CHLORIDE 0.9 % IV BOLUS (SEPSIS)
250.0000 mL | Freq: Once | INTRAVENOUS | Status: AC
  Administered 2015-12-03: 250 mL via INTRAVENOUS

## 2015-12-03 MED ORDER — TIOTROPIUM BROMIDE MONOHYDRATE 18 MCG IN CAPS
18.0000 ug | ORAL_CAPSULE | Freq: Every day | RESPIRATORY_TRACT | Status: DC
  Filled 2015-12-03: qty 5

## 2015-12-03 MED ORDER — HYDROMORPHONE HCL 1 MG/ML IJ SOLN
0.5000 mg | INTRAMUSCULAR | Status: DC | PRN
  Administered 2015-12-04: 0.5 mg via INTRAVENOUS
  Filled 2015-12-03: qty 1

## 2015-12-03 MED ORDER — ALBUTEROL SULFATE (2.5 MG/3ML) 0.083% IN NEBU: 2.5000 mg | INHALATION_SOLUTION | RESPIRATORY_TRACT | Status: DC | PRN

## 2015-12-03 MED ORDER — VITAMIN K1 10 MG/ML IJ SOLN
1.0000 mg | Freq: Once | INTRAVENOUS | Status: AC
  Administered 2015-12-03: 1 mg via INTRAVENOUS
  Filled 2015-12-03: qty 0.1

## 2015-12-03 MED ORDER — SODIUM CHLORIDE 0.9 % IV SOLN: Freq: Once | INTRAVENOUS | Status: DC

## 2015-12-03 MED ORDER — HYDROMORPHONE HCL 1 MG/ML IJ SOLN
1.0000 mg | Freq: Once | INTRAMUSCULAR | Status: AC
  Administered 2015-12-03: 1 mg via INTRAVENOUS
  Filled 2015-12-03: qty 1

## 2015-12-03 MED ORDER — ONDANSETRON HCL 4 MG/2ML IJ SOLN
4.0000 mg | Freq: Four times a day (QID) | INTRAMUSCULAR | Status: DC | PRN
  Administered 2015-12-04: 4 mg via INTRAVENOUS

## 2015-12-03 MED ORDER — SODIUM CHLORIDE 0.9 % IV SOLN: INTRAVENOUS | Status: DC

## 2015-12-03 NOTE — ED Notes (Signed)
To ED via [facility] with c/o hip injury -- tripped going into house this afternoon, has obvious deformity to right hip -- positive  Right pedal pulse, received Fentanyl 250 mg enroute-- pt is alert/oriented x 4. Pain is 3/10 as long as not being moved.

## 2015-12-03 NOTE — H&P (Signed)
History and Physical  Stephen [name] ZOX:[id number] DOB: 1941/06/30 DOA: 11/19/2015  PCP: Rogelia Boga, MD   Chief Complaint: Fall  History of Present Illness:  Patient is a 75 yo male with history of Afib, recurrent falls, advanced COPD, hypothyroidism, BPH, HTN who was brought due to fall. He said he tripped while he was trying to move from his porch. He fell on his right hip and developed a bruise/pain at the site with no head trauma. He denied any dizziness, lightheadedness, chest pain, N/V/D/C/Abd pain. He has chronic dyspnea due to COPD with exertion. He denied new cough, fever or chills. He has no other complaints.   Review of Systems:  CONSTITUTIONAL:  No night sweats.  No fatigue, malaise, lethargy.  No fever or chills. Eyes:  No visual changes.  No eye pain.  No eye discharge.   ENT:    No epistaxis.  No sinus pain.  No sore throat.  No ear pain.  No congestion. RESPIRATORY:  No cough.  No wheeze.  No hemoptysis.  +shortness of breath. CARDIOVASCULAR:  No chest pains.  No palpitations. GASTROINTESTINAL:  No abdominal pain.  No nausea or vomiting.  No diarrhea or constipation.  No hematemesis.  No hematochezia.  No melena. GENITOURINARY:  No urgency.  No frequency.  No dysuria.  No hematuria.  No obstructive symptoms.  No discharge.  No pain.  No significant abnormal bleeding. MUSCULOSKELETAL:  No musculoskeletal pain.  No joint swelling.  No arthritis. NEUROLOGICAL:  No confusion.  No weakness. No headache. No seizure. PSYCHIATRIC:  No depression. No anxiety. No suicidal ideation. SKIN:  No rashes.  No lesions.  No wounds. ENDOCRINE:  No unexplained weight loss.  No polydipsia.  No polyuria.  No polyphagia. HEMATOLOGIC:  No anemia.  No purpura.  No petechiae.  No bleeding.  ALLERGIC AND IMMUNOLOGIC:  No pruritus.  No swelling Other:  Past Medical and Surgical History:   Past Medical History  Diagnosis Date  . ASTHMA 04/22/2007  . ATRIAL FIBRILLATION,  PAROXYSMAL 05/28/2007  . BENIGN PROSTATIC HYPERTROPHY 04/20/2008  . COPD 05/28/2007  . HYPERTENSION 04/22/2007  . HYPOTENSION 10/10/2010  . 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 Complication of anesthesia     difficulty waking up  . On home oxygen therapy     intermittent, uses with increased activity  . Shortness of breath   . Neuromuscular disorder ([facility])   . Arthritis   . Peroneal neuropathy     L    Past Surgical History  Procedure Laterality Date  . Tonsillectomy    . Colonoscopy      Social History:   reports that he quit smoking about 32 years ago. He has never used smokeless tobacco. He reports that he does not drink alcohol or use illicit drugs.   Allergies  Allergen Reactions  . Penicillins Other (See Comments)    REACTION: feels drunk    No family history on file.    Prior to Admission medications   Medication Sig Start Date End Date Taking? Authorizing Provider  albuterol (PROVENTIL HFA;VENTOLIN HFA) 108 (90 BASE) MCG/ACT inhaler Inhale 2 puffs into the lungs every 6 (six) hours as needed. For shortness of breath 11/17/10  Yes Gordy Savers, MD  AMBULATORY NON FORMULARY MEDICATION Inhale 2 L into the lungs continuous. Medication Name: oxygen 2 liters   Yes Historical Provider, MD  diltiazem (DILACOR XR) 120 MG 24 hr capsule Take 1 capsule (120 mg total) by mouth daily. 10/24/15  Yes Gordy SaversPeter F Kwiatkowski, MD  levalbuterol Pauline Aus(XOPENEX) 1.25 MG/3ML nebulizer solution inhale contents of 1 vial in nebulizer every 6 hours Patient taking differently: inhale contents of 1 vial in nebulizer every 6 hours as needed for SOB 11/12/14  Yes Gordy SaversPeter F Kwiatkowski, MD  levothyroxine (SYNTHROID, LEVOTHROID) 75 MCG tablet take 1 tablet by mouth every morning ON AN EMPTY STOMACH 06/09/15  Yes Gordy SaversPeter F Kwiatkowski, MD  meclizine (ANTIVERT) 25 MG tablet Take 1 tablet (25 mg total) by mouth 4 (four) times daily as needed for dizziness. 10/19/14  Yes Gordy SaversPeter F Kwiatkowski, MD  Multiple  Vitamin (MULTIVITAMIN) tablet Take 1 tablet by mouth daily.     Yes Historical Provider, MD  PACERONE 200 MG tablet take 1 tablet by mouth once daily 08/26/15  Yes Gordy SaversPeter F Kwiatkowski, MD  polyethylene glycol [facility](MIRALAX / GLYCOLAX) packet Take 17 g by mouth daily as needed. For constipation   Yes Historical Provider, MD  [facility] 18 MCG inhalation capsule inhale contents of 1 capsule by mouth once daily 02/09/15  Yes Gordy SaversPeter F Kwiatkowski, MD  tamsulosin [facility](FLOMAX) 0.4 MG CAPS capsule take 1 capsule by mouth twice a day 03/09/15  Yes Gordy SaversPeter F Kwiatkowski, MD  warfarin (COUMADIN) 2.5 MG tablet Take as directed by anticoagulation clinic Patient taking differently: Take 2.5-5 mg by mouth daily at 6 PM. Takes 5mg  on Mon and Fri  Takes 2.5mg  all other days 09/27/15  Yes Gordy SaversPeter F Kwiatkowski, MD  ADVAIR DISKUS 250-50 MCG/DOSE AEPB inhale 1 dose by mouth twice a day 08/08/15   Gordy SaversPeter F Kwiatkowski, MD  doxycycline (VIBRAMYCIN) 100 MG capsule Take 1 capsule (100 mg total) by mouth 2 (two) times daily. 09/09/15   Tharon AquasFrank C Patrick, PA  predniSONE (DELTASONE) 10 MG tablet Take 2 tablets (20 mg total) by mouth 2 (two) times daily with a meal. 09/09/15   Tharon AquasFrank C Patrick, PA    Physical Exam: BP 117/73 mmHg  Pulse 81  Temp(Src) 97.7 F (36.5 C) (Oral)  Resp 16  SpO2 92%  GENERAL : Well developed, well nourished, alert and cooperative, and appears to be in mild acute distress. HEAD: normocephalic. EARS:  hearing grossly intact. NOSE: No nasal discharge. NECK: Neck supple CARDIAC: Normal S1 and S2. No S3, S4 or murmurs. Rhythm is regular. LUNGS: Clear to auscultation  ABDOMEN: Positive bowel sounds. Soft, nondistended, nontender.  MUSKULOSKELETAL: lateral rotation to right hip with flexion at knee level. He have pulse in right dorsalis pedis. No tingling/numbness. Intact sensation in leg/foot. Bruise in right hip. ( examined by ER. Patient in pain, defer exam to ortho. No hematoma. Intact pulse and  sensation) NEUROLOGICAL: The mental examination revealed the patient was oriented to person, place, and time.CN II-XII intact.  PSYCHIATRIC:  The patient was able to demonstrate good judgement and reason, without hallucinations, abnormal affect or abnormal behaviors during the examination. Patient is not suicidal.          Labs on Admission:  Reviewed.   Radiological Exams on Admission: Dg Chest 1 View  11/30/2015  CLINICAL DATA:  Pt reports he tripped and fell in his living room today as he was walking into the house and landed on his right side; obvious deformity to right leg; pt denies left hip pain; best obtainable images due to pt's condition. EXAM: CHEST 1 VIEW COMPARISON:  08/14/2011 FINDINGS: Lungs are hyperinflated. Heart is mildly enlarged. Aorta is tortuous and densely calcified. Moderate emphysematous changes are present. There is perihilar bronchitic change. There are no  focal consolidations or pleural effusions. Bilateral mid lung zone atelectasis or scarring is noted. No pulmonary edema. No evidence for pneumothorax or acute fracture. IMPRESSION: 1. Emphysematous changes and bronchitic changes. 2. Atelectasis or scarring in the mid lung zones bilaterally. 3. No evidence for pneumothorax or acute fracture. Electronically Signed   By: Norva Pavlov M.D.   On: 12/02/2015 17:27   Dg Hips Bilat With Pelvis 3-4 Views  12/11/2015  CLINICAL DATA:  Fall with right hip pain EXAM: DG HIP (WITH OR WITHOUT PELVIS) 3-4V BILAT COMPARISON:  None. FINDINGS: There is an oblique subtrochanteric fracture in the proximal right femur, with prominent apex anterior angulation, 2.5 cm medial displacement of the distal fracture fragment and 6 cm over riding of the fracture fragments. No additional fracture is seen in the pelvis or hips. No dislocation at the hip joints. No appreciable hip arthropathy. No pelvic diastasis. Diffuse osteopenia. No suspicious focal osseous lesions. IMPRESSION: Oblique, displaced,  overriding and angulated subtrochanteric right proximal femur fracture. Diffuse osteopenia. Electronically Signed   By: Delbert Phenix M.D.   On: 12/01/2015 17:28    EKG:  Independently reviewed. No afib.  Assessment/Plan  R.proximal femoral fx: Due to mechanical fall History of falls : consult PT in am Osteopenia in Xray: Ortho following, check VitD level. Pain: Dilaudid prn pain.  No hematoma: holding coumadin Awaiting Ortho recs reg surgery : keep NPO  CKD III: Continue IVF, avoid nephrotoxic meds  Afib:  Continue amiod, hold dilitiazem due to low BP, hold coumadin due to fx/surgery CHADS2: 2  HTN: hold dilitazem  BPH: hold Flomax for today  COPD:  On 2L [place] oxygen at home Continue inhaled steroids and spiriva  DVT prophylaxis: On coumadin, INR therapeutic, awaiting ortho recs. GI prophylaxis: PPI Consultants: Ortho Code Status: Full     Eston Esters M.D [facility]

## 2015-12-03 NOTE — Consult Note (Signed)
Reason for Consult: Referring Physician: Dr Dreama Saa Chief complaint right hip pain Stephen [name] is an 75 y.o. male.  HPI: Stephen [name] is a 23 year old ambulatory patient who fell over his front landing today. Denies any loss of consciousness and denies any other orthopedic complaints he does report right hip pain. He is had no prior abdominal surgery  Past Medical History  Diagnosis Date  . ASTHMA 04/22/2007  . ATRIAL FIBRILLATION, PAROXYSMAL 05/28/2007  . BENIGN PROSTATIC HYPERTROPHY 04/20/2008  . COPD 05/28/2007  . HYPERTENSION 04/22/2007  . HYPOTENSION 10/10/2010  . 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 Complication of anesthesia     difficulty waking up  . On home oxygen therapy     intermittent, uses with increased activity  . Shortness of breath   . Neuromuscular disorder ([place])   . Arthritis   . Peroneal neuropathy     L     Past Surgical History  Procedure Laterality Date  . Tonsillectomy    . Colonoscopy      No family history on file.  Social History:  reports that he quit smoking about 32 years ago. He has never used smokeless tobacco. He reports that he does not drink alcohol or use illicit drugs.  Allergies:  Allergies  Allergen Reactions  . Penicillins Other (See Comments)    REACTION: feels drunk    Medications: I have reviewed the patient's current medications.  Results for orders placed or performed during the hospital encounter of 12/15/2015 (from the past 48 hour(s))  CBC with Differential/Platelet     Status: Abnormal   Collection Time: 12/14/2015  4:22 PM  Result Value Ref Range   WBC 10.4 4.0 - 10.5 K/uL   RBC 4.22 4.22 - 5.81 MIL/uL   Hemoglobin 13.0 13.0 - 17.0 g/dL   HCT 39.2 39.0 - 52.0 %   MCV 92.9 78.0 - 100.0 fL   MCH 30.8 26.0 - 34.0 pg   MCHC 33.2 30.0 - 36.0 g/dL   RDW 15.7 (H) 11.5 - 15.5 %   Platelets 241 150 - 400 K/uL   Neutrophils Relative % 85 %   Neutro Abs 8.8 (H) 1.7 - 7.7 K/uL   Lymphocytes Relative 6 %   Lymphs Abs 0.6  (L) 0.7 - 4.0 K/uL   Monocytes Relative 8 %   Monocytes Absolute 0.9 0.1 - 1.0 K/uL   Eosinophils Relative 1 %   Eosinophils Absolute 0.1 0.0 - 0.7 K/uL   Basophils Relative 0 %   Basophils Absolute 0.0 0.0 - 0.1 K/uL  Comprehensive metabolic panel     Status: Abnormal   Collection Time: 12/06/2015  4:22 PM  Result Value Ref Range   Sodium 142 135 - 145 mmol/L   Potassium 4.9 3.5 - 5.1 mmol/L   Chloride 108 101 - 111 mmol/L   CO2 21 (L) 22 - 32 mmol/L   Glucose, Bld 126 (H) 65 - 99 mg/dL   BUN 23 (H) 6 - 20 mg/dL   Creatinine, Ser 1.83 (H) 0.61 - 1.24 mg/dL   Calcium 8.6 (L) 8.9 - 10.3 mg/dL   Total Protein 5.4 (L) 6.5 - 8.1 g/dL   Albumin 3.3 (L) 3.5 - 5.0 g/dL   AST 22 15 - 41 U/L   ALT 20 17 - 63 U/L   Alkaline Phosphatase 84 38 - 126 U/L   Total Bilirubin 0.6 0.3 - 1.2 mg/dL   GFR calc non Af Amer 34 (L) >60 mL/min   GFR calc Af  Amer 40 (L) >60 mL/min    Comment: (NOTE) The eGFR has been calculated using the CKD EPI equation. This calculation has not been validated in all clinical situations. eGFR's persistently <60 mL/min signify possible Chronic Kidney Disease.    Anion gap 13 5 - 15  Protime-INR     Status: Abnormal   Collection Time: 12/02/2015  4:22 PM  Result Value Ref Range   Prothrombin Time 24.2 (H) 11.6 - 15.2 seconds   INR 2.19 (H) 0.00 - 1.49    Dg Chest 1 View  11/25/2015  CLINICAL DATA:  Pt reports he tripped and fell in his living room today as he was walking into the house and landed on his right side; obvious deformity to right leg; pt denies left hip pain; best obtainable images due to pt's condition. EXAM: CHEST 1 VIEW COMPARISON:  08/14/2011 FINDINGS: Lungs are hyperinflated. Heart is mildly enlarged. Aorta is tortuous and densely calcified. Moderate emphysematous changes are present. There is perihilar bronchitic change. There are no focal consolidations or pleural effusions. Bilateral mid lung zone atelectasis or scarring is noted. No pulmonary edema. No  evidence for pneumothorax or acute fracture. IMPRESSION: 1. Emphysematous changes and bronchitic changes. 2. Atelectasis or scarring in the mid lung zones bilaterally. 3. No evidence for pneumothorax or acute fracture. Electronically Signed   By: Nolon Nations M.D.   On: 12/01/2015 17:27   Dg Hips Bilat With Pelvis 3-4 Views  12/09/2015  CLINICAL DATA:  Fall with right hip pain EXAM: DG HIP (WITH OR WITHOUT PELVIS) 3-4V BILAT COMPARISON:  None. FINDINGS: There is an oblique subtrochanteric fracture in the proximal right femur, with prominent apex anterior angulation, 2.5 cm medial displacement of the distal fracture fragment and 6 cm over riding of the fracture fragments. No additional fracture is seen in the pelvis or hips. No dislocation at the hip joints. No appreciable hip arthropathy. No pelvic diastasis. Diffuse osteopenia. No suspicious focal osseous lesions. IMPRESSION: Oblique, displaced, overriding and angulated subtrochanteric right proximal femur fracture. Diffuse osteopenia. Electronically Signed   By: Ilona Sorrel M.D.   On: 11/28/2015 17:28    Review of Systems  Constitutional: Negative.   HENT: Negative.   Eyes: Negative.   Respiratory: Negative.   Cardiovascular: Negative.   Gastrointestinal: Positive for nausea.  Genitourinary: Negative.   Musculoskeletal: Positive for joint pain.  Skin: Negative.   Neurological: Negative.   Endo/Heme/Allergies: Negative.   Psychiatric/Behavioral: Negative.    Blood pressure 133/74, pulse 88, temperature 98.8 F (37.1 C), temperature source Oral, resp. rate 20, height _0  (1.626 m), weight 55.3 kg (121 lb 14.6 oz), SpO2 90 %. Physical Exam  Constitutional: He appears well-developed.  HENT:  Head: Normocephalic.  Eyes: Pupils are equal, round, and reactive to light.  Neck: Normal range of motion.  Cardiovascular: Normal rate.   Respiratory: Effort normal.  GI: He exhibits distension.  Neurological: He is alert.  Skin: Skin is  warm.  Psychiatric: He has a normal mood and affect.  Examination of the right lower shoulder region demonstrates soft compartments palpable pedal pulses intact toe flexion and extension does have some sensation on the dorsal plantar aspect of the foot left lower extremity around the knee and ankle and hip has no pain with range of motion bilateral upper extremities wrist elbow shoulders generally intact. Radial pulses intact bilaterally sensation in the hands intact neck range of motion nontender  Assessment/Plan: #1 right hip subtrochanteric femur fracture 2 part. This will need one cable  and an intramedullary nail with proximal and distal interlocks. The cement of his discussed with the patient could not limited to infection or vessel damage incomplete healing. All questions answered about the hip fracture. The only real issue with the hip fracture is that the patient's INR is 2.1. Admit to give him 1 mg of vitamin K tonight IV have 2 units of FFP available tomorrow 1 to start at 6:00 in the morning. INR to be drawn at 5:00 in the morning.  #2 umbilical hernia with abdominal distention plan for this is KUB with general surgical consult. He has what feels like a silver dollar sized supraumbilical hernia with some abdominal distention but no real guarding. Difficult to say if the bowel protruding into this hernia can be reduced but he does not have a white count and fevers. KUB is pending general surgery consult also pending on this supraumbilical hernia.  DEAN,GREGORY SCOTT 11/23/2015, 10:51 PM

## 2015-12-03 NOTE — Consult Note (Signed)
Reason for Consult:umbilical hernia Referring Physician: Zyier [name] is an 75 y.o. male.  HPI: Stephen [name] fell earlier today and suffered a R subtrochanteric hip fracture. He also has a 1 year history of an umbilical hernia. It occasionally gives him some discomfort. It gets smaller when he lays down but never totally goes away.   Past Medical History  Diagnosis Date  . ASTHMA 04/22/2007  . ATRIAL FIBRILLATION, PAROXYSMAL 05/28/2007  . BENIGN PROSTATIC HYPERTROPHY 04/20/2008  . COPD 05/28/2007  . HYPERTENSION 04/22/2007  . HYPOTENSION 10/10/2010  . 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 Complication of anesthesia     difficulty waking up  . On home oxygen therapy     intermittent, uses with increased activity  . Shortness of breath   . Neuromuscular disorder ([place])   . Arthritis   . Peroneal neuropathy     L     Past Surgical History  Procedure Laterality Date  . Tonsillectomy    . Colonoscopy      No family history on file.  Social History:  reports that he quit smoking about 32 years ago. He has never used smokeless tobacco. He reports that he does not drink alcohol or use illicit drugs.  Allergies:  Allergies  Allergen Reactions  . Penicillins Other (See Comments)    REACTION: feels drunk    Medications:  Scheduled: . sodium chloride   Intravenous STAT  . sodium chloride   Intravenous Once  . [START ON 11/19/2015] amiodarone  200 mg Oral Daily  . [START ON 12/14/2015] levothyroxine  75 mcg Oral QAC breakfast  . mometasone-formoterol  2 puff Inhalation BID  . pantoprazole (PROTONIX) IV  40 mg Intravenous Q24H  . phytonadione (VITAMIN K) IV  1 mg Intravenous Once  . tiotropium  18 mcg Inhalation Daily   Continuous: . sodium chloride 75 mL/hr at 11/23/2015 2253   ECX:FQHKUVJDY, HYDROmorphone (DILAUDID) injection, levalbuterol, ondansetron (ZOFRAN) IV  Results for orders placed or performed during the hospital encounter of 11/29/2015 (from the past 48  hour(s))  CBC with Differential/Platelet     Status: Abnormal   Collection Time: 12/14/2015  4:22 PM  Result Value Ref Range   WBC 10.4 4.0 - 10.5 K/uL   RBC 4.22 4.22 - 5.81 MIL/uL   Hemoglobin 13.0 13.0 - 17.0 g/dL   HCT 39.2 39.0 - 52.0 %   MCV 92.9 78.0 - 100.0 fL   MCH 30.8 26.0 - 34.0 pg   MCHC 33.2 30.0 - 36.0 g/dL   RDW 15.7 (H) 11.5 - 15.5 %   Platelets 241 150 - 400 K/uL   Neutrophils Relative % 85 %   Neutro Abs 8.8 (H) 1.7 - 7.7 K/uL   Lymphocytes Relative 6 %   Lymphs Abs 0.6 (L) 0.7 - 4.0 K/uL   Monocytes Relative 8 %   Monocytes Absolute 0.9 0.1 - 1.0 K/uL   Eosinophils Relative 1 %   Eosinophils Absolute 0.1 0.0 - 0.7 K/uL   Basophils Relative 0 %   Basophils Absolute 0.0 0.0 - 0.1 K/uL  Comprehensive metabolic panel     Status: Abnormal   Collection Time: 11/24/2015  4:22 PM  Result Value Ref Range   Sodium 142 135 - 145 mmol/L   Potassium 4.9 3.5 - 5.1 mmol/L   Chloride 108 101 - 111 mmol/L   CO2 21 (L) 22 - 32 mmol/L   Glucose, Bld 126 (H) 65 - 99 mg/dL   BUN 23 (H)  6 - 20 mg/dL   Creatinine, Ser 1.83 (H) 0.61 - 1.24 mg/dL   Calcium 8.6 (L) 8.9 - 10.3 mg/dL   Total Protein 5.4 (L) 6.5 - 8.1 g/dL   Albumin 3.3 (L) 3.5 - 5.0 g/dL   AST 22 15 - 41 U/L   ALT 20 17 - 63 U/L   Alkaline Phosphatase 84 38 - 126 U/L   Total Bilirubin 0.6 0.3 - 1.2 mg/dL   GFR calc non Af Amer 34 (L) >60 mL/min   GFR calc Af Amer 40 (L) >60 mL/min    Comment: (NOTE) The eGFR has been calculated using the CKD EPI equation. This calculation has not been validated in all clinical situations. eGFR's persistently <60 mL/min signify possible Chronic Kidney Disease.    Anion gap 13 5 - 15  Protime-INR     Status: Abnormal   Collection Time: 12/08/2015  4:22 PM  Result Value Ref Range   Prothrombin Time 24.2 (H) 11.6 - 15.2 seconds   INR 2.19 (H) 0.00 - 1.49    Dg Chest 1 View  11/17/2015  CLINICAL DATA:  Pt reports he tripped and fell in his living room today as he was walking  into the house and landed on his right side; obvious deformity to right leg; pt denies left hip pain; best obtainable images due to pt's condition. EXAM: CHEST 1 VIEW COMPARISON:  08/14/2011 FINDINGS: Lungs are hyperinflated. Heart is mildly enlarged. Aorta is tortuous and densely calcified. Moderate emphysematous changes are present. There is perihilar bronchitic change. There are no focal consolidations or pleural effusions. Bilateral mid lung zone atelectasis or scarring is noted. No pulmonary edema. No evidence for pneumothorax or acute fracture. IMPRESSION: 1. Emphysematous changes and bronchitic changes. 2. Atelectasis or scarring in the mid lung zones bilaterally. 3. No evidence for pneumothorax or acute fracture. Electronically Signed   By: Nolon Nations M.D.   On: 11/18/2015 17:27   Dg Abd Portable 1v  12/09/2015  CLINICAL DATA:  Assess for small bowel obstruction. Initial encounter. EXAM: PORTABLE ABDOMEN - 1 VIEW COMPARISON:  MRI of the lumbar spine performed 05/23/2011 FINDINGS: The stomach is partially filled with air. The colon is partially filled with stool. No free intra-abdominal air is seen, though evaluation for free air is limited on a single supine view. No free intra-abdominal air is seen, though evaluation for free air is limited on a single supine view. No acute osseous abnormalities are seen. The visualized lung bases are grossly clear. IMPRESSION: Unremarkable bowel gas pattern; no free intra-abdominal air seen. Moderate amount of stool noted in the colon. Stomach partially filled with air. Electronically Signed   By: Garald Balding M.D.   On: 12/12/2015 23:03   Dg Hips Bilat With Pelvis 3-4 Views  12/04/2015  CLINICAL DATA:  Fall with right hip pain EXAM: DG HIP (WITH OR WITHOUT PELVIS) 3-4V BILAT COMPARISON:  None. FINDINGS: There is an oblique subtrochanteric fracture in the proximal right femur, with prominent apex anterior angulation, 2.5 cm medial displacement of the distal  fracture fragment and 6 cm over riding of the fracture fragments. No additional fracture is seen in the pelvis or hips. No dislocation at the hip joints. No appreciable hip arthropathy. No pelvic diastasis. Diffuse osteopenia. No suspicious focal osseous lesions. IMPRESSION: Oblique, displaced, overriding and angulated subtrochanteric right proximal femur fracture. Diffuse osteopenia. Electronically Signed   By: Ilona Sorrel M.D.   On: 12/08/2015 17:28    Review of Systems  Constitutional: Negative  for fever.  HENT: Negative.   Eyes: Negative for blurred vision.  Respiratory: Negative for cough.   Cardiovascular: Negative for chest pain.  Gastrointestinal: Negative for nausea, vomiting and abdominal pain.  Genitourinary: Negative.   Musculoskeletal:       R hip pain  Skin: Negative.   Neurological: Negative.   Endo/Heme/Allergies: Bruises/bleeds easily.  Psychiatric/Behavioral: Negative.    Blood pressure 133/74, pulse 88, temperature 98.8 F (37.1 C), temperature source Oral, resp. rate 20, height _0  (1.626 m), weight 55.3 kg (121 lb 14.6 oz), SpO2 90 %. Physical Exam  Constitutional: He is oriented to person, place, and time. He appears well-developed and well-nourished. No distress.  HENT:  Head: Normocephalic.  Right Ear: External ear normal.  Left Ear: External ear normal.  Nose: Nose normal.  Mouth/Throat: Oropharynx is clear and moist.  Neck: Neck supple. No tracheal deviation present.  Cardiovascular:  irreg irreg  Respiratory: Effort normal and breath sounds normal. No respiratory distress. He has no wheezes. He has no rales.  GI: Soft. He exhibits no distension. There is no tenderness. There is no rebound and no guarding.  Umbilical and supraumbilical hernia, partially reduces, no overlying skin changes  Musculoskeletal:  R hip deformity and tenderness  Neurological: He is alert and oriented to person, place, and time.  Skin: Skin is warm.  Psychiatric: He has a  normal mood and affect.    Assessment/Plan: Umbilical and supraumbilical hernia - partially reduces, seems to contain fat. No urgent treatment needed. I can see him in the office after he recovers from his hip surgery.  Illyria Sobocinski E 12/14/2015, 11:17 PM

## 2015-12-03 NOTE — ED Provider Notes (Signed)
CSN: [id number]     Arrival date & time 12/14/2015  1536 History   First MD Initiated Contact with Patient 11/24/2015 1545     Chief Complaint  Patient presents with  . Hip Injury     (Consider location/radiation/quality/duration/timing/severity/associated sxs/prior Treatment) The history is provided by the patient and the EMS personnel.   75 year old male tripped going through his door threshold falling resulting in a fracture to his right hip. Deformity noted EMS gave him pain medicine in route. No loss of consciousness no syncope. Upon arrival pain was 3 out of 10. Worse if he tried to move the right leg at all no other injuries. Patient has history of atrial fibrillation and is on Coumadin. Also has a history of asthma. Patient is also sometimes on oxygen as needed. Not on a record basis.  Past Medical History  Diagnosis Date  . ASTHMA 04/22/2007  . ATRIAL FIBRILLATION, PAROXYSMAL 05/28/2007  . BENIGN PROSTATIC HYPERTROPHY 04/20/2008  . COPD 05/28/2007  . HYPERTENSION 04/22/2007  . HYPOTENSION 10/10/2010  . 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 Complication of anesthesia     difficulty waking up  . On home oxygen therapy     intermittent, uses with increased activity  . Shortness of breath   . Neuromuscular disorder ([facility])   . Arthritis   . Peroneal neuropathy     L    Past Surgical History  Procedure Laterality Date  . Tonsillectomy    . Colonoscopy     No family history on file. Social History  Substance Use Topics  . Smoking status: Former Smoker    Quit date: 09/18/1983  . Smokeless tobacco: Never Used  . Alcohol Use: No    Review of Systems  Constitutional: Negative for fever.  HENT: Negative for congestion.   Eyes: Negative for visual disturbance.  Respiratory: Negative for shortness of breath.   Cardiovascular: Negative for chest pain.  Gastrointestinal: Negative for abdominal pain.  Genitourinary: Negative for dysuria.  Musculoskeletal: Negative for  back pain and neck pain.  Skin: Negative for wound.  Neurological: Negative for syncope and headaches.  Hematological: Bruises/bleeds easily.  Psychiatric/Behavioral: Negative for confusion.      Allergies  Penicillins  Home Medications   Prior to Admission medications   Medication Sig Start Date End Date Taking? Authorizing Provider  albuterol (PROVENTIL HFA;VENTOLIN HFA) 108 (90 BASE) MCG/ACT inhaler Inhale 2 puffs into the lungs every 6 (six) hours as needed. For shortness of breath 11/17/10  Yes Gordy Savers, MD  AMBULATORY NON FORMULARY MEDICATION Inhale 2 L into the lungs continuous. Medication Name: oxygen 2 liters   Yes Historical Provider, MD  diltiazem (DILACOR XR) 120 MG 24 hr capsule Take 1 capsule (120 mg total) by mouth daily. 10/24/15  Yes Gordy Savers, MD  levalbuterol Pauline Aus) 1.25 MG/3ML nebulizer solution inhale contents of 1 vial in nebulizer every 6 hours Patient taking differently: inhale contents of 1 vial in nebulizer every 6 hours as needed for SOB 11/12/14  Yes Gordy Savers, MD  levothyroxine (SYNTHROID, LEVOTHROID) 75 MCG tablet take 1 tablet by mouth every morning ON AN EMPTY STOMACH 06/09/15  Yes Gordy Savers, MD  meclizine (ANTIVERT) 25 MG tablet Take 1 tablet (25 mg total) by mouth 4 (four) times daily as needed for dizziness. 10/19/14  Yes Gordy Savers, MD  Multiple Vitamin (MULTIVITAMIN) tablet Take 1 tablet by mouth daily.     Yes Historical Provider, MD  PACERONE 200 MG tablet  take 1 tablet by mouth once daily 08/26/15  Yes Gordy SaversPeter F Kwiatkowski, MD  polyethylene glycol [facility](MIRALAX / Ethelene HalGLYCOLAX) packet Take 17 g by mouth daily as needed. For constipation   Yes Historical Provider, MD  [facility] 18 MCG inhalation capsule inhale contents of 1 capsule by mouth once daily 02/09/15  Yes Gordy SaversPeter F Kwiatkowski, MD  tamsulosin [facility](FLOMAX) 0.4 MG CAPS capsule take 1 capsule by mouth twice a day 03/09/15  Yes Gordy SaversPeter F Kwiatkowski, MD  warfarin  (COUMADIN) 2.5 MG tablet Take as directed by anticoagulation clinic Patient taking differently: Take 2.5-5 mg by mouth daily at 6 PM. Takes 5mg  on Mon and Fri  Takes 2.5mg  all other days 09/27/15  Yes Gordy SaversPeter F Kwiatkowski, MD  ADVAIR DISKUS 250-50 MCG/DOSE AEPB inhale 1 dose by mouth twice a day 08/08/15   Gordy SaversPeter F Kwiatkowski, MD  doxycycline (VIBRAMYCIN) 100 MG capsule Take 1 capsule (100 mg total) by mouth 2 (two) times daily. 09/09/15   Tharon AquasFrank C Patrick, PA  predniSONE (DELTASONE) 10 MG tablet Take 2 tablets (20 mg total) by mouth 2 (two) times daily with a meal. 09/09/15   Tharon AquasFrank C Patrick, PA   BP 117/73 mmHg  Pulse 81  Temp(Src) 97.7 F (36.5 C) (Oral)  Resp 16  SpO2 92% Physical Exam  Constitutional: He is oriented to person, place, and time. He appears well-developed and well-nourished. He appears distressed.  HENT:  Head: Normocephalic and atraumatic.  Mouth/Throat: Oropharynx is clear and moist.  Eyes: Conjunctivae and EOM are normal. Pupils are equal, round, and reactive to light.  Neck: Normal range of motion. Neck supple.  Cardiovascular: Normal rate, regular rhythm and normal heart sounds.   No murmur heard. Pulmonary/Chest: Effort normal and breath sounds normal. No respiratory distress.  Abdominal: Soft. Bowel sounds are normal. There is no tenderness.  Musculoskeletal: Normal range of motion. He exhibits tenderness.  Deformity to the right hip. Patient with his leg bent at the knee more comfortable that way. Good cap refill to the toes 1 second. Good movement of the toes sensation intact.  Neurological: He is alert and oriented to person, place, and time. No cranial nerve deficit. Coordination normal.  Skin: Skin is warm.  Nursing note and vitals reviewed.   ED Course  Procedures (including critical care time) Labs Review Labs Reviewed  CBC WITH DIFFERENTIAL/PLATELET - Abnormal; Notable for the following:    RDW 15.7 (*)    Neutro Abs 8.8 (*)    Lymphs Abs 0.6 (*)     All other components within normal limits  COMPREHENSIVE METABOLIC PANEL - Abnormal; Notable for the following:    CO2 21 (*)    Glucose, Bld 126 (*)    BUN 23 (*)    Creatinine, Ser 1.83 (*)    Calcium 8.6 (*)    Total Protein 5.4 (*)    Albumin 3.3 (*)    GFR calc non Af Amer 34 (*)    GFR calc Af Amer 40 (*)    All other components within normal limits  PROTIME-INR - Abnormal; Notable for the following:    Prothrombin Time 24.2 (*)    INR 2.19 (*)    All other components within normal limits  URINALYSIS, ROUTINE W REFLEX MICROSCOPIC (NOT AT [facility])    Imaging Review Dg Chest 1 View  12/08/2015  CLINICAL DATA:  Pt reports he tripped and fell in his living room today as he was walking into the house and landed on his right side;  obvious deformity to right leg; pt denies left hip pain; best obtainable images due to pt's condition. EXAM: CHEST 1 VIEW COMPARISON:  08/14/2011 FINDINGS: Lungs are hyperinflated. Heart is mildly enlarged. Aorta is tortuous and densely calcified. Moderate emphysematous changes are present. There is perihilar bronchitic change. There are no focal consolidations or pleural effusions. Bilateral mid lung zone atelectasis or scarring is noted. No pulmonary edema. No evidence for pneumothorax or acute fracture. IMPRESSION: 1. Emphysematous changes and bronchitic changes. 2. Atelectasis or scarring in the mid lung zones bilaterally. 3. No evidence for pneumothorax or acute fracture. Electronically Signed   By: Norva Pavlov M.D.   On: 11/24/2015 17:27   Dg Hips Bilat With Pelvis 3-4 Views  11/29/2015  CLINICAL DATA:  Fall with right hip pain EXAM: DG HIP (WITH OR WITHOUT PELVIS) 3-4V BILAT COMPARISON:  None. FINDINGS: There is an oblique subtrochanteric fracture in the proximal right femur, with prominent apex anterior angulation, 2.5 cm medial displacement of the distal fracture fragment and 6 cm over riding of the fracture fragments. No additional fracture is seen in  the pelvis or hips. No dislocation at the hip joints. No appreciable hip arthropathy. No pelvic diastasis. Diffuse osteopenia. No suspicious focal osseous lesions. IMPRESSION: Oblique, displaced, overriding and angulated subtrochanteric right proximal femur fracture. Diffuse osteopenia. Electronically Signed   By: Delbert Phenix M.D.   On: 12/02/2015 17:28   I have personally reviewed and evaluated these images and lab results as part of my medical decision-making.   EKG Interpretation   Date/Time:  Saturday December 03 2015 16:21:11 EDT Ventricular Rate:  82 PR Interval:  193 QRS Duration: 101 QT Interval:  406 QTC Calculation: 474 R Axis:   -79 Text Interpretation:  Sinus or ectopic atrial rhythm Abnormal R-wave  progression, early transition Inferior infarct, old Confirmed by Sumeet Geter   MD, Josejuan Hoaglin [phone]) on 11/26/2015 5:32:51 PM      MDM   Final diagnoses:  Closed right hip fracture, initial encounter (HCC)    Status post fall resulting in right hip fracture. Sub-trocar discussed with Dr. August Saucer orthopedics. Patient has medical problems including being on coagulation therapy for atrial fibrillation. EKG here does not show atrial fib. INR is less than 3. Discussed with hospitalist they will admit. Orthopedic sling about doing surgery later tonight or first thing tomorrow. Patient without any other injuries from the fall.    Vanetta Mulders, MD 11/24/2015 [phone]

## 2015-12-04 ENCOUNTER — Inpatient Hospital Stay (HOSPITAL_COMMUNITY): Payer: PPO | Admitting: Anesthesiology

## 2015-12-04 ENCOUNTER — Inpatient Hospital Stay (HOSPITAL_COMMUNITY): Payer: PPO

## 2015-12-04 ENCOUNTER — Encounter (HOSPITAL_COMMUNITY): Admission: EM | Disposition: E | Payer: Self-pay | Source: Home / Self Care | Attending: Internal Medicine

## 2015-12-04 ENCOUNTER — Encounter (HOSPITAL_COMMUNITY): Payer: Self-pay | Admitting: Critical Care Medicine

## 2015-12-04 DIAGNOSIS — J9621 Acute and chronic respiratory failure with hypoxia: Secondary | ICD-10-CM

## 2015-12-04 DIAGNOSIS — J449 Chronic obstructive pulmonary disease, unspecified: Secondary | ICD-10-CM

## 2015-12-04 HISTORY — PX: INTRAMEDULLARY (IM) NAIL INTERTROCHANTERIC: SHX5875

## 2015-12-04 LAB — BASIC METABOLIC PANEL
ANION GAP: 10 (ref 5–15)
BUN: 23 mg/dL — AB (ref 6–20)
CO2: 24 mmol/L (ref 22–32)
Calcium: 8.4 mg/dL — ABNORMAL LOW (ref 8.9–10.3)
Chloride: 110 mmol/L (ref 101–111)
Creatinine, Ser: 1.79 mg/dL — ABNORMAL HIGH (ref 0.61–1.24)
GFR calc Af Amer: 41 mL/min — ABNORMAL LOW (ref >60)
GFR, EST NON AFRICAN AMERICAN: 35 mL/min — AB (ref >60)
GLUCOSE: 125 mg/dL — AB (ref 65–99)
Potassium: 4.9 mmol/L (ref 3.5–5.1)
Sodium: 144 mmol/L (ref 135–145)

## 2015-12-04 LAB — CBC WITH DIFFERENTIAL/PLATELET
BASOS PCT: 0 %
Basophils Absolute: 0 10*3/uL (ref 0.0–0.1)
Eosinophils Absolute: 0 10*3/uL (ref 0.0–0.7)
Eosinophils Relative: 0 %
HEMATOCRIT: 35.5 % — AB (ref 39.0–52.0)
HEMOGLOBIN: 11.7 g/dL — AB (ref 13.0–17.0)
LYMPHS ABS: 1.3 10*3/uL (ref 0.7–4.0)
LYMPHS PCT: 9 %
MCH: 30 pg (ref 26.0–34.0)
MCHC: 33 g/dL (ref 30.0–36.0)
MCV: 91 fL (ref 78.0–100.0)
MONOS PCT: 7 %
Monocytes Absolute: 1 10*3/uL (ref 0.1–1.0)
NEUTROS ABS: 12 10*3/uL — AB (ref 1.7–7.7)
Neutrophils Relative %: 84 %
Platelets: 246 10*3/uL (ref 150–400)
RBC: 3.9 MIL/uL — ABNORMAL LOW (ref 4.22–5.81)
RDW: 15.8 % — AB (ref 11.5–15.5)
WBC: 14.3 10*3/uL — ABNORMAL HIGH (ref 4.0–10.5)

## 2015-12-04 LAB — SURGICAL PCR SCREEN
MRSA, PCR: NEGATIVE
Staphylococcus aureus: NEGATIVE

## 2015-12-04 LAB — URINE MICROSCOPIC-ADD ON

## 2015-12-04 LAB — URINALYSIS, ROUTINE W REFLEX MICROSCOPIC
BILIRUBIN URINE: NEGATIVE
Glucose, UA: NEGATIVE mg/dL
KETONES UR: NEGATIVE mg/dL
Leukocytes, UA: NEGATIVE
NITRITE: NEGATIVE
Protein, ur: NEGATIVE mg/dL
SPECIFIC GRAVITY, URINE: 1.019 (ref 1.005–1.030)
pH: 5 (ref 5.0–8.0)

## 2015-12-04 LAB — PROTIME-INR
INR: 2.11 — AB (ref 0.00–1.49)
Prothrombin Time: 23.5 seconds — ABNORMAL HIGH (ref 11.6–15.2)

## 2015-12-04 SURGERY — FIXATION, FRACTURE, INTERTROCHANTERIC, WITH INTRAMEDULLARY ROD
Anesthesia: General | Site: Hip | Laterality: Right

## 2015-12-04 MED ORDER — PHENYLEPHRINE 40 MCG/ML (10ML) SYRINGE FOR IV PUSH (FOR BLOOD PRESSURE SUPPORT)
PREFILLED_SYRINGE | INTRAVENOUS | Status: AC
  Filled 2015-12-04: qty 10

## 2015-12-04 MED ORDER — PROPOFOL 10 MG/ML IV BOLUS
INTRAVENOUS | Status: AC
  Filled 2015-12-04: qty 20

## 2015-12-04 MED ORDER — LIDOCAINE HCL (CARDIAC) 20 MG/ML IV SOLN
INTRAVENOUS | Status: AC
  Filled 2015-12-04: qty 5

## 2015-12-04 MED ORDER — 0.9 % SODIUM CHLORIDE (POUR BTL) OPTIME
TOPICAL | Status: DC | PRN
  Administered 2015-12-04: 3000 mL

## 2015-12-04 MED ORDER — TRANEXAMIC ACID 1000 MG/10ML IV SOLN
2000.0000 mg | INTRAVENOUS | Status: DC | PRN
  Administered 2015-12-04: 2000 mg via TOPICAL

## 2015-12-04 MED ORDER — ONDANSETRON HCL 4 MG PO TABS: 4.0000 mg | ORAL_TABLET | Freq: Four times a day (QID) | ORAL | Status: DC | PRN

## 2015-12-04 MED ORDER — ONDANSETRON HCL 4 MG/2ML IJ SOLN
4.0000 mg | Freq: Four times a day (QID) | INTRAMUSCULAR | Status: DC | PRN
  Filled 2015-12-04: qty 2

## 2015-12-04 MED ORDER — CETYLPYRIDINIUM CHLORIDE 0.05 % MT LIQD: 7.0000 mL | Freq: Two times a day (BID) | OROMUCOSAL | Status: DC

## 2015-12-04 MED ORDER — SODIUM CHLORIDE 0.9 % IN NEBU
INHALATION_SOLUTION | RESPIRATORY_TRACT | Status: AC
  Filled 2015-12-04: qty 3

## 2015-12-04 MED ORDER — CHLORHEXIDINE GLUCONATE 0.12 % MT SOLN
15.0000 mL | Freq: Two times a day (BID) | OROMUCOSAL | Status: DC
  Administered 2015-12-04 – 2015-12-05 (×2): 15 mL via OROMUCOSAL
  Filled 2015-12-04: qty 15

## 2015-12-04 MED ORDER — PHENYLEPHRINE HCL 10 MG/ML IJ SOLN
INTRAMUSCULAR | Status: DC | PRN
  Administered 2015-12-04 (×2): 120 ug via INTRAVENOUS
  Administered 2015-12-04 (×4): 80 ug via INTRAVENOUS
  Administered 2015-12-04: 120 ug via INTRAVENOUS
  Administered 2015-12-04 (×2): 80 ug via INTRAVENOUS
  Administered 2015-12-04 (×2): 120 ug via INTRAVENOUS

## 2015-12-04 MED ORDER — IPRATROPIUM-ALBUTEROL 0.5-2.5 (3) MG/3ML IN SOLN
3.0000 mL | Freq: Four times a day (QID) | RESPIRATORY_TRACT | Status: DC
  Administered 2015-12-04: 3 mL via RESPIRATORY_TRACT
  Filled 2015-12-04: qty 3

## 2015-12-04 MED ORDER — SODIUM CHLORIDE 0.9 % IV SOLN
INTRAVENOUS | Status: DC | PRN
  Administered 2015-12-04: 08:00:00 via INTRAVENOUS

## 2015-12-04 MED ORDER — WARFARIN SODIUM 5 MG PO TABS
5.0000 mg | ORAL_TABLET | Freq: Once | ORAL | Status: AC
  Administered 2015-12-04: 5 mg via ORAL
  Filled 2015-12-04: qty 1

## 2015-12-04 MED ORDER — SODIUM CHLORIDE 0.9 % IJ SOLN
INTRAMUSCULAR | Status: AC
  Filled 2015-12-04: qty 10

## 2015-12-04 MED ORDER — ROCURONIUM BROMIDE 50 MG/5ML IV SOLN
INTRAVENOUS | Status: AC
  Filled 2015-12-04: qty 1

## 2015-12-04 MED ORDER — NEOSTIGMINE METHYLSULFATE 10 MG/10ML IV SOLN
INTRAVENOUS | Status: DC | PRN
  Administered 2015-12-04: 4 mg via INTRAVENOUS

## 2015-12-04 MED ORDER — SUCCINYLCHOLINE CHLORIDE 20 MG/ML IJ SOLN
INTRAMUSCULAR | Status: AC
  Filled 2015-12-04: qty 1

## 2015-12-04 MED ORDER — ALBUTEROL SULFATE HFA 108 (90 BASE) MCG/ACT IN AERS
INHALATION_SPRAY | RESPIRATORY_TRACT | Status: DC | PRN
Start: 2015-12-04 — End: 2015-12-04
  Administered 2015-12-04: 2 via RESPIRATORY_TRACT

## 2015-12-04 MED ORDER — CETYLPYRIDINIUM CHLORIDE 0.05 % MT LIQD
7.0000 mL | Freq: Two times a day (BID) | OROMUCOSAL | Status: DC
  Administered 2015-12-05: 7 mL via OROMUCOSAL

## 2015-12-04 MED ORDER — ROCURONIUM BROMIDE 100 MG/10ML IV SOLN
INTRAVENOUS | Status: DC | PRN
  Administered 2015-12-04: 40 mg via INTRAVENOUS

## 2015-12-04 MED ORDER — OXYCODONE HCL 5 MG/5ML PO SOLN: 5.0000 mg | Freq: Once | ORAL | Status: DC | PRN

## 2015-12-04 MED ORDER — MORPHINE SULFATE (PF) 2 MG/ML IV SOLN
INTRAVENOUS | Status: AC
  Administered 2015-12-04: 0.5 mg via INTRAVENOUS
  Filled 2015-12-04: qty 1

## 2015-12-04 MED ORDER — ALBUTEROL SULFATE HFA 108 (90 BASE) MCG/ACT IN AERS
INHALATION_SPRAY | RESPIRATORY_TRACT | Status: AC
  Filled 2015-12-04: qty 13.4

## 2015-12-04 MED ORDER — MEPERIDINE HCL 25 MG/ML IJ SOLN: 6.2500 mg | INTRAMUSCULAR | Status: DC | PRN

## 2015-12-04 MED ORDER — WARFARIN - PHARMACIST DOSING INPATIENT
Freq: Every day | Status: DC
  Administered 2015-12-06 – 2015-12-08 (×3)
  Administered 2015-12-15: 1

## 2015-12-04 MED ORDER — MORPHINE SULFATE (PF) 2 MG/ML IV SOLN
0.5000 mg | INTRAVENOUS | Status: DC | PRN
  Administered 2015-12-04 (×3): 0.5 mg via INTRAVENOUS
  Filled 2015-12-04: qty 1

## 2015-12-04 MED ORDER — ALBUTEROL SULFATE (2.5 MG/3ML) 0.083% IN NEBU
INHALATION_SOLUTION | RESPIRATORY_TRACT | Status: AC
  Administered 2015-12-04: 2.5 mg via RESPIRATORY_TRACT
  Filled 2015-12-04: qty 3

## 2015-12-04 MED ORDER — HYDROCODONE-ACETAMINOPHEN 5-325 MG PO TABS
1.0000 | ORAL_TABLET | Freq: Four times a day (QID) | ORAL | Status: DC | PRN
  Administered 2015-12-04 – 2015-12-06 (×5): 2 via ORAL
  Filled 2015-12-04 (×5): qty 2

## 2015-12-04 MED ORDER — PHENOL 1.4 % MT LIQD: 1.0000 | OROMUCOSAL | Status: DC | PRN

## 2015-12-04 MED ORDER — SODIUM CHLORIDE 0.9 % IV SOLN
2000.0000 mg | Freq: Once | INTRAVENOUS | Status: DC
  Filled 2015-12-04: qty 20

## 2015-12-04 MED ORDER — FENTANYL CITRATE (PF) 250 MCG/5ML IJ SOLN
INTRAMUSCULAR | Status: AC
  Filled 2015-12-04: qty 5

## 2015-12-04 MED ORDER — GLYCOPYRROLATE 0.2 MG/ML IJ SOLN
INTRAMUSCULAR | Status: DC | PRN
  Administered 2015-12-04: 0.6 mg via INTRAVENOUS

## 2015-12-04 MED ORDER — ACETAMINOPHEN 650 MG RE SUPP
650.0000 mg | Freq: Four times a day (QID) | RECTAL | Status: DC | PRN
Start: 2015-12-04 — End: 2015-12-18
  Administered 2015-12-09: 650 mg via RECTAL
  Filled 2015-12-04: qty 1

## 2015-12-04 MED ORDER — ACETAMINOPHEN 325 MG PO TABS
650.0000 mg | ORAL_TABLET | Freq: Four times a day (QID) | ORAL | Status: DC | PRN
  Administered 2015-12-13: 650 mg via ORAL
  Filled 2015-12-04: qty 2

## 2015-12-04 MED ORDER — MIDAZOLAM HCL 2 MG/2ML IJ SOLN
INTRAMUSCULAR | Status: AC
  Filled 2015-12-04: qty 2

## 2015-12-04 MED ORDER — DEXAMETHASONE SODIUM PHOSPHATE 4 MG/ML IJ SOLN
INTRAMUSCULAR | Status: DC | PRN
  Administered 2015-12-04: 4 mg via INTRAVENOUS

## 2015-12-04 MED ORDER — METOCLOPRAMIDE HCL 5 MG PO TABS: 5.0000 mg | ORAL_TABLET | Freq: Three times a day (TID) | ORAL | Status: DC | PRN

## 2015-12-04 MED ORDER — EPHEDRINE SULFATE 50 MG/ML IJ SOLN
INTRAMUSCULAR | Status: AC
  Filled 2015-12-04: qty 1

## 2015-12-04 MED ORDER — MENTHOL 3 MG MT LOZG: 1.0000 | LOZENGE | OROMUCOSAL | Status: DC | PRN

## 2015-12-04 MED ORDER — METOCLOPRAMIDE HCL 5 MG/ML IJ SOLN: 5.0000 mg | Freq: Three times a day (TID) | INTRAMUSCULAR | Status: DC | PRN

## 2015-12-04 MED ORDER — OXYCODONE HCL 5 MG PO TABS: 5.0000 mg | ORAL_TABLET | Freq: Once | ORAL | Status: DC | PRN

## 2015-12-04 MED ORDER — CLINDAMYCIN PHOSPHATE 600 MG/50ML IV SOLN
600.0000 mg | Freq: Four times a day (QID) | INTRAVENOUS | Status: AC
  Administered 2015-12-04 (×2): 600 mg via INTRAVENOUS
  Filled 2015-12-04 (×2): qty 50

## 2015-12-04 MED ORDER — POTASSIUM CHLORIDE IN NACL 20-0.9 MEQ/L-% IV SOLN
INTRAVENOUS | Status: DC
  Administered 2015-12-04: 19:00:00 via INTRAVENOUS
  Filled 2015-12-04 (×3): qty 1000

## 2015-12-04 MED ORDER — PROPOFOL 10 MG/ML IV BOLUS
INTRAVENOUS | Status: DC | PRN
  Administered 2015-12-04: 70 mg via INTRAVENOUS
  Administered 2015-12-04: 10 mg via INTRAVENOUS

## 2015-12-04 MED ORDER — ALBUTEROL SULFATE (2.5 MG/3ML) 0.083% IN NEBU
INHALATION_SOLUTION | RESPIRATORY_TRACT | Status: AC
  Filled 2015-12-04: qty 3

## 2015-12-04 MED ORDER — FENTANYL CITRATE (PF) 100 MCG/2ML IJ SOLN
INTRAMUSCULAR | Status: DC | PRN
  Administered 2015-12-04 (×2): 50 ug via INTRAVENOUS
  Administered 2015-12-04 (×2): 25 ug via INTRAVENOUS
  Administered 2015-12-04: 50 ug via INTRAVENOUS

## 2015-12-04 MED ORDER — HYDROMORPHONE HCL 1 MG/ML IJ SOLN: 0.2500 mg | INTRAMUSCULAR | Status: DC | PRN

## 2015-12-04 MED ORDER — LIDOCAINE HCL (CARDIAC) 20 MG/ML IV SOLN
INTRAVENOUS | Status: DC | PRN
  Administered 2015-12-04: 40 mg via INTRAVENOUS

## 2015-12-04 MED ORDER — LACTATED RINGERS IV SOLN
INTRAVENOUS | Status: DC | PRN
  Administered 2015-12-04 (×2): via INTRAVENOUS

## 2015-12-04 SURGICAL SUPPLY — 68 items
BIT DRILL SHORT 4.0 (BIT) ×2 IMPLANT
BLADE SURG 15 STRL LF DISP TIS (BLADE) ×1 IMPLANT
BLADE SURG 15 STRL SS (BLADE) ×3
BLADE SURG ROTATE 9660 (MISCELLANEOUS) IMPLANT
CABLE 2.0 (Cable) ×4 IMPLANT
COVER MAYO STAND STRL (DRAPES) ×6 IMPLANT
COVER PERINEAL POST (MISCELLANEOUS) ×3 IMPLANT
COVER SURGICAL LIGHT HANDLE (MISCELLANEOUS) ×3 IMPLANT
DRAPE INCISE IOBAN 66X45 STRL (DRAPES) ×2 IMPLANT
DRAPE ORTHO SPLIT 77X108 STRL (DRAPES)
DRAPE PROXIMA HALF (DRAPES) IMPLANT
DRAPE STERI IOBAN 125X83 (DRAPES) ×3 IMPLANT
DRAPE SURG ORHT 6 SPLT 77X108 (DRAPES) IMPLANT
DRESSING AQUACEL AG SP 3.5X6 (GAUZE/BANDAGES/DRESSINGS) ×1 IMPLANT
DRILL BIT SHORT 4.0 (BIT) ×6
DRSG AQUACEL AG SP 3.5X6 (GAUZE/BANDAGES/DRESSINGS) ×3
DRSG MEPILEX BORDER 4X4 (GAUZE/BANDAGES/DRESSINGS) ×4 IMPLANT
DRSG MEPILEX BORDER 4X8 (GAUZE/BANDAGES/DRESSINGS) IMPLANT
DURAPREP 26ML APPLICATOR (WOUND CARE) ×5 IMPLANT
ELECT CAUTERY BLADE 6.4 (BLADE) ×2 IMPLANT
ELECT REM PT RETURN 9FT ADLT (ELECTROSURGICAL) ×3
ELECTRODE REM PT RTRN 9FT ADLT (ELECTROSURGICAL) ×1 IMPLANT
FACESHIELD WRAPAROUND (MASK) IMPLANT
FACESHIELD WRAPAROUND OR TEAM (MASK) ×1 IMPLANT
GAUZE XEROFORM 5X9 LF (GAUZE/BANDAGES/DRESSINGS) ×3 IMPLANT
GLOVE BIOGEL PI IND STRL 7.0 (GLOVE) IMPLANT
GLOVE BIOGEL PI IND STRL 8 (GLOVE) ×1 IMPLANT
GLOVE BIOGEL PI INDICATOR 7.0 (GLOVE) ×2
GLOVE BIOGEL PI INDICATOR 8 (GLOVE) ×6
GLOVE ORTHO TXT STRL SZ7.5 (GLOVE) ×6 IMPLANT
GLOVE SURG ORTHO 8.0 STRL STRW (GLOVE) ×3 IMPLANT
GLOVE SURG SS PI 7.0 STRL IVOR (GLOVE) ×2 IMPLANT
GOWN STRL REUS W/ TWL LRG LVL3 (GOWN DISPOSABLE) ×1 IMPLANT
GOWN STRL REUS W/ TWL XL LVL3 (GOWN DISPOSABLE) IMPLANT
GOWN STRL REUS W/TWL LRG LVL3 (GOWN DISPOSABLE) ×3
GOWN STRL REUS W/TWL XL LVL3 (GOWN DISPOSABLE) ×6
GUIDE PIN 3.2X343 (PIN) ×1
GUIDE PIN 3.2X343MM (PIN) ×3
GUIDE ROD 3.0 (MISCELLANEOUS) ×3
KIT BASIN OR (CUSTOM PROCEDURE TRAY) ×3 IMPLANT
KIT ROOM TURNOVER OR (KITS) ×3 IMPLANT
LINER BOOT UNIVERSAL DISP (MISCELLANEOUS) ×3 IMPLANT
MANIFOLD NEPTUNE II (INSTRUMENTS) ×3 IMPLANT
NAIL TRIGEN 10MMX36CM-125 RT (Nail) ×2 IMPLANT
NS IRRIG 1000ML POUR BTL (IV SOLUTION) ×5 IMPLANT
PACK GENERAL/GYN (CUSTOM PROCEDURE TRAY) ×3 IMPLANT
PAD ARMBOARD 7.5X6 YLW CONV (MISCELLANEOUS) ×8 IMPLANT
PIN GUIDE 3.2X343MM (PIN) IMPLANT
ROD GUIDE 3.0 (MISCELLANEOUS) IMPLANT
SCREW LAG COMBO 90.85 (Screw) ×1 IMPLANT
SCREW LAG COMPR KIT 90/85 (Screw) ×1 IMPLANT
SCREW TRIGEN LOW PROF 5.0X32.5 (Screw) ×2 IMPLANT
SCREW TRIGEN LOW PROF 5.0X37.5 (Screw) ×2 IMPLANT
SPONGE LAP 4X18 X RAY DECT (DISPOSABLE) IMPLANT
STAPLER VISISTAT 35W (STAPLE) ×3 IMPLANT
SUT ETHILON 2 0 FS 18 (SUTURE) IMPLANT
SUT ETHILON 3 0 FSL (SUTURE) ×6 IMPLANT
SUT VIC AB 0 CT1 27 (SUTURE) ×6
SUT VIC AB 0 CT1 27XBRD ANBCTR (SUTURE) ×2 IMPLANT
SUT VIC AB 1 CT1 27 (SUTURE) ×12
SUT VIC AB 1 CT1 27XBRD ANBCTR (SUTURE) IMPLANT
SUT VIC AB 2-0 CT1 27 (SUTURE) ×9
SUT VIC AB 2-0 CT1 TAPERPNT 27 (SUTURE) IMPLANT
SUT VIC AB 2-0 CTB1 (SUTURE) ×3 IMPLANT
TAPE STRIPS DRAPE STRL (GAUZE/BANDAGES/DRESSINGS) IMPLANT
TOWEL OR 17X24 6PK STRL BLUE (TOWEL DISPOSABLE) ×3 IMPLANT
TOWEL OR 17X26 10 PK STRL BLUE (TOWEL DISPOSABLE) ×3 IMPLANT
WATER STERILE IRR 1000ML POUR (IV SOLUTION) ×3 IMPLANT

## 2015-12-04 NOTE — Anesthesia Preprocedure Evaluation (Addendum)
Anesthesia Evaluation  Patient identified by MRN, date of birth, ID band Patient awake    Reviewed: Allergy & Precautions, NPO status , Patient's Chart, lab work & pertinent test results  Airway Mallampati: I  TM Distance: >3 FB Neck ROM: Full    Dental  (+) Teeth Intact, Dental Advisory Given   Pulmonary asthma , COPD,  COPD inhaler, former smoker,    breath sounds clear to auscultation       Cardiovascular hypertension, Pt. on medications  Rhythm:Regular Rate:Normal     Neuro/Psych    GI/Hepatic   Endo/Other    Renal/GU      Musculoskeletal   Abdominal   Peds  Hematology   Anesthesia Other Findings   Reproductive/Obstetrics                           Anesthesia Physical Anesthesia Plan  ASA: III  Anesthesia Plan: General   Post-op Pain Management:    Induction: Intravenous  Airway Management Planned: Oral ETT  Additional Equipment:   Intra-op Plan:   Post-operative Plan: Extubation in OR and Possible Post-op intubation/ventilation  Informed Consent: I have reviewed the patients History and Physical, chart, labs and discussed the procedure including the risks, benefits and alternatives for the proposed anesthesia with the patient or authorized representative who has indicated his/her understanding and acceptance.   Dental advisory given  Plan Discussed with: CRNA, Anesthesiologist and Surgeon  Anesthesia Plan Comments:        Anesthesia Quick Evaluation

## 2015-12-04 NOTE — Progress Notes (Signed)
ANTICOAGULATION CONSULT NOTE - Initial Consult  Pharmacy Consult for warfarin Indication: VTE prophylaxis  Allergies  Allergen Reactions  . Penicillins Other (See Comments)    REACTION: feels drunk    Patient Measurements: Height: 5\' 4"  (162.6 cm) Weight: 121 lb 14.6 oz (55.3 kg) IBW/kg (Calculated) : 59.2   Vital Signs: Temp: 98.3 F (36.8 C) (03/19 1700) Temp Source: Oral (03/19 0530) BP: 113/67 mmHg (03/19 1700) Pulse Rate: 95 (03/19 1500)  Labs:  Recent Labs  04/07/2016 1622 11/27/2015 0446  HGB 13.0 11.7*  HCT 39.2 35.5*  PLT 241 246  LABPROT 24.2* 23.5*  INR 2.19* 2.11*  CREATININE 1.83* 1.79*    Estimated Creatinine Clearance: 27.9 mL/min (by C-G formula based on Cr of 1.79).   Assessment: 74 YOM s/p R femoral nailing for subtrochanteric fracture to start warfarin for VTE prophylaxis. He is on warfarin PTA for AFib. Home dose is 2.5mg  daily except 5mg  on Mondays and Fridays. Last dose taken was 3/17. INR this morning was therapeutic at 2.11. He was given vitamin K 1mg  IV x1 last evening.  HGb 11.7, plts 246. No excessive bleeding noted intra-op.  Goal of Therapy:  INR 2-3 Monitor platelets by anticoagulation protocol: Yes   Plan:  -warfarin 5mg  po x1 tonight -daily INR -can likely resume home dose of warfarin tomorrow -follow for s/s bleeding  Stevana Dufner D. Mahlik Lenn, PharmD, BCPS Clinical Pharmacist Pager: [phone] 12/03/2015 5:32 PM

## 2015-12-04 NOTE — Progress Notes (Signed)
PT Cancellation Note  Patient Details Name: Stephen [name] MRN: [id number] DOB: Jun 26, 1941   Cancelled Treatment:    Reason Eval/Treat Not Completed: Patient not medically ready    Will follow up for PT tomorrow;   Thank you,  Van ClinesHolly Magali Bray, PT  Acute Rehabilitation Services Pager [phone] Office [phone]     Van ClinesGarrigan, Dontrail Blackwell [facility] Centeramff 12/03/2015, 8:48 AM

## 2015-12-04 NOTE — Progress Notes (Signed)
Dr. Sampson GoonFitzgerald in, pt resting more comfortably but still with prolonged expiratory phase and requiring facemask O2 therapy at 10 to maintain sats.  Will transfer to step down for observation today.

## 2015-12-04 NOTE — Anesthesia Procedure Notes (Signed)
Procedure Name: Intubation Date/Time: 05/26/2016 8:27 AM Performed by: Glo HerringLEE, Irvin Lizama B Pre-anesthesia Checklist: Patient identified, Emergency Drugs available, Suction available, Patient being monitored and Timeout performed Patient Re-evaluated:Patient Re-evaluated prior to inductionOxygen Delivery Method: Circle system utilized Preoxygenation: Pre-oxygenation with 100% oxygen Intubation Type: IV induction Ventilation: Mask ventilation without difficulty Laryngoscope Size: Mac and 4 Grade View: Grade I Tube type: Subglottic suction tube Tube size: 7.5 mm Placement Confirmation: breath sounds checked- equal and bilateral,  CO2 detector,  positive ETCO2 and ETT inserted through vocal cords under direct vision Secured at: 23 cm Tube secured with: Tape Dental Injury: Teeth and Oropharynx as per pre-operative assessment

## 2015-12-04 NOTE — Progress Notes (Signed)
Has not voided since fall, over 16 hours ago.  No urge to urinate.  Unable to obtain bladder scan due to patients position with the disfigured hip placement.  He is due to go to surgery this morning.  Order obtained to place a foley catheter.  Patient is unhappy about it but agrees that it is neccessary.  Family is in the room and agrees with decision.

## 2015-12-04 NOTE — Plan of Care (Signed)
Problem: Nutrition: Goal: Adequate nutrition will be maintained Outcome: Not Progressing Unable to address at this time due to COPD exacerbation

## 2015-12-04 NOTE — Anesthesia Postprocedure Evaluation (Signed)
Anesthesia Post Note  Patient: Stephen [name]  Procedure(s) Performed: Procedure(s) (LRB): INTRAMEDULLARY (IM) NAIL INTERTROCHANTRIC (Right)  Patient location during evaluation: PACU Anesthesia Type: General Level of consciousness: awake and alert Pain management: pain level controlled Vital Signs Assessment: post-procedure vital signs reviewed and stable Respiratory status: spontaneous breathing, nonlabored ventilation, respiratory function stable and patient connected to face mask oxygen Cardiovascular status: blood pressure returned to baseline and stable Postop Assessment: no signs of nausea or vomiting Anesthetic complications: no    Last Vitals:  Filed Vitals:   12/05/2015 1300 11/21/2015 1315  BP:  123/66  Pulse: 88 88  Temp:  36.4 C  Resp: 15 18    Last Pain:  Filed Vitals:   11/26/2015 1318  PainSc: Asleep                 Zayvian Mcmurtry,W. EDMOND

## 2015-12-04 NOTE — Op Note (Signed)
NAME:  [name], [name] NO.:  [id number]  MEDICAL RECORD NO.:  [id number]  LOCATION:  [facility]                         FACILITY:  [facility]  PHYSICIAN:  Burnard Bunting, M.D.    DATE OF BIRTH:  [birth year]  DATE OF PROCEDURE:  12/05/2015 DATE OF DISCHARGE:                              OPERATIVE REPORT   PREOPERATIVE DIAGNOSIS:  Right subtrochanteric femur fracture.  POSTOPERATIVE DIAGNOSIS:  Right subtrochanteric femur fracture.  PROCEDURE:  Right subtrochanteric femur fracture, open reduction and internal fixation.  SURGEON:  Burnard Bunting, M.D.  ASSISTANT:  __________.  INDICATIONS:  Stephen [name] is a 75 year old patient with right proximal femur fracture presents for operative management after explanation of risks and benefits.  PROCEDURE IN DETAIL:  The patient was brought to the operating room where general anesthetic was induced.  Preop antibiotics were administered.  Time-out was called.  The patient was placed on the fracture bed with the left leg in the lithotomy position and peroneal nerve well padded.  Right leg was placed in traction and some internal and external rotation good reduction was achieved.  The patient was brought back out to length.  The area was prescrubbed with alcohol and Betadine.  allowed to air dry, prepped with DuraPrep solution and draped in a sterile manner.  Stephen [name] was used to cover the entire operative field.  Time-out was called.  An initial approach to the fracture was localized in the fracture site.  Skin and subcutaneous tissue were sharply divided over the distal portion of the subtrochanteric femur fracture extending proximally.  The tensor fascia lata was incised.  The vastus lateralis elevated anteriorly and the fracture was visualized. Two cables were placed taking care to avoid injuring the sciatic nerve and the femoral vessels.  The cables were placed and tightened and this gave good reduction.  At this time, a guidepin was placed  through a proximal incision about a handbreadth proximal to the trochanter. Correct placements confirmed the AP and lateral planes under fluoroscopy.  Guidepin was then placed, then with anterior pressure placed on the femoral neck. At this time, reaming was performed up to 11.5 mm.  The debriding was performed with a downward force on the femoral neck using a broad Cobb.  The nail was then placed.  __________ transfixing screws were then placed in the center-center portion of the neck.  This gave good reduction proximally.  Two distal interlocking screws were then placed.  Good reduction was achieved and confirmed under fluoroscopy in the AP and lateral planes.  Some small fragmentation was noted at the distal medial aspect of the distal interlock but this was not a continuous fracture when examined under fluoroscopy.  At this time, thorough irrigation was performed with all incisions.  Large incision for the fracture reduction was irrigated and closed using #1 Vicryl suture reapproximating the fascia lata followed by interrupted inverted 0 Vicryl sutures, 2-0 sutures, and staples. Proximal incision was closed using #1 Vicryl suture followed by 0 Vicryl suture, 2-0 suture, and nylon sutures.  Incision for the interlocking screws closed using 0 Vicryl suture, 2-0 Vicryl suture and nylon. Distal interlock incisions were irrigated and closed using 2-0 Vicryl and 3-0  nylon.  At this time, impervious dressings were placed.  The patient was then transferred to recovery room in stable condition, and tolerated the procedure well without immediate complication.     Burnard BuntingG. Scott Mersadie [name], M.D.     GSD/MEDQ  D:  11/27/2015  T:  12/08/2015  Job:  [id number]

## 2015-12-04 NOTE — Progress Notes (Signed)
Pt for surgery inr 2.1 before 1 unit ffp - did get vit k last pm i mg iv Plan 2nd unit ffp  Proceed today

## 2015-12-04 NOTE — Transfer of Care (Signed)
Immediate Anesthesia Transfer of Care Note  Patient: Stephen [name]  Procedure(s) Performed: Procedure(s): INTRAMEDULLARY (IM) NAIL INTERTROCHANTRIC (Right)  Patient Location: PACU  Anesthesia Type:General  Level of Consciousness: awake, alert  and oriented  Airway & Oxygen Therapy: Patient Spontanous Breathing and Patient connected to face mask oxygen  Post-op Assessment: Report given to RN and Post -op Vital signs reviewed and stable  Post vital signs: Reviewed and stable  Last Vitals:  Filed Vitals:   12/11/2015 0500 12/03/2015 0530  BP: 135/75 136/82  Pulse: 92 94  Temp: 37.3 C 37.1 C  Resp: 20 16    Complications: No apparent anesthesia complications

## 2015-12-04 NOTE — Consult Note (Signed)
PULMONARY / CRITICAL CARE MEDICINE   Name: Stephen [name] MRN: [id number] DOB: Apr 18, 1941    ADMISSION DATE:  12/08/2015 CONSULTATION DATE:  12/12/2015  REFERRING MD:  Dr August Saucer, Ortho  CHIEF COMPLAINT:  Respiratory Distress  HISTORY OF PRESENT ILLNESS:   Senna 75-year-old man with severe COPD, prednisone and oxygen dependent, age fibrillation on Coumadin, hypertension. He was admitted on 11/19/2015 with a right hip fracture after mechanical fall.  He went to the operating room on 3/19 for hip repair. In the PACU he was activated with some difficulty but then continued to have significant increased work of breathing. PCCM asked to evaluate.  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  PAST SURGICAL HISTORY: He  has past surgical history that includes Tonsillectomy and Colonoscopy.  Allergies  Allergen Reactions  . Penicillins Other (See Comments)    REACTION: feels drunk    No current facility-administered medications on file prior to encounter.   Current Outpatient Prescriptions on File Prior to Encounter  Medication Sig  . ADVAIR DISKUS 250-50 MCG/DOSE AEPB inhale 1 dose by mouth twice a day  . albuterol (PROVENTIL HFA;VENTOLIN HFA) 108 (90 BASE) MCG/ACT inhaler Inhale 2 puffs into the lungs every 6 (six) hours as needed. For shortness of breath  . AMBULATORY NON FORMULARY MEDICATION Inhale 2 L into the lungs continuous. Medication Name: oxygen 2 liters  . diltiazem (DILACOR XR) 120 MG 24 hr capsule Take 1 capsule (120 mg total) by mouth daily.  Marland Kitchen levalbuterol (XOPENEX) 1.25 MG/3ML nebulizer solution inhale contents  of 1 vial in nebulizer every 6 hours (Patient taking differently: inhale contents of 1 vial in nebulizer every 6 hours as needed for SOB)  . levothyroxine (SYNTHROID, LEVOTHROID) 75 MCG tablet take 1 tablet by mouth every morning ON AN EMPTY STOMACH  . meclizine (ANTIVERT) 25 MG tablet Take 1 tablet (25 mg total) by mouth 4 (four) times daily as needed for dizziness.  . Multiple Vitamin (MULTIVITAMIN) tablet Take 1 tablet by mouth daily.    Marland Kitchen PACERONE 200 MG tablet take 1 tablet by mouth once daily  . polyethylene glycol (MIRALAX / GLYCOLAX) packet Take 17 g by mouth daily as needed. For constipation  . SPIRIVA HANDIHALER 18 MCG inhalation capsule inhale contents of 1 capsule by mouth once daily  . tamsulosin (FLOMAX) 0.4 MG CAPS capsule take 1 capsule by mouth twice a day  . warfarin (COUMADIN) 2.5 MG tablet Take as directed by anticoagulation clinic (Patient taking differently: Take 2.5-5 mg by mouth daily at 6 PM. Takes  on Mon and Fri  Takes 2.5mg  all other days)  . doxycycline (VIBRAMYCIN) 100 MG capsule Take 1 capsule (100 mg total) by mouth 2 (two) times daily.  . predniSONE (DELTASONE) 10 MG tablet Take 2 tablets (20 mg total) by mouth 2 (two) times daily with a meal.    FAMILY HISTORY:  His has no family status information on file.   SOCIAL HISTORY: He  reports that he quit smoking about 32 years ago. He has never used smokeless tobacco. He reports that he does not drink alcohol or use illicit drugs.  REVIEW OF SYSTEMS:   Right hip discomfort Shortness of breath Slight nasal congestion with mucus Denies chest pain or  wheezing  SUBJECTIVE:  Analogies that is more short of breath than his baseline. Believes that he will get some relief if he could sit straight  VITAL SIGNS: BP 113/67 mmHg  Pulse 95  Temp(Src) 98.3 F (36.8 C) (Oral)  Resp 27  Ht 5\' 4"  (1.626 m)  Wt 55.3 kg (121 lb 14.6 oz)  BMI 20.92 kg/m2  SpO2 92%  HEMODYNAMICS:    VENTILATOR SETTINGS:     INTAKE / OUTPUT: I/O last 3 completed shifts: In: 1290 [I.V.:985; Blood:305] Out: -   PHYSICAL EXAMINATION: General:  Cachectic man in moderate respiratory distress Neuro:  Awake alert and interactive, oriented, nonfocal exam HEENT:  OP moist, nasal congestion and some difficulty moving air through his nose Cardiovascular:  Regular, no murmur Lungs:  Significant increase in work of breathing with accessory muscle use, no wheezing Abdomen:  Soft nontender positive bowel sounds Musculoskeletal:  Right lower extremity painful to touch, no significant edema Skin:  No rash  LABS:  BMET  Recent Labs Lab 12/01/2015 1622 2016-05-31 0446  NA 142 144  K 4.9 4.9  CL 108 110  CO2 21* 24  BUN 23* 23*  CREATININE 1.83* 1.79*  GLUCOSE 126* 125*    Electrolytes  Recent Labs Lab 12/02/2015 1622 2016-05-31 0446  CALCIUM 8.6* 8.4*    CBC  Recent Labs Lab 12/12/2015 1622 2016-05-31 0446  WBC 10.4 14.3*  HGB 13.0 11.7*  HCT 39.2 35.5*  PLT 241 246    Coag's  Recent Labs Lab 12/10/2015 1622 2016-05-31 0446  INR 2.19* 2.11*    Sepsis Markers No results for input(s): LATICACIDVEN, PROCALCITON, O2SATVEN in the last 168 hours.  ABG No results for input(s): PHART, PCO2ART, PO2ART in the last 168 hours.  Liver Enzymes  Recent Labs Lab 11/20/2015 1622  AST 22  ALT 20  ALKPHOS 84  BILITOT 0.6  ALBUMIN 3.3*    Cardiac Enzymes No results for input(s): TROPONINI, PROBNP in the last 168 hours.  Glucose No results for input(s): GLUCAP in the last 168 hours.  Imaging Pelvis Portable  09/11/16  CLINICAL DATA:  Operative fixation of a hip fracture. EXAM: PORTABLE PELVIS 1-2 VIEWS COMPARISON:  Previous examinations yesterday and earlier today. FINDINGS: The screw, rod and cerclage wire fixation of the previously demonstrated right femoral subtrochanteric fracture. Anatomic position and alignment of the fragments. There is a mildly comminuted fracture through the medial cortex of  the distal femur at the location of the distal fixation screw. Postoperative air is noted as well as atheromatous arterial calcifications. IMPRESSION: 1. Mildly comminuted fracture through the medial cortex of the distal femur at the location of the distal fixation screw. 2. Hardware fixation of the previously demonstrated subtrochanteric fracture with anatomic position and alignment. These results will be called to the ordering clinician or representative by the Radiologist Assistant, and communication documented in the PACS or zVision Dashboard. Electronically Signed   By: Beckie SaltsSteven  Reid M.D.   On: 012/26/17 14:09   Dg Abd Portable 1v  12/02/2015  CLINICAL DATA:  Assess for small bowel obstruction. Initial encounter. EXAM: PORTABLE ABDOMEN - 1 VIEW COMPARISON:  MRI of the lumbar spine performed 05/23/2011 FINDINGS: The stomach is partially filled with air. The colon is partially filled with stool. No free intra-abdominal air is seen, though evaluation for free air is limited on a single supine view. No free intra-abdominal air is seen, though evaluation for free air is limited on a single supine view. No acute osseous abnormalities are seen. The  visualized lung bases are grossly clear. IMPRESSION: Unremarkable bowel gas pattern; no free intra-abdominal air seen. Moderate amount of stool noted in the colon. Stomach partially filled with air. Electronically Signed   By: Roanna Raider M.D.   On: 12/14/2015 23:03   Dg C-arm 61-120 Min  12/14/2015  CLINICAL DATA:  Operative fixation of a subtrochanteric proximal right femur fracture. EXAM: RIGHT FEMUR 2 VIEWS; DG C-ARM 61-120 MIN COMPARISON:  Right hip radiographs obtained yesterday. FINDINGS: Seven C-arm views of the right hip and femur demonstrate compression screw and rod fixation of the previously demonstrated subtrochanteric right femur fracture. Cerclage wires are also in place. Anatomic position and alignment. IMPRESSION: Anatomic position and alignment of  the proximal right femur fracture following hardware fixation. Electronically Signed   By: Beckie Salts M.D.   On: 12/10/2015 11:19   Dg Femur, Min 2 Views Right  11/16/2015  CLINICAL DATA:  Operative fixation of a subtrochanteric proximal right femur fracture. EXAM: RIGHT FEMUR 2 VIEWS; DG C-ARM 61-120 MIN COMPARISON:  Right hip radiographs obtained yesterday. FINDINGS: Seven C-arm views of the right hip and femur demonstrate compression screw and rod fixation of the previously demonstrated subtrochanteric right femur fracture. Cerclage wires are also in place. Anatomic position and alignment. IMPRESSION: Anatomic position and alignment of the proximal right femur fracture following hardware fixation. Electronically Signed   By: Beckie Salts M.D.   On: 11/16/2015 11:19     STUDIES:   CULTURES:  ANTIBIOTICS: Clindamycin post procedure  SIGNIFICANT EVENTS: Right hip repair 3/19  LINES/TUBES:  DISCUSSION: 75 year old man with severe COPD with acute on chronic respiratory failure following right hip surgery  ASSESSMENT / PLAN:  PULMONARY A: Acute on chronic respiratory failure without any current wheezing P:   Change bronchodilators to DuoNeb Continue his maintenance prednisone dosing Continue oxygen as needed to keep saturations greater than 90% Order BiPAP to be used when necessary for increased work of breathing  CARDIOVASCULAR A:  HTN Atrial fibrillation P:  Hold antihypertensive regimen while on narcotics for pain control Restart Coumadin when okay with orthopedic surgery Continue amiodarone  RENAL A:   Chronic renal insufficiency P:   Follow BMP Follow [facility]  GASTROINTESTINAL A:   Umbilical hernia P:   Planned follow-up with [facility] as an outpatient  HEMATOLOGIC A:   Chronic anticoagulation P:  Resume Coumadin when okay with orthopedic surgery  INFECTIOUS A:   No evidence of active infection P:   Prophylactic clindamycin  ENDOCRINE A:   hypothyroidism    P:   Continue Synthroid  NEUROLOGIC A:   Pain control post hip surgery P:   RASS goal: 0 Pain control as ordererd   FAMILY  - Updates: none present  - Inter-disciplinary family meet or Palliative Care meeting due by:  3/25   Independent CC time 40 minutes   Levy Pupa, MD, PhD 12/16/2015, 6:08 PM [facility] [phone] or if no answer [phone]

## 2015-12-04 NOTE — Progress Notes (Signed)
Dr. Sampson GoonFitzgerald at bedside. Pt tolerating 2.5L per Nasal cannula .Pt has HOB as high as can be tolerated.  Severe SOB with any exertion or trying to converse with staff. Chest clear. No wheezing at present. Discussed sending pt to ICU. Dr. August Saucerean updated . Plan to send Mr. Stephen [name] to ICU

## 2015-12-04 NOTE — Progress Notes (Addendum)
Dr. August Saucerean in OR. CCM paged to OR 6 at Dr. Diamantina Providenceean's request

## 2015-12-04 NOTE — Plan of Care (Signed)
Problem: Respiratory: Goal: Ability to achieve and maintain a regular respiratory rate will improve Outcome: Progressing Some improvement on Bipap Goal: Ability to maintain a clear airway will improve Outcome: Progressing Monitoring patient closely Goal: Ability to maintain normal oxygenation or baseline will improve Outcome: Not Progressing Pt still needs support of bipap

## 2015-12-04 NOTE — Brief Op Note (Signed)
12/15/2015 - December 15, 2015  11:18 AM  PATIENT:  Stephen [name]  75 y.o. male  PRE-OPERATIVE DIAGNOSIS:  RIGHT FEMORAL SUBTROCHANTERIC FRACTURE  POST-OPERATIVE DIAGNOSIS:  RIGHT FEMORAL SUBTROCHANTERIC   PROCEDURE:  Procedure(s): INTRAMEDULLARY (IM) NAIL INTERTROCHANTRIC  SURGEON:  Surgeon(s): Cammy CopaScott Davida Falconi, MD  ASSISTANT: Zonia KiefJames Owens PA  ANESTHESIA:   general  EBL: 300 ml    Total I/O In: 1865 [I.V.:1650; Blood:215] Out: 325 [Urine:75; Blood:250]  BLOOD ADMINISTERED: none  DRAINS: none   LOCAL MEDICATIONS USED:  none  SPECIMEN:  No Specimen  COUNTS:  YES  TOURNIQUET:  * No tourniquets in log *  DICTATION: .Other Dictation: Dictation Number [id number]  PLAN OF CARE: Admit to inpatient   PATIENT DISPOSITION:  PACU - hemodynamically stable

## 2015-12-04 NOTE — Progress Notes (Signed)
Dr Sampson GoonFitzgerald notified of patients continued respiratory difficulty in to see patient

## 2015-12-05 ENCOUNTER — Encounter (HOSPITAL_COMMUNITY): Payer: Self-pay | Admitting: Orthopedic Surgery

## 2015-12-05 DIAGNOSIS — I4891 Unspecified atrial fibrillation: Secondary | ICD-10-CM

## 2015-12-05 DIAGNOSIS — I10 Essential (primary) hypertension: Secondary | ICD-10-CM

## 2015-12-05 DIAGNOSIS — J441 Chronic obstructive pulmonary disease with (acute) exacerbation: Secondary | ICD-10-CM

## 2015-12-05 LAB — PREPARE FRESH FROZEN PLASMA
UNIT DIVISION: 0
Unit division: 0

## 2015-12-05 LAB — CBC
HCT: 27.2 % — ABNORMAL LOW (ref 39.0–52.0)
Hemoglobin: 8.6 g/dL — ABNORMAL LOW (ref 13.0–17.0)
MCH: 29.8 pg (ref 26.0–34.0)
MCHC: 31.6 g/dL (ref 30.0–36.0)
MCV: 94.1 fL (ref 78.0–100.0)
Platelets: 188 10*3/uL (ref 150–400)
RBC: 2.89 MIL/uL — ABNORMAL LOW (ref 4.22–5.81)
RDW: 15.9 % — ABNORMAL HIGH (ref 11.5–15.5)
WBC: 15.1 10*3/uL — ABNORMAL HIGH (ref 4.0–10.5)

## 2015-12-05 LAB — BASIC METABOLIC PANEL
ANION GAP: 6 (ref 5–15)
BUN: 25 mg/dL — ABNORMAL HIGH (ref 6–20)
CHLORIDE: 111 mmol/L (ref 101–111)
CO2: 25 mmol/L (ref 22–32)
Calcium: 7.5 mg/dL — ABNORMAL LOW (ref 8.9–10.3)
Creatinine, Ser: 1.79 mg/dL — ABNORMAL HIGH (ref 0.61–1.24)
GFR calc Af Amer: 41 mL/min — ABNORMAL LOW (ref >60)
GFR calc non Af Amer: 35 mL/min — ABNORMAL LOW (ref >60)
GLUCOSE: 118 mg/dL — AB (ref 65–99)
POTASSIUM: 4.6 mmol/L (ref 3.5–5.1)
Sodium: 142 mmol/L (ref 135–145)

## 2015-12-05 LAB — PROTIME-INR
INR: 1.77 — ABNORMAL HIGH (ref 0.00–1.49)
Prothrombin Time: 20.6 seconds — ABNORMAL HIGH (ref 11.6–15.2)

## 2015-12-05 MED ORDER — LEVALBUTEROL HCL 0.63 MG/3ML IN NEBU
0.6300 mg | INHALATION_SOLUTION | Freq: Four times a day (QID) | RESPIRATORY_TRACT | Status: DC | PRN
  Administered 2015-12-05 – 2015-12-09 (×3): 0.63 mg via RESPIRATORY_TRACT
  Filled 2015-12-05 (×3): qty 3

## 2015-12-05 MED ORDER — MOMETASONE FURO-FORMOTEROL FUM 200-5 MCG/ACT IN AERO
2.0000 | INHALATION_SPRAY | Freq: Two times a day (BID) | RESPIRATORY_TRACT | Status: DC
  Administered 2015-12-05 – 2015-12-08 (×6): 2 via RESPIRATORY_TRACT
  Filled 2015-12-05 (×2): qty 8.8

## 2015-12-05 MED ORDER — LEVALBUTEROL HCL 1.25 MG/0.5ML IN NEBU
1.2500 mg | INHALATION_SOLUTION | Freq: Three times a day (TID) | RESPIRATORY_TRACT | Status: DC
  Administered 2015-12-05 – 2015-12-08 (×12): 1.25 mg via RESPIRATORY_TRACT
  Filled 2015-12-05 (×13): qty 0.5

## 2015-12-05 MED ORDER — PANTOPRAZOLE SODIUM 40 MG PO TBEC
40.0000 mg | DELAYED_RELEASE_TABLET | Freq: Every day | ORAL | Status: DC
  Administered 2015-12-05 – 2015-12-09 (×4): 40 mg via ORAL
  Filled 2015-12-05 (×4): qty 1

## 2015-12-05 MED ORDER — WARFARIN SODIUM 5 MG PO TABS
5.0000 mg | ORAL_TABLET | Freq: Once | ORAL | Status: AC
  Administered 2015-12-05: 5 mg via ORAL
  Filled 2015-12-05: qty 1

## 2015-12-05 MED ORDER — PREDNISONE 50 MG PO TABS
50.0000 mg | ORAL_TABLET | Freq: Every day | ORAL | Status: DC
  Administered 2015-12-05 – 2015-12-08 (×4): 50 mg via ORAL
  Filled 2015-12-05 (×2): qty 1
  Filled 2015-12-05: qty 5
  Filled 2015-12-05: qty 1

## 2015-12-05 NOTE — Progress Notes (Signed)
ANTICOAGULATION CONSULT NOTE - Initial Consult  Pharmacy Consult for warfarin Indication: afib  Allergies  Allergen Reactions  . Penicillins Other (See Comments)    REACTION: feels drunk    Patient Measurements: Height: 5\' 4"  (162.6 cm) Weight: 121 lb 14.6 oz (55.3 kg) IBW/kg (Calculated) : 59.2   Vital Signs: Temp: 98.9 F (37.2 C) (03/20 1100) Temp Source: Oral (03/20 1100) BP: 126/77 mmHg (03/20 1300) Pulse Rate: 103 (03/20 1300)  Labs:  Recent Labs  12/04/2015 1622 02/28/16 0446 12/05/15 0430  HGB 13.0 11.7* 8.6*  HCT 39.2 35.5* 27.2*  PLT 241 246 188  LABPROT 24.2* 23.5* 20.6*  INR 2.19* 2.11* 1.77*  CREATININE 1.83* 1.79* 1.79*    Estimated Creatinine Clearance: 27.9 mL/min (by C-G formula based on Cr of 1.79).   Assessment: [age] YOM s/p R femoral nailing for subtrochanteric fracture (OR on 3/18) on warfarin for VTE prophylaxis (on warfarin PTA for AFib).  -INR= 1.77 and s/p 1mg  IV vitamin K on 3/18 -hg= 8.6 (down; likely post-op anemia)  Home dose is 2.5mg  daily except 5mg  on Mondays and Fridays. Last dose taken was 3/17.  Goal of Therapy:  INR 2-3 Monitor platelets by anticoagulation protocol: Yes   Plan:  -warfarin 5mg  po x1 tonight -daily INR  Harland GermanAndrew Anajah Sterbenz, Pharm D 12/05/2015 1:26 PM

## 2015-12-05 NOTE — Progress Notes (Signed)
Subjective: Pt stable - pain and breathing ok    Objective: Vital signs in last 24 hours: Temp:  [97.5 F (36.4 C)-99 F (37.2 C)] 98.9 F (37.2 C) (03/20 1100) Pulse Rate:  [83-104] 95 (03/20 1100) Resp:  [15-30] 16 (03/20 1100) BP: (75-163)/(46-109) 116/61 mmHg (03/20 1100) SpO2:  [85 %-100 %] 92 % (03/20 1100) FiO2 (%):  [30 %] 30 % (03/19 2031)  Intake/Output from previous day: 03/19 0701 - 03/20 0700 In: 3275 [I.V.:3010; Blood:215; IV Piggyback:50] Out: 1100 [Urine:850; Blood:250] Intake/Output this shift: Total I/O In: 300 [I.V.:300] Out: 100 [Urine:100]  Exam:  Dorsiflexion/Plantar flexion intact  Labs:  Recent Labs  2015/09/19 1622 12/16/2015 0446 12/05/15 0430  HGB 13.0 11.7* 8.6*    Recent Labs  11/20/2015 0446 12/05/15 0430  WBC 14.3* 15.1*  RBC 3.90* 2.89*  HCT 35.5* 27.2*  PLT 246 188    Recent Labs  11/30/2015 0446 12/05/15 0430  NA 144 142  K 4.9 4.6  CL 110 111  CO2 24 25  BUN 23* 25*  CREATININE 1.79* 1.79*  GLUCOSE 125* 118*  CALCIUM 8.4* 7.5*    Recent Labs  11/20/2015 0446 12/05/15 0430  INR 2.11* 1.77*    Assessment/Plan: Stable - in chair - mobilize with PT - may need snf   Stephen [name] 12/05/2015, 11:57 AM

## 2015-12-05 NOTE — Evaluation (Signed)
Occupational Therapy Evaluation Patient Details Name: Stephen [name] MRN: [id number] DOB: 05-26-1941 Today's Date: 12/05/2015    History of Present Illness Patient is a 75 y/o male with hx of A-fib,. COPD, HTN, asthma and neuromuscular disorder presents s/p fall, now s/p IM nail right femur.   Clinical Impression   Pt was performing ADL and IADL at a modified independent level prior to admission. Activity required multiple rest breaks due to COPD prior to his fall. Pt presents with R hip pain, decreased activity tolerance, generalized weakness and impaired balance interfering with his ability to function independently.  Will follow acutely.    Follow Up Recommendations  SNF;Supervision/Assistance - 24 hour    Equipment Recommendations       Recommendations for Other Services       Precautions / Restrictions Precautions Precautions: Fall Precaution Comments: watch 02 Restrictions Weight Bearing Restrictions: Yes RLE Weight Bearing: Partial weight bearing RLE Partial Weight Bearing Percentage or Pounds: 10% Other Position/Activity Restrictions: taught pt non weight bearing      Mobility Bed Mobility Overal bed mobility: Needs Assistance;+2 for physical assistance Bed Mobility: Supine to Sit     Supine to sit: Max assist;+2 for physical assistance;HOB elevated     General bed mobility comments: Assist with BLEs, scooting bottom using chuck pad and to elevate trunk to get to EOB. Increased WOB.   Transfers Overall transfer level: Needs assistance Equipment used: Rolling walker (2 wheeled) Transfers: Sit to/from [employer] Sit to Stand: Min assist;+2 safety/equipment Stand pivot transfers: Min assist;+2 physical assistance;+2 safety/equipment       General transfer comment: Assist to boost from EOB with cues for NWB RLE. min A for balance. Able to hop a few times to get to chair maintaining NWB through RLE. Sp02 dropped to 80% on 2L/min 02. Cues for  pursed lip breathing.    Balance Overall balance assessment: Needs assistance Sitting-balance support: Feet supported;Bilateral upper extremity supported Sitting balance-Leahy Scale: Fair Sitting balance - Comments: pt leaning on RW anteriorly as position of comfort due to increased WOB. Able to sit unsupported.    Standing balance support: During functional activity;Bilateral upper extremity supported Standing balance-Leahy Scale: Poor Standing balance comment: Reliant on BUEs for Min A for standing balance due to NWB RLE.                            ADL Overall ADL's : Needs assistance/impaired Eating/Feeding: Independent;Sitting (drank water)   Grooming: Wash/dry hands;Wash/dry face;Set up;Sitting   Upper Body Bathing: Minimal assitance;Sitting   Lower Body Bathing: Total assistance;+2 for physical assistance;Sit to/from stand   Upper Body Dressing : Minimal assistance;Sitting   Lower Body Dressing: +2 for physical assistance;Total assistance;Sit to/from stand   Toilet Transfer: Moderate assistance;+2 for safety/equipment;Stand-pivot;RW Toilet Transfer Details (indicate cue type and reason): simulated to chair         Functional mobility during ADLs: Rolling walker (pivoted to chair only) General ADL Comments: Pt with poor activity tolerance, requiring multiple rest breaks.     Vision Additional Comments: glasses are not within the hospital   Perception     Praxis      Pertinent Vitals/Pain Pain Assessment: 0-10 Pain Score: 5  Pain Location: R hip Pain Descriptors / Indicators: Sore;Grimacing;Guarding Pain Intervention(s): Limited activity within patient's tolerance;Monitored during session;Premedicated before session;Repositioned;Ice applied     Hand Dominance Right   Extremity/Trunk Assessment Upper Extremity Assessment Upper Extremity Assessment: Generalized weakness  Lower Extremity Assessment Lower Extremity Assessment: Defer to PT  evaluation RLE Deficits / Details: Tender to touch, Able to perform ankle pumps with pain and quad sets. RLE: Unable to fully assess due to pain RLE Coordination: decreased fine motor;decreased gross motor       Communication Communication Communication: No difficulties   Cognition Arousal/Alertness: Awake/alert Behavior During Therapy: WFL for tasks assessed/performed;Anxious Overall Cognitive Status: Within Functional Limits for tasks assessed                     General Comments       Exercises       Shoulder Instructions      Home Living Family/patient expects to be discharged to:: Skilled nursing facility Living Arrangements: Alone Available Help at Discharge: Family;Available PRN/intermittently Type of Home: House Home Access: Stairs to enter Entrance Stairs-Number of Steps: 6 Entrance Stairs-Rails: Right Home Layout: Laundry or work area in basement;Two level Alternate Level Stairs-Number of Steps: 1 flight Alternate Level Stairs-Rails: Right Bathroom Shower/Tub: Tub/shower unit         Home Equipment: Cane - single point;Shower seat;Grab bars - tub/shower          Prior Functioning/Environment Level of Independence: Independent        Comments: Drives. 2 falls in the last 6 months. Wears 02 at home 2L/min 02.    OT Diagnosis: Generalized weakness;Acute pain   OT Problem List: Decreased strength;Decreased activity tolerance;Impaired balance (sitting and/or standing);Decreased knowledge of use of DME or AE;Decreased knowledge of precautions;Pain   OT Treatment/Interventions: Self-care/ADL training;DME and/or AE instruction;Patient/family education;Therapeutic activities;Energy conservation;Balance training    OT Goals(Current goals can be found in the care plan section) Acute Rehab OT Goals Patient Stated Goal: none stated OT Goal Formulation: With patient Time For Goal Achievement: 12/19/15 Potential to Achieve Goals: Good ADL Goals Pt  Will Perform Lower Body Bathing: with min assist;with adaptive equipment;sit to/from stand Pt Will Perform Lower Body Dressing: with min assist;with adaptive equipment;sit to/from stand Pt Will Transfer to Toilet: with min assist;ambulating;bedside commode (over toilet) Pt Will Perform Toileting - Clothing Manipulation and hygiene: with min assist;sit to/from stand Additional ADL Goal #1: Pt will employ energy conservation strategies during ADL and mobility. Additional ADL Goal #2: Pt will maintain weight bearing precautions during ADL and mobility with supervision.  OT Frequency: Min 2X/week   Barriers to D/C:            Co-evaluation PT/OT/SLP Co-Evaluation/Treatment: Yes Reason for Co-Treatment: For patient/therapist safety PT goals addressed during session: Mobility/safety with mobility;Strengthening/ROM;Balance OT goals addressed during session: ADL's and self-care      End of Session Equipment Utilized During Treatment: Gait belt;Rolling walker;Oxygen Nurse Communication: Mobility status  Activity Tolerance: Patient limited by pain;Patient limited by fatigue Patient left: in chair;with call bell/phone within reach   Time: [id number] OT Time Calculation (min): 39 min Charges:  OT General Charges $OT Visit: 1 Procedure OT Evaluation $OT Eval Moderate Complexity: 1 Procedure G-Codes:    Evern BioMayberry, Loic Hobin Lynn 12/05/2015, 12:12 PM  [phone]

## 2015-12-05 NOTE — Progress Notes (Signed)
RT NOTE:  Pt removed from BIPAP. WOB & SOB has improved. Pt wearing 2L [place]. Pt tolerating well. RT/RN will monitor.

## 2015-12-05 NOTE — Significant Event (Signed)
Patient has order to remove foley catheter POD 1. However, patient refuses for staff to remove the foley catheter today. Patient educated on the importance of early removal to prevent HAI. Patient continues to refuse, states he wants to to keep it one more day. Spoke to Dr. Molli KnockYacoub about this during rounds-which MD states can leave one more day.

## 2015-12-05 NOTE — Evaluation (Signed)
Physical Therapy Evaluation Patient Details Name: Stephen [name] MRN: [id number] DOB: 23-Jul-1941 Today's Date: 12/05/2015   History of Present Illness  Patient is a 75 y/o male with hx of A-fib,. COPD, HTN, asthma and neuromuscular disorder presents s/p fall, now s/p IM nail right femur.  Clinical Impression  Patient presents with pain, decreased AROM/strength RLE s/p above surgery, PWB RLE and decreased respiratory status all impacting safe mobility. Increased time for all mobility due to needed rest breaks secondary to increased WOB and drop in SP02 to 80s on 2L/min 02. Tolerated getting to chair with assist of 2. Pt lives alone and was Independent PTA but does report hx of falls. Would benefit from ST SNF to maximize independence and mobility prior to return home.    Follow Up Recommendations SNF;Supervision/Assistance - 24 hour    Equipment Recommendations  Rolling walker with 5" wheels    Recommendations for Other Services       Precautions / Restrictions Precautions Precautions: Fall Precaution Comments: watch 02 Restrictions Weight Bearing Restrictions: Yes RLE Weight Bearing: Partial weight bearing RLE Partial Weight Bearing Percentage or Pounds: 10%      Mobility  Bed Mobility Overal bed mobility: Needs Assistance;+2 for physical assistance Bed Mobility: Supine to Sit     Supine to sit: Max assist;+2 for physical assistance;HOB elevated     General bed mobility comments: Assist with BLEs, scooting bottom using chuck pad and to elevate trunk to get to EOB. Increased WOB.   Transfers Overall transfer level: Needs assistance Equipment used: Rolling walker (2 wheeled) Transfers: Sit to/from [employer] Sit to Stand: Min assist;+2 safety/equipment Stand pivot transfers: Min assist;+2 physical assistance;+2 safety/equipment       General transfer comment: Assist to boost from EOB with cues for NWB RLE. min A for balance. Able to hop a few times to  get to chair maintaining NWB through RLE. Sp02 dropped to 80% on 2L/min 02. Cues for pursed lip breathing.  Ambulation/Gait                Stairs            Wheelchair Mobility    Modified Rankin (Stroke Patients Only)       Balance Overall balance assessment: Needs assistance Sitting-balance support: Feet supported;Bilateral upper extremity supported Sitting balance-Leahy Scale: Fair Sitting balance - Comments: pt leaning on RW anteriorly as position of comfort due to increased WOB. Able to sit unsupported.    Standing balance support: During functional activity;Bilateral upper extremity supported Standing balance-Leahy Scale: Poor Standing balance comment: Reliant on BUEs for Min A for standing balance due to NWB RLE.                             Pertinent Vitals/Pain Pain Assessment: 0-10 Pain Score: 5  Pain Location: right hip Pain Descriptors / Indicators: Sore;Tender Pain Intervention(s): Monitored during session;Repositioned;Premedicated before session;Ice applied;Limited activity within patient's tolerance    Home Living Family/patient expects to be discharged to:: Skilled nursing facility Living Arrangements: Alone Available Help at Discharge: Family;Available PRN/intermittently Type of Home: House Home Access: Stairs to enter Entrance Stairs-Rails: Right Entrance Stairs-Number of Steps: 6 Home Layout: Laundry or work area in basement;Two level Home Equipment: Cane - single point;Shower seat;Grab bars - tub/shower      Prior Function Level of Independence: Independent         Comments: Drives. 2 falls in the last 6 months. Wears 02  at home 2L/min 02.     Hand Dominance   Dominant Hand: Right    Extremity/Trunk Assessment   Upper Extremity Assessment: Defer to OT evaluation           Lower Extremity Assessment: RLE deficits/detail;Generalized weakness RLE Deficits / Details: Tender to touch, Able to perform ankle pumps  with pain and quad sets.       Communication   Communication: No difficulties  Cognition Arousal/Alertness: Awake/alert Behavior During Therapy: WFL for tasks assessed/performed;Anxious Overall Cognitive Status: Within Functional Limits for tasks assessed                      General Comments      Exercises General Exercises - Lower Extremity Ankle Circles/Pumps: Both;10 reps;Supine Quad Sets: Both;5 reps;Seated      Assessment/Plan    PT Assessment Patient needs continued PT services  PT Diagnosis Acute pain;Difficulty walking   PT Problem List Decreased strength;Pain;Cardiopulmonary status limiting activity;Decreased range of motion;Decreased activity tolerance;Decreased balance;Decreased mobility;Decreased knowledge of use of DME  PT Treatment Interventions Balance training;Gait training;Functional mobility training;Therapeutic activities;Therapeutic exercise;Patient/family education;Wheelchair mobility training;DME instruction   PT Goals (Current goals can be found in the Care Plan section) Acute Rehab PT Goals Patient Stated Goal: none stated PT Goal Formulation: With patient Time For Goal Achievement: 12/20/15 Potential to Achieve Goals: Fair    Frequency Min 3X/week   Barriers to discharge Decreased caregiver support;Inaccessible home environment lives alone and has stairs to get into home    Co-evaluation PT/OT/SLP Co-Evaluation/Treatment: Yes Reason for Co-Treatment: For patient/therapist safety PT goals addressed during session: Mobility/safety with mobility;Strengthening/ROM;Balance         End of Session Equipment Utilized During Treatment: Gait belt;Oxygen Activity Tolerance: Patient limited by pain;Treatment limited secondary to medical complications (Comment) (drop in Sp02 with activity) Patient left: in chair;with call bell/phone within reach;with SCD's reapplied Nurse Communication: Mobility status;Other (comment) (transfer technique)          Time: [id number] PT Time Calculation (min) (ACUTE ONLY): 40 min   Charges:   PT Evaluation $PT Eval Moderate Complexity: 1 Procedure PT Treatments $Therapeutic Activity: 8-22 mins   PT G Codes:        Iktan Aikman A Carl Bleecker 12/05/2015, 11:01 AM Mylo RedShauna Arafat Cocuzza, PT, DPT [phone]

## 2015-12-05 NOTE — Progress Notes (Signed)
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  Invalid input(s): PCO2, PO2  Studies/Results: Dg Chest 1 View  12/02/2015  CLINICAL DATA:  Pt reports he tripped and fell in his living room today as he was walking into the house and landed on his right side; obvious deformity to right leg; pt denies left hip pain; best obtainable images due to pt's condition. EXAM: CHEST 1 VIEW COMPARISON:  08/14/2011 FINDINGS: Lungs are hyperinflated. Heart is mildly enlarged. Aorta is tortuous and densely calcified. Moderate emphysematous changes are present. There is perihilar bronchitic change. There are no focal consolidations or pleural effusions. Bilateral mid lung zone atelectasis or scarring is noted. No pulmonary edema. No evidence for pneumothorax or acute fracture. IMPRESSION: 1. Emphysematous changes and bronchitic changes.  2. Atelectasis or scarring in the mid lung zones bilaterally. 3. No evidence for pneumothorax or acute fracture. Electronically Signed   By: Norva PavlovElizabeth  Brown M.D.   On: 11/27/2015 17:27   Pelvis Portable  06/16/2016  CLINICAL DATA:  Operative fixation of a hip fracture. EXAM: PORTABLE PELVIS 1-2 VIEWS COMPARISON:  Previous examinations yesterday and earlier today. FINDINGS: The screw, rod and cerclage wire fixation of the previously demonstrated right femoral subtrochanteric fracture. Anatomic position and alignment of the fragments. There is a mildly comminuted fracture through the medial cortex of the distal femur at the location of the distal fixation screw. Postoperative air is noted as well as atheromatous arterial calcifications. IMPRESSION: 1. Mildly comminuted fracture through the medial cortex of the distal femur at the location of the distal fixation screw. 2. Hardware fixation of the previously demonstrated subtrochanteric fracture with anatomic position and alignment. These results will be called to the ordering clinician or representative by the Radiologist Assistant, and communication documented in the PACS or zVision Dashboard. Electronically Signed   By: Beckie SaltsSteven  Reid M.D.   On: 009/30/2017 14:09   Dg Abd Portable 1v  12/10/2015  CLINICAL DATA:  Assess for small bowel obstruction. Initial encounter. EXAM: PORTABLE ABDOMEN - 1 VIEW COMPARISON:  MRI of the lumbar spine performed 05/23/2011 FINDINGS: The stomach is partially filled with air. The colon is partially filled with stool. No free intra-abdominal air is seen, though evaluation for free air is limited on a single supine view. No free intra-abdominal air  is seen, though evaluation for free air is limited on a single supine view. No acute osseous abnormalities are seen. The visualized lung bases are grossly clear. IMPRESSION: Unremarkable bowel gas pattern; no free intra-abdominal air seen. Moderate amount of stool noted in the colon. Stomach  partially filled with air. Electronically Signed   By: Roanna Raider M.D.   On: 12/12/2015 23:03   Dg C-arm 61-120 Min  12/30/15  CLINICAL DATA:  Operative fixation of a subtrochanteric proximal right femur fracture. EXAM: RIGHT FEMUR 2 VIEWS; DG C-ARM 61-120 MIN COMPARISON:  Right hip radiographs obtained yesterday. FINDINGS: Seven C-arm views of the right hip and femur demonstrate compression screw and rod fixation of the previously demonstrated subtrochanteric right femur fracture. Cerclage wires are also in place. Anatomic position and alignment. IMPRESSION: Anatomic position and alignment of the proximal right femur fracture following hardware fixation. Electronically Signed   By: Beckie Salts M.D.   On: 2015-12-30 11:19   Dg Femur, Min 2 Views Right  December 30, 2015  CLINICAL DATA:  Operative fixation of a subtrochanteric proximal right femur fracture. EXAM: RIGHT FEMUR 2 VIEWS; DG C-ARM 61-120 MIN COMPARISON:  Right hip radiographs obtained yesterday. FINDINGS: Seven C-arm views of the right hip and femur demonstrate compression screw and rod fixation of the previously demonstrated subtrochanteric right femur fracture. Cerclage wires are also in place. Anatomic position and alignment. IMPRESSION: Anatomic position and alignment of the proximal right femur fracture following hardware fixation. Electronically Signed   By: Beckie Salts M.D.   On: Dec 30, 2015 11:19   Dg Hips Bilat With Pelvis 3-4 Views  12/02/2015  CLINICAL DATA:  Fall with right hip pain EXAM: DG HIP (WITH OR WITHOUT PELVIS) 3-4V BILAT COMPARISON:  None. FINDINGS: There is an oblique subtrochanteric fracture in the proximal right femur, with prominent apex anterior angulation, 2.5 cm medial displacement of the distal fracture fragment and 6 cm over riding of the fracture fragments. No additional fracture is seen in the pelvis or hips. No dislocation at the hip joints. No appreciable hip arthropathy. No pelvic diastasis. Diffuse osteopenia.  No suspicious focal osseous lesions. IMPRESSION: Oblique, displaced, overriding and angulated subtrochanteric right proximal femur fracture. Diffuse osteopenia. Electronically Signed   By: Delbert Phenix M.D.   On: 12/09/2015 17:28    Anti-infectives: Anti-infectives    Start     Dose/Rate Route Frequency Ordered Stop   12/30/2015 1730  clindamycin (CLEOCIN) IVPB 600 mg     600 mg 100 mL/hr over 30 Minutes Intravenous Every 6 hours 30-Dec-2015 1718 12/05/15 0000   December 30, 2015 0700  clindamycin (CLEOCIN) IVPB 600 mg     600 mg 100 mL/hr over 30 Minutes Intravenous To [facility] 12/15/2015 2333 12-30-15 0829      Assessment/Plan: s/p Procedure(s): INTRAMEDULLARY (IM) NAIL INTERTROCHANTRIC (Right)  Umbilical hernia Suspect hernia chronically incarcerated with omentum.  No surgery planned this admission.  Will see as an outpatient.  Will sign off.  LOS: 2 days    Vincie Linn A 12/05/2015

## 2015-12-05 NOTE — Progress Notes (Signed)
PULMONARY / CRITICAL CARE MEDICINE   Name: Stephen [name] MRN: [id number] DOB: [birth year]    ADMISSION DATE:  12/11/2015 CONSULTATION DATE:  11/21/2015  REFERRING MD:  Dr August Saucer, Ortho  CHIEF COMPLAINT:  Respiratory Distress  HISTORY OF PRESENT ILLNESS:   Senna 75-year-old man with severe COPD, prednisone and oxygen dependent, age fibrillation on Coumadin, hypertension. He was admitted on 11/26/2015 with a right hip fracture after mechanical fall.  He went to the operating room on 3/19 for hip repair. In the PACU he was extubated with some difficulty but then continued to have significant increased work of breathing. PCCM asked to evaluate.  SUBJECTIVE:  No events overnight, no sign of respiratory distress.  VITAL SIGNS: BP 126/77 mmHg  Pulse 103  Temp(Src) 98.9 F (37.2 C) (Oral)  Resp 30  Ht  (1.626 m)  Wt 55.3 kg (121 lb 14.6 oz)  BMI 20.92 kg/m2  SpO2 94%  HEMODYNAMICS:    VENTILATOR SETTINGS: Vent Mode:  [-]  FiO2 (%):  [30 %] 30 %  INTAKE / OUTPUT: I/O last 3 completed shifts: In: 4315 [I.V.:3745; Blood:520; IV Piggyback:50] Out: 1100 [Urine:850; Blood:250]  PHYSICAL EXAMINATION: General:  Cachectic man in moderate respiratory distress Neuro:  Awake alert and interactive, oriented, nonfocal exam HEENT:  OP moist, nasal congestion and some difficulty moving air through his nose Cardiovascular:  Regular, no murmur Lungs:  Significant increase in work of breathing with accessory muscle use, no wheezing Abdomen:  Soft nontender positive bowel sounds Musculoskeletal:  Right lower extremity painful to touch, no significant edema Skin:  No rash  LABS:  BMET  Recent Labs Lab 12/13/2015 1622 12/02/2015 0446 12/05/15 0430  NA 142 144 142  K 4.9 4.9 4.6  CL 108 110 111  CO2 21* 24 25  BUN 23* 23* 25*  CREATININE 1.83* 1.79* 1.79*  GLUCOSE 126* 125* 118*    Electrolytes  Recent Labs Lab 12/16/2015 1622 12/09/2015 0446 12/05/15 0430  CALCIUM 8.6* 8.4* 7.5*     CBC  Recent Labs Lab 12/09/2015 1622 11/18/2015 0446 12/05/15 0430  WBC 10.4 14.3* 15.1*  HGB 13.0 11.7* 8.6*  HCT 39.2 35.5* 27.2*  PLT 241 246 188    Coag's  Recent Labs Lab 11/25/2015 1622 12/11/2015 0446 12/05/15 0430  INR 2.19* 2.11* 1.77*    Sepsis Markers No results for input(s): LATICACIDVEN, PROCALCITON, O2SATVEN in the last 168 hours.  ABG No results for input(s): PHART, PCO2ART, PO2ART in the last 168 hours.  Liver Enzymes  Recent Labs Lab 12/07/2015 1622  AST 22  ALT 20  ALKPHOS 84  BILITOT 0.6  ALBUMIN 3.3*    Cardiac Enzymes No results for input(s): TROPONINI, PROBNP in the last 168 hours.  Glucose No results for input(s): GLUCAP in the last 168 hours.  Imaging No results found.   STUDIES:   CULTURES:  ANTIBIOTICS: Clindamycin post procedure  SIGNIFICANT EVENTS: Right hip repair 3/19  LINES/TUBES:  I reviewed CXR myself, hyperinflation noted.  DISCUSSION: 75 year old man with severe COPD with acute on chronic respiratory failure following right hip surgery  ASSESSMENT / PLAN:  PULMONARY A: Acute on chronic respiratory failure without any current wheezing P:   Change bronchodilators to DuoNeb Prednisone 50 mg PO daily x5 days then change to home dose of 10 mg daily. Continue oxygen as needed to keep saturations greater than 90% D/C BiPAP.  CARDIOVASCULAR A:  HTN Atrial fibrillation P:  Hold antihypertensive regimen while on narcotics for pain control Continue Coumadin per pharmacy  for a-fib. Continue amiodarone  RENAL A:   Chronic renal insufficiency P:   Follow BMP Follow UOP Replace electrolytes as indicated KVO IVF.  GASTROINTESTINAL A:   Umbilical hernia P:   Planned follow-up with [facility] as an outpatient  HEMATOLOGIC A:   Chronic anticoagulation P:  Resume Coumadin  INFECTIOUS A:   No evidence of active infection P:   Prophylactic clindamycin  ENDOCRINE A:   hypothyroidism   P:   Continue  Synthroid  NEUROLOGIC A:   Pain control post hip surgery P:   RASS goal: 0 Pain control as ordererd  Discussed with [facility]-MD, [facility] with [facility]M off 3/21.  Alyson ReedyWesam G. [name], M.D. [facility]. Pager: [phone]. After hours pager: [phone].

## 2015-12-05 NOTE — Significant Event (Signed)
Patient taken to 3E-17, taken via wheelchair by Judeth CornfieldStephanie FLippin. Patient's VS stable prior to the transport. Report given to receiving RN Gabe. Patient has all his belongings taken to new room (clothes). Family made aware of the transfer. Leeza Heiner, RCharity fundraiser

## 2015-12-06 ENCOUNTER — Inpatient Hospital Stay (HOSPITAL_COMMUNITY): Payer: PPO

## 2015-12-06 DIAGNOSIS — D62 Acute posthemorrhagic anemia: Secondary | ICD-10-CM

## 2015-12-06 DIAGNOSIS — J9620 Acute and chronic respiratory failure, unspecified whether with hypoxia or hypercapnia: Secondary | ICD-10-CM

## 2015-12-06 LAB — CBC
HCT: 28.3 % — ABNORMAL LOW (ref 39.0–52.0)
Hemoglobin: 8.8 g/dL — ABNORMAL LOW (ref 13.0–17.0)
MCH: 29.4 pg (ref 26.0–34.0)
MCHC: 31.1 g/dL (ref 30.0–36.0)
MCV: 94.6 fL (ref 78.0–100.0)
PLATELETS: 216 10*3/uL (ref 150–400)
RBC: 2.99 MIL/uL — ABNORMAL LOW (ref 4.22–5.81)
RDW: 16.2 % — AB (ref 11.5–15.5)
WBC: 16.3 10*3/uL — ABNORMAL HIGH (ref 4.0–10.5)

## 2015-12-06 LAB — BASIC METABOLIC PANEL
Anion gap: 7 (ref 5–15)
BUN: 29 mg/dL — AB (ref 6–20)
CO2: 25 mmol/L (ref 22–32)
CREATININE: 1.87 mg/dL — AB (ref 0.61–1.24)
Calcium: 8 mg/dL — ABNORMAL LOW (ref 8.9–10.3)
Chloride: 109 mmol/L (ref 101–111)
GFR calc Af Amer: 39 mL/min — ABNORMAL LOW (ref >60)
GFR, EST NON AFRICAN AMERICAN: 34 mL/min — AB (ref >60)
Glucose, Bld: 125 mg/dL — ABNORMAL HIGH (ref 65–99)
Potassium: 4.6 mmol/L (ref 3.5–5.1)
SODIUM: 141 mmol/L (ref 135–145)

## 2015-12-06 LAB — PROTIME-INR
INR: 1.71 — ABNORMAL HIGH (ref 0.00–1.49)
Prothrombin Time: 20.1 seconds — ABNORMAL HIGH (ref 11.6–15.2)

## 2015-12-06 LAB — MAGNESIUM: Magnesium: 2.2 mg/dL (ref 1.7–2.4)

## 2015-12-06 LAB — GLUCOSE, CAPILLARY: Glucose-Capillary: 149 mg/dL — ABNORMAL HIGH (ref 65–99)

## 2015-12-06 LAB — PHOSPHORUS: Phosphorus: 3.1 mg/dL (ref 2.5–4.6)

## 2015-12-06 LAB — VITAMIN D 25 HYDROXY (VIT D DEFICIENCY, FRACTURES): Vit D, 25-Hydroxy: 16.2 ng/mL — ABNORMAL LOW (ref 30.0–100.0)

## 2015-12-06 MED ORDER — WARFARIN SODIUM 5 MG PO TABS
5.0000 mg | ORAL_TABLET | Freq: Once | ORAL | Status: AC
  Administered 2015-12-06: 5 mg via ORAL
  Filled 2015-12-06: qty 1

## 2015-12-06 MED ORDER — SODIUM CHLORIDE 0.9 % IV SOLN
INTRAVENOUS | Status: DC
Start: 2015-12-06 — End: 2015-12-07
  Administered 2015-12-06: 1000 mL via INTRAVENOUS
  Administered 2015-12-07: 04:00:00 via INTRAVENOUS

## 2015-12-06 MED ORDER — POLYETHYLENE GLYCOL 3350 17 G PO PACK
17.0000 g | PACK | Freq: Two times a day (BID) | ORAL | Status: DC
  Administered 2015-12-06 – 2015-12-07 (×4): 17 g via ORAL
  Filled 2015-12-06 (×5): qty 1

## 2015-12-06 NOTE — Progress Notes (Signed)
Pt stable  Leg with less pain Mobilize with PT

## 2015-12-06 NOTE — Progress Notes (Signed)
ANTICOAGULATION CONSULT NOTE - Follow Up Consult  Pharmacy Consult for warfarin Indication: atrial fibrillation  Allergies  Allergen Reactions  . Penicillins Other (See Comments)    REACTION: feels drunk    Patient Measurements: Height: 5\' 5"  (165.1 cm) Weight: 135 lb 12.8 oz (61.598 kg) IBW/kg (Calculated) : 61.5  Vital Signs: Temp: 98.4 F (36.9 C) (03/21 1145) Temp Source: Oral (03/21 1145) BP: 142/63 mmHg (03/21 1145) Pulse Rate: 97 (03/21 1145)  Labs:  Recent Labs  12/01/2015 0446 12/05/15 0430 12/06/15 0329  HGB 11.7* 8.6* 8.8*  HCT 35.5* 27.2* 28.3*  PLT 246 188 216  LABPROT 23.5* 20.6* 20.1*  INR 2.11* 1.77* 1.71*  CREATININE 1.79* 1.79* 1.87*    Estimated Creatinine Clearance: 29.7 mL/min (by C-G formula based on Cr of 1.87).   Assessment: [age] YOM s/p R femoral nailing for subtrochanteric fracture (OR on 3/18) on warfarin for VTE prophylaxis (on warfarin PTA for AFib).  -INR 1.71 and s/p 1mg  IV vitamin K on 3/18 -Hb 8.6 (down; likely post-op anemia)  Home dose is 2.5mg  daily except 5mg  on Mondays and Fridays. Last dose taken was 3/17.  Goal of Therapy:  INR 2-3 Monitor platelets by anticoagulation protocol: Yes   Plan:  - Warfarin 5 mg PO tonight - Daily INR - Monitor for s/sx of bleeding   [facility] ValleyJennifer Chatsworth, [address] BoulevardPharm.D., BCPS Clinical Pharmacist Pager: [phone] 12/06/2015 12:52 PM

## 2015-12-06 NOTE — Progress Notes (Addendum)
[facility] PROGRESS NOTE  Stephen [name] ZOX:[id number] DOB: 12-06-1940 DOA: 11/19/2015 PCP: Rogelia Boga, MD  Assessment/Plan: 75 year old man with severe COPD, prednisone and oxygen dependent, Atrial fibrillation on Coumadin, hypertension. He was admitted on 12/04/2015 with a right hip fracture after mechanical fall. He went to the operating room on 3/19 for hip repair. In the PACU he was extubated with some difficulty but then continued to have significant increased work of breathing. PCCM asked to evaluate. Patient was started on BIPAP for increase work of breathing and nebulizer treatments. His respiratory failure stabilized and he was transfer to [facility] 13-1.  75 year old man with severe COPD with acute on chronic respiratory failure following right hip surgery  Acute on chronic respiratory failure without any current wheezing Change bronchodilators to DuoNeb Prednisone 50 mg PO daily x5 days then change to home dose of 10 mg daily. Oxygen saturation 92 % on 3 L.   Acute blood loss anemia;  Post surgery.  Hb stable today at 8.6. If continue to drop, patient will need Blood transfusion.   Leukocytosis.  On steroids.  UA negative.  Check chest x ray. Monitor for fever.   Right Subtrochanteric femur fracture:  S/P open reduction and internal fixation by Dr August Saucer 3-19. PT per ortho.   Atrial fibrillation Continue Coumadin per pharmacy for a-fib. Monitor Hb Continue amiodarone  Chronic renal insufficiency stage III: Cr per records 1.6  Continue to monitor labs.   gentle hydration.   Umbilical hernia  Planned follow-up with [facility] as an outpatient hernia chronically incarcerated with omentum Evaluated by Dr Magnus Ivan during this admission. Plan to follow up outpatient.   Hypothyroidism Continue Synthroid  Constipation; bowel regimen.    Code Status: Full Code.  Family Communication: care discussed with patient.  Disposition Plan: Remain inpatient, await PT  evaluation and improvement of medical conditions.    Consultants:  Ortho, Dr August Saucer.   Procedures:  Hip sx  Antibiotics:  none  HPI/Subjective: Patient feeling ok, breathing ok, denies worsening SOB. SOB on exertion   Objective: Filed Vitals:   12/06/15 1016 12/06/15 1145  BP: 125/68 142/63  Pulse: 100 97  Temp:  98.4 F (36.9 C)  Resp:  18    Intake/Output Summary (Last 24 hours) at 12/06/15 1421 Last data filed at 12/06/15 1037  Gross per 24 hour  Intake    360 ml  Output    875 ml  Net   -515 ml   Filed Weights   12/01/2015 2036 12/05/15 1614 12/06/15 0441  Weight: 55.3 kg (121 lb 14.6 oz) 59.5 kg (131 lb 2.8 oz) 61.598 kg (135 lb 12.8 oz)    Exam:   General:  Alert in no distress  Cardiovascular: S 1, S 2 RRR  Respiratory: Bilateral air movement , no wheezing.   Abdomen: BS present, soft, nt  Musculoskeletal: no edema   Data Reviewed: Basic Metabolic Panel:  Recent Labs Lab 11/17/2015 1622 December 15, 2015 0446 12/05/15 0430 12/06/15 0329  NA 142 144 142 141  K 4.9 4.9 4.6 4.6  CL 108 110 111 109  CO2 21* GLUCOSE 126* 125* 118* 125*  BUN 23* 23* 25* 29*  CREATININE 1.83* 1.79* 1.79* 1.87*  CALCIUM 8.6* 8.4* 7.5* 8.0*  MG  --   --   --  2.2  PHOS  --   --   --  3.1   Liver Function Tests:  Recent Labs Lab 12/02/2015 1622  AST 22  ALT 20  ALKPHOS  84  BILITOT 0.6  PROT 5.4*  ALBUMIN 3.3*   No results for input(s): LIPASE, AMYLASE in the last 168 hours. No results for input(s): AMMONIA in the last 168 hours. CBC:  Recent Labs Lab November 29, 2015 1622 12/08/2015 0446 12/05/15 0430 12/06/15 0329  WBC 10.4 14.3* 15.1* 16.3*  NEUTROABS 8.8* 12.0*  --   --   HGB 13.0 11.7* 8.6* 8.8*  HCT 39.2 35.5* 27.2* 28.3*  MCV 92.9 91.0 94.1 94.6  PLT 241 246 188 216   Cardiac Enzymes: No results for input(s): CKTOTAL, CKMB, CKMBINDEX, TROPONINI in the last 168 hours. BNP (last 3 results) No results for input(s): BNP in the last 8760  hours.  ProBNP (last 3 results) No results for input(s): PROBNP in the last 8760 hours.  CBG:  Recent Labs Lab 12/06/15 1213  GLUCAP 149*    Recent Results (from the past 240 hour(s))  Surgical pcr screen     Status: None   Collection Time: 11/23/2015  1:12 AM  Result Value Ref Range Status   MRSA, PCR NEGATIVE NEGATIVE Final   Staphylococcus aureus NEGATIVE NEGATIVE Final    Comment:        The Xpert SA Assay (FDA approved for NASAL specimens in patients over 76 years of age), is one component of a comprehensive surveillance program.  Test performance has been validated by [facility] for patients greater than or equal to 76 year old. It is not intended to diagnose infection nor to guide or monitor treatment.      Studies: No results found.  Scheduled Meds: . amiodarone  200 mg Oral Daily  . levalbuterol  1.25 mg Nebulization TID  . levothyroxine  75 mcg Oral QAC breakfast  . mometasone-formoterol  2 puff Inhalation BID  . pantoprazole  40 mg Oral QHS  . polyethylene glycol  17 g Oral BID  . predniSONE  50 mg Oral Q breakfast  . warfarin  5 mg Oral ONCE-1800  . Warfarin - Pharmacist Dosing Inpatient   Does not apply q1800   Continuous Infusions: . sodium chloride      Active Problems:   ATRIAL FIBRILLATION, PAROXYSMAL   Long term current use of anticoagulant therapy   Hip fracture (HCC)   Fracture   Acute on chronic respiratory failure (HCC)   Postoperative anemia due to acute blood loss    Time spent: 35 minutes.     Hartley Barefootegalado, Kerolos Nehme A  [facility] Pager [phone]. If 7PM-7AM, please contact night-coverage at [email or web address], password [email or web address] 12/06/2015, 2:21 PM  LOS: 3 days

## 2015-12-06 NOTE — NC FL2 (Signed)
[place] MEDICAID FL2 LEVEL OF CARE SCREENING TOOL     IDENTIFICATION  Patient Name: Stephen [name] Birthdate: 1941/08/27 Sex: male Admission Date (Current Location): December 06, 2015  [facility] and [place] Number:  Producer, television/film/video and Address:  [facility] [place]. [facility], [address] [place], [place] [address]      Provider Number: [id number]  Attending Physician Name and Address:  Alba Cory, MD  Relative Name and Phone Number:       Current Level of Care: Hospital Recommended Level of Care: Skilled Nursing Facility Prior Approval Number:    Date Approved/Denied: 12/06/15 PASRR Number: [id number]  Discharge Plan: SNF    Current Diagnoses: Patient Active Problem List   Diagnosis Date Noted  . Hip fracture (HCC) 12-06-2015  . Fracture 2015/12/06  . Encounter for therapeutic drug monitoring 10/20/2013  . Long term current use of anticoagulant therapy 12/21/2010  . Hypotension 10/10/2010  . BENIGN PROSTATIC HYPERTROPHY 04/20/2008  . ATRIAL FIBRILLATION, PAROXYSMAL 05/28/2007  . COPD, severe (HCC) 05/28/2007  . 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 Essential hypertension 04/22/2007  . ASTHMA 04/22/2007    Orientation RESPIRATION BLADDER Height & Weight     Self, Place, Situation  O2 (3 Liters) Indwelling catheter Weight: 135 lb 12.8 oz (61.598 kg) Height:   (165.1 cm)  BEHAVIORAL SYMPTOMS/MOOD NEUROLOGICAL BOWEL NUTRITION STATUS      Continent Diet  AMBULATORY STATUS COMMUNICATION OF NEEDS Skin   Extensive Assist Verbally Other (Comment) (incision )                       Personal Care Assistance Level of Assistance  Bathing, Feeding, Dressing Bathing Assistance: Maximum assistance Feeding assistance: Independent Dressing Assistance: Maximum assistance     Functional Limitations Info  Sight, Hearing, Speech Sight Info: Adequate Hearing Info: Adequate Speech Info: Adequate    SPECIAL CARE FACTORS FREQUENCY   PT (By licensed PT), OT (By licensed OT)                    Contractures      Additional Factors Info  Code Status, Allergies Code Status Info: FULL Allergies Info: Penicillins           Current Medications (12/06/2015):  This is the current hospital active medication list Current Facility-Administered Medications  Medication Dose Route Frequency Provider Last Rate Last Dose  . acetaminophen (TYLENOL) tablet 650 mg  650 mg Oral Q6H PRN Cammy Copa, MD       Or  . acetaminophen (TYLENOL) suppository 650 mg  650 mg Rectal Q6H PRN Cammy Copa, MD      . amiodarone (PACERONE) tablet 200 mg  200 mg Oral Daily Eston Esters, MD   200 mg at 12/06/15 1010  . bisacodyl (DULCOLAX) suppository 10 mg  10 mg Rectal Daily PRN Eston Esters, MD      . HYDROcodone-acetaminophen (NORCO/VICODIN) 5-325 MG per tablet 1-2 tablet  1-2 tablet Oral Q6H PRN Cammy Copa, MD   2 tablet at 12/06/15 0000  . levalbuterol (XOPENEX) nebulizer solution 0.63 mg  0.63 mg Nebulization Q6H PRN Leslye Peer, MD   0.63 mg at 12/05/15 1605  . levalbuterol (XOPENEX) nebulizer solution 1.25 mg  1.25 mg Nebulization TID Leslye Peer, MD   1.25 mg at 12/06/15 5621  . levothyroxine (SYNTHROID, LEVOTHROID) tablet 75 mcg  75 mcg Oral QAC breakfast Eston Esters, MD   75 mcg at 12/06/15  1010  . menthol-cetylpyridinium (CEPACOL) lozenge 3 mg  1 lozenge Oral PRN Cammy CopaScott Gregory Dean, MD       Or  . phenol (CHLORASEPTIC) mouth spray 1 spray  1 spray Mouth/Throat PRN Cammy CopaScott Gregory Dean, MD      . metoCLOPramide (REGLAN) tablet 5-10 mg  5-10 mg Oral Q8H PRN Cammy CopaScott Gregory Dean, MD       Or  . metoCLOPramide (REGLAN) injection 5-10 mg  5-10 mg Intravenous Q8H PRN Cammy CopaScott Gregory Dean, MD      . mometasone-formoterol [facility](DULERA) 200-5 MCG/ACT inhaler 2 puff  2 puff Inhalation BID Alyson ReedyWesam G [name], MD   2 puff at 12/06/15 [phone]  . morphine 2 MG/ML injection 0.5 mg  0.5 mg Intravenous Q2H PRN Cammy CopaScott Gregory Dean, MD   0.5 mg  at 12/11/2015 1934  . ondansetron (ZOFRAN) tablet 4 mg  4 mg Oral Q6H PRN Cammy CopaScott Gregory Dean, MD       Or  . ondansetron [facility](ZOFRAN) injection 4 mg  4 mg Intravenous Q6H PRN Cammy CopaScott Gregory Dean, MD      . pantoprazole (PROTONIX) EC tablet 40 mg  40 mg Oral QHS Silvana Newnessndrew D Meyer, RPH   40 mg at 12/05/15 2129  . predniSONE (DELTASONE) tablet 50 mg  50 mg Oral Q breakfast Alyson ReedyWesam G [name], MD   50 mg at 12/06/15 1010  . Warfarin - Pharmacist Dosing Inpatient   Does not apply q1800 Lauren D Bajbus, RPH         Discharge Medications: Please see discharge summary for a list of discharge medications.  Relevant Imaging Results:  Relevant Lab Results:   Additional Information SS#: [id number]  Loleta DickerJoyce S Ruddy Swire, LCSW

## 2015-12-06 NOTE — Clinical Social Work Note (Signed)
Clinical Social Work Assessment  Patient Details  Name: Stephen [name] MRN: [id number] Date of Birth: Jan 28, 1941  Date of referral:  12/06/15               Reason for consult:  Facility Placement                Permission sought to share information with:  Case Manager, Family Supports Permission granted to share information::  Yes, Verbal Permission Granted  Name::     Stephen [name]  Agency::     Relationship::  Daughter  Contact Information:  [phone]  Housing/Transportation Living arrangements for the past 2 months:  Single Family Home Source of Information:  Patient Patient Interpreter Needed:  None Criminal Activity/Legal Involvement Pertinent to Current Situation/Hospitalization:  No - Comment as needed Significant Relationships:  Adult Children Lives with:  Self Do you feel safe going back to the [name] where you live?  Yes Need for family participation in patient care:  Yes (Comment)  Care giving concerns:  None reported at this time.    Social Worker assessment / plan:  CSW received consult by MD. CSW went to speak with patient regarding the possible need for short term rehab. CSW introduced self and acknowledged the patient. Patient is alert and orientedx4. Patient was calm and cooperative with CSW assessment. No family was at bedside. CSW informed patient of PT recommendation for short term rehab. Patient is agreeable to SNF placement. CSW provided patient with a [place] of SN[place] facilities [name], and was provided permission to fax clinical information out to the facilities. Patient's preferences: (1) [facility] [name], (2) [name] and (3) [name]. CSW to complete FL2 for MD signature. CSW to initiate SNF placement process.    Employment status:  Disabled (Comment on whether or not currently receiving Disability) Insurance information:  Managed Medicare PT Recommendations:  Skilled Nursing Facility Information / Referral to community resources:  Skilled  Nursing Facility  Patient/Family's Response to care:  Patient is agreeable to SNF placement in [name].   Patient/Family's Understanding of and Emotional Response to Diagnosis, Current Treatment, and Prognosis:  Patient is aware and understanding of current treatment and prognosis. Patient states he would really like to go home, however he knows that he would benefit most from short term rehab.   Emotional Assessment Appearance:  Appears stated age Attitude/Demeanor/Rapport:   (Calm and Cooperative) Affect (typically observed):  Accepting, Appropriate, Calm Orientation:  Oriented to Self, Oriented to [name], Oriented to  Time, Oriented to Situation Alcohol / Substance use:  Not Applicable Psych involvement (Current and /or in the community):  No (Comment)  Discharge Needs  Concerns to be addressed:  Discharge Planning Concerns Readmission within the last 30 days:  No Current discharge risk:  Physical Impairment Barriers to Discharge:  Continued Medical Work up   [employer]mont MiningJoyce S Sheryl Towell, LCSW 12/06/2015, 10:35 AM

## 2015-12-06 NOTE — Care Management Note (Addendum)
Case Management Note  Patient Details  Name: Drue NovelDon S Zeigler MRN: [id number] Date of Birth: 1941-04-02  Subjective/Objective:                    Action/Plan:  Pt states he is independent other than home O2 with adult pediatric services.     Expected Discharge Date:                  Expected Discharge Plan:  Home/Self Care  In-House Referral:     Discharge planning Services  CM Consult  Post Acute Care Choice:  Durable Medical Equipment, Resumption of Svcs/PTA Provider Choice offered to:  Patient  DME Arranged:  Oxygen DME Agency:  [facility]  HH Arranged:    HH Agency:     Status of Service:  In process, will continue to follow  Medicare Important Message Given:  Yes Date Medicare IM Given:    Medicare IM give by:    Date Additional Medicare IM Given:    Additional Medicare Important Message give by:     If discussed at Long Length of Stay Meetings, dates discussed:    Additional Comments:  Cherylann ParrClaxton, Ria Redcay S, RN 12/06/2015, 9:03 PM

## 2015-12-06 NOTE — Consult Note (Signed)
   [facility]   12/06/2015  Stephen NovelDon [name] Jul 03, 1941 [id number] [facility]. Admitted with hip fracture with HX of COPD. [facility] through his [employer] Medicare plan.  Patient is currently per EPIC notes has a plan for a skilled facility post hospital care. Patient was asleep when came by to see him, did not disturb him at this time. Will follow up with inpatient care management team.  For questions, please contact: Charlesetta ShanksVictoria Rhyker Silversmith, RN BSN CCM [facility] Liaison  [phone] business mobile phone Toll free office [phone]

## 2015-12-06 NOTE — Care Management Important Message (Signed)
Important Message  Patient Details  Name: Stephen [name] MRN: [id number] Date of Birth: 23-May-1941   Medicare Important Message Given:  Yes    Juel Bellerose P Clinten Howk 12/06/2015, 11:59 AM

## 2015-12-07 DIAGNOSIS — D62 Acute posthemorrhagic anemia: Secondary | ICD-10-CM

## 2015-12-07 DIAGNOSIS — S72001A Fracture of unspecified part of neck of right femur, initial encounter for closed fracture: Secondary | ICD-10-CM

## 2015-12-07 LAB — CBC
HCT: 25.9 % — ABNORMAL LOW (ref 39.0–52.0)
Hemoglobin: 8.2 g/dL — ABNORMAL LOW (ref 13.0–17.0)
MCH: 29.8 pg (ref 26.0–34.0)
MCHC: 31.7 g/dL (ref 30.0–36.0)
MCV: 94.2 fL (ref 78.0–100.0)
PLATELETS: 236 10*3/uL (ref 150–400)
RBC: 2.75 MIL/uL — ABNORMAL LOW (ref 4.22–5.81)
RDW: 16.3 % — AB (ref 11.5–15.5)
WBC: 12.5 10*3/uL — ABNORMAL HIGH (ref 4.0–10.5)

## 2015-12-07 LAB — PROTIME-INR
INR: 2.49 — ABNORMAL HIGH (ref 0.00–1.49)
Prothrombin Time: 26.6 seconds — ABNORMAL HIGH (ref 11.6–15.2)

## 2015-12-07 MED ORDER — TAMSULOSIN HCL 0.4 MG PO CAPS
0.4000 mg | ORAL_CAPSULE | Freq: Two times a day (BID) | ORAL | Status: DC
  Administered 2015-12-07 – 2015-12-17 (×22): 0.4 mg via ORAL
  Filled 2015-12-07 (×23): qty 1

## 2015-12-07 MED ORDER — SENNOSIDES-DOCUSATE SODIUM 8.6-50 MG PO TABS
2.0000 | ORAL_TABLET | Freq: Two times a day (BID) | ORAL | Status: DC
  Administered 2015-12-07 (×2): 2 via ORAL
  Filled 2015-12-07 (×3): qty 2

## 2015-12-07 MED ORDER — DILTIAZEM HCL ER COATED BEADS 120 MG PO CP24
120.0000 mg | ORAL_CAPSULE | Freq: Every day | ORAL | Status: DC
  Administered 2015-12-07 – 2015-12-08 (×2): 120 mg via ORAL
  Filled 2015-12-07 (×4): qty 1

## 2015-12-07 MED ORDER — WARFARIN SODIUM 1 MG PO TABS
1.0000 mg | ORAL_TABLET | Freq: Once | ORAL | Status: AC
  Administered 2015-12-07: 1 mg via ORAL
  Filled 2015-12-07: qty 1
  Filled 2015-12-07: qty 0.5

## 2015-12-07 MED ORDER — HYDROCOD POLST-CPM POLST ER 10-8 MG/5ML PO SUER
5.0000 mL | Freq: Once | ORAL | Status: AC
  Administered 2015-12-07: 5 mL via ORAL
  Filled 2015-12-07: qty 5

## 2015-12-07 NOTE — Progress Notes (Signed)
Physical Therapy Treatment Patient Details Name: Stephen [name] MRN: [id number] DOB: August 17, 1941 Today's Date: 12/07/2015    History of Present Illness Patient is a 75 y/o male with hx of A-fib,. COPD, HTN, asthma and neuromuscular disorder presents s/p fall, now s/p IM nail right femur.    PT Comments    Patient seen for mobility progression. Patient remains limited with overall function as well as activity tolerance. Patient tolerated EOB activity but desaturated to 86% EOB, during transfer patient dropped to 79% requiring increased supplemental O2 to improve to 96% with cues for pursed lip breathing and nasal inhalation. Patient noted to have increased anxiety during mobility. Could not tolerate ambulation this session. Current POC remains appropriate.   Follow Up Recommendations  SNF;Supervision/Assistance - 24 hour     Equipment Recommendations  Rolling walker with 5" wheels    Recommendations for Other Services       Precautions / Restrictions Precautions Precautions: Fall Precaution Comments: watch 02 Restrictions Weight Bearing Restrictions: Yes RLE Weight Bearing: Partial weight bearing RLE Partial Weight Bearing Percentage or Pounds: 10% Other Position/Activity Restrictions: taught pt non weight bearing    Mobility  Bed Mobility Overal bed mobility: Needs Assistance Bed Mobility: Supine to Sit     Supine to sit: Min assist     General bed mobility comments: min assist for RLE to bring to EOB.  Transfers Overall transfer level: Needs assistance Equipment used: Rolling walker (2 wheeled) Transfers: Sit to/from [employer] Sit to Stand: Min assist;+2 physical assistance Stand pivot transfers: Min assist;+2 physical assistance       General transfer comment: Patient became extremely anxious during OOB mobility. VCs for hand placement and positioning. Assist during power up with VCs for NWBing RLE during transitional movement. Attempted to  hop/pivot to chair. increased assist dur to anxiety for this task.  Ambulation/Gait             General Gait Details: attempted hop to step, patient with difficulty performed or tolerating safely, convert to pivot to chair   Stairs            Wheelchair Mobility    Modified Rankin (Stroke Patients Only)       Balance     Sitting balance-Leahy Scale: Fair Sitting balance - Comments: Increased DOE with coming to EOB. Sat for ~5 minutes to improveme breathing and decrease HR (O2 saturations to 86% and HR 110s coming to EOB)   Standing balance support: During functional activity Standing balance-Leahy Scale: Poor                      Cognition Arousal/Alertness: Awake/alert Behavior During Therapy: Anxious Overall Cognitive Status: Within Functional Limits for tasks assessed                      Exercises      General Comments        Pertinent Vitals/Pain Pain Assessment: 0-10 Pain Score: 4  Pain Location: right hip  Pain Descriptors / Indicators: Aching;Grimacing;Guarding;Sore;Operative site guarding    Home Living                      Prior Function            PT Goals (current goals can now be found in the care plan section) Acute Rehab PT Goals Patient Stated Goal: none stated PT Goal Formulation: With patient Time For Goal Achievement: 12/20/15 Potential to Achieve Goals:  Fair Progress towards PT goals: Progressing toward goals (modestly (limited by activity tolerance and anxiety))    Frequency  Min 3X/week    PT Plan Current plan remains appropriate    Co-evaluation             End of Session Equipment Utilized During Treatment: Gait belt;Oxygen Activity Tolerance: Patient limited by pain;Treatment limited secondary to medical complications (Comment) (drop in Sp02 with activity 86% EOB 79% transfer) Patient left: in chair;with call bell/phone within reach;with chair alarm set     Time: 0947-1006 PT  Time Calculation (min) (ACUTE ONLY): 19 min  Charges:  $Therapeutic Activity: 8-22 mins                    G CodesFabio [name]:      Stephen [name] 12/07/2015, 11:14 AM Stephen [name], PT DPT  [phone]

## 2015-12-07 NOTE — Clinical Social Work Note (Signed)
CSW spoke with patient regarding bed offers. Patient has chosen [facility].  CSW has confirmed bed availability with SNF who confirms readiness for patient.  Patient has [employer] insurance and requires an authorization prior to discharge.  Patient states he is unsure if he will need hospital transportation as his granddaughter or daughter may be able to transport him.  Of note, patient is on oxygen (home) and has his own portable tank at bedside eliminates that barrier to family transporting.  At this time, there is no projected discharge date.  CSW will continue to follow along and assist.    [facility]   Vickii PennaGina Sherlie Boyum, LCSW [phone]  5N1-9; 2S 15-16 and Hospital [facility] Line Licensed Clinical Social Worker

## 2015-12-07 NOTE — Progress Notes (Signed)
ANTICOAGULATION CONSULT NOTE - Follow Up Consult  Pharmacy Consult for warfarin Indication: atrial fibrillation  Allergies  Allergen Reactions  . Penicillins Other (See Comments)    REACTION: feels drunk    Patient Measurements: Height: 5\' 5"  (165.1 cm) Weight: 128 lb 12.8 oz (58.423 kg) (scale C) IBW/kg (Calculated) : 61.5  Vital Signs: Temp: 97.6 F (36.4 C) (03/22 0601) Temp Source: Axillary (03/22 0601) BP: 155/78 mmHg (03/22 1057) Pulse Rate: 93 (03/22 1057)  Labs:  Recent Labs  12/05/15 0430 12/06/15 0329 12/07/15 0430  HGB 8.6* 8.8* 8.2*  HCT 27.2* 28.3* 25.9*  PLT 188 216 236  LABPROT 20.6* 20.1* 26.6*  INR 1.77* 1.71* 2.49*  CREATININE 1.79* 1.87*  --     Estimated Creatinine Clearance: 28.2 mL/min (by C-G formula based on Cr of 1.87).   Assessment: [age] YOM s/p R femoral nailing for subtrochanteric fracture (OR on 3/18) on warfarin for VTE prophylaxis (on warfarin PTA for AFib- home dose 2.5mg  daily except 5mg  on Mondays and Fridays- last dose PTA 3/17).   S/p 1mg  IV vitamin K on 3/18. INR increased rapidly 1.71>2.49.  Hgb dropped slightly, but stable overall post-op. Plts stable. No bleeding noted.  Goal of Therapy:  INR 2-3 Monitor platelets by anticoagulation protocol: Yes   Plan:  - Warfarin 1mg  PO tonight with rapid increase - Daily INR - Monitor for s/sx of bleeding  Shardai Star D. Barnie Sopko, PharmD, BCPS Clinical Pharmacist Pager: [phone] 12/07/2015 11:05 AM

## 2015-12-07 NOTE — Progress Notes (Signed)
[facility] PROGRESS NOTE  Stephen [name] RUE:[id number] DOB: 1941-03-26 DOA: 11/23/2015 PCP: Rogelia Boga, MD  Assessment/Plan: 75 year old man with severe COPD, prednisone and oxygen dependent, Atrial fibrillation on Coumadin, hypertension. He was admitted on 11/27/2015 with a right hip fracture after mechanical fall. He went to the operating room on 3/19 for hip repair. In the PACU he was extubated with some difficulty but then continued to have significant increased work of breathing. PCCM asked to evaluate. Patient was started on BIPAP for increase work of breathing and nebulizer treatments. His respiratory failure stabilized and he was transfer to [facility].  75 year old man with severe COPD with acute on chronic respiratory failure following right hip surgery  Acute on chronic respiratory failure without any current wheezing, on home oxgyen 2liters Change bronchodilators to DuoNeb Prednisone 50 mg PO daily x5 days then change to home dose of 10 mg daily. Patient report he is not on steroids chronically, will try to wean Oxygen saturation 92 % on 3 L. Wean to baseline  Acute blood loss anemia;  Post surgery. Received ffp on 3/19 Hb stable today at 8.6. If continue to drop, patient will need Blood transfusion.   Leukocytosis.  On steroids.  UA negative.  Check chest x ray. Monitor for fever.   Right Subtrochanteric femur fracture:  S/P open reduction and internal fixation by Dr August Saucer 3-19. PT per ortho.   Paroxysmal Atrial fibrillation Continue Coumadin per pharmacy for a-fib. Monitor Hb/INR Continue amiodarone, restart diltiazem fron 3/22, now that bp start to be elevated  Chronic renal insufficiency stage III: Cr per records 1.6  Continue to monitor labs.   gentle hydration.   Umbilical hernia  Planned follow-up with [facility] as an outpatient hernia chronically incarcerated with omentum Evaluated by Dr Magnus Ivan during this admission. Plan to follow up  outpatient.   Hypothyroidism Continue Synthroid  Constipation; bowel regimen. Received enema, awaiting result  BPH, restart flomax   Code Status: Full Code.  Family Communication: care discussed with patient.  Disposition Plan: SNf in 24-48hrs   Consultants:  Ortho, Dr August Saucer.   Procedures:  Hip sx  Antibiotics:  none  HPI/Subjective: Sitting in chair, on 4liter, denies pain, reports being constipated   Objective: Filed Vitals:   12/07/15 1057 12/07/15 1223  BP: 155/78 177/91  Pulse: 93 90  Temp:  99.3 F (37.4 C)  Resp:  20    Intake/Output Summary (Last 24 hours) at 12/07/15 1304 Last data filed at 12/07/15 0944  Gross per 24 hour  Intake   1000 ml  Output   1100 ml  Net   -100 ml   Filed Weights   12/05/15 1614 12/06/15 0441 12/07/15 0243  Weight: 59.5 kg (131 lb 2.8 oz) 61.598 kg (135 lb 12.8 oz) 58.423 kg (128 lb 12.8 oz)    Exam:   General:  Alert in no distress, frail  Cardiovascular: S 1, S 2 RRR  Respiratory: Bilateral air movement limited, possible baseline , no wheezing.   Abdomen: BS present, soft, nt  Musculoskeletal: no edema , post op changes left hip  Data Reviewed: Basic Metabolic Panel:  Recent Labs Lab 12/04/2015 1622 12/11/2015 0446 12/05/15 0430 12/06/15 0329  NA 142 144 142 141  K 4.9 4.9 4.6 4.6  CL 108 110 111 109  CO2 21* GLUCOSE 126* 125* 118* 125*  BUN 23* 23* 25* 29*  CREATININE 1.83* 1.79* 1.79* 1.87*  CALCIUM 8.6* 8.4* 7.5* 8.0*  MG  --   --   --  2.2  PHOS  --   --   --  3.1   Liver Function Tests:  Recent Labs Lab 11/20/2015 1622  AST 22  ALT 20  ALKPHOS 84  BILITOT 0.6  PROT 5.4*  ALBUMIN 3.3*   No results for input(s): LIPASE, AMYLASE in the last 168 hours. No results for input(s): AMMONIA in the last 168 hours. CBC:  Recent Labs Lab 12/02/2015 1622 2015-12-22 0446 12/05/15 0430 12/06/15 0329 12/07/15 0430  WBC 10.4 14.3* 15.1* 16.3* 12.5*  NEUTROABS 8.8* 12.0*  --   --   --    HGB 13.0 11.7* 8.6* 8.8* 8.2*  HCT 39.2 35.5* 27.2* 28.3* 25.9*  MCV 92.9 91.0 94.1 94.6 94.2  PLT 241 246 188 216 236   Cardiac Enzymes: No results for input(s): CKTOTAL, CKMB, CKMBINDEX, TROPONINI in the last 168 hours. BNP (last 3 results) No results for input(s): BNP in the last 8760 hours.  ProBNP (last 3 results) No results for input(s): PROBNP in the last 8760 hours.  CBG:  Recent Labs Lab 12/06/15 1213  GLUCAP 149*    Recent Results (from the past 240 hour(s))  Surgical pcr screen     Status: None   Collection Time: 22-Dec-2015  1:12 AM  Result Value Ref Range Status   MRSA, PCR NEGATIVE NEGATIVE Final   Staphylococcus aureus NEGATIVE NEGATIVE Final    Comment:        The Xpert SA Assay (FDA approved for NASAL specimens in patients over 59 years of age), is one component of a comprehensive surveillance program.  Test performance has been validated by [facility] for patients greater than or equal to 27 year old. It is not intended to diagnose infection nor to guide or monitor treatment.      Studies: Dg Chest 2 View  12/06/2015  CLINICAL DATA:  Shortness of breath and leukocytosis for 1 day. EXAM: CHEST  2 VIEW COMPARISON:  Chest x-rays dated 12/06/2015 08/14/2011. FINDINGS: Given the oblique patient positioning, cardiomediastinal silhouette is felt to be stable in size and configuration. Prominence of the right upper mediastinum likely accentuated by the patient positioning but could represent inferior extension of a thyroid mass or goiter. Lungs are at least mildly hyperexpanded suggesting COPD. Areas of chronic scarring/atelectasis/fibrosis noted within each lung without significant interval change. No new lung findings. No evidence of pneumonia. No pleural effusion. Osseous structures are diffusely osteopenic. Multiple chronic -appearing compression fracture deformities are seen within the slightly kyphotic thoracic spine. No acute- appearing osseous abnormality  identified. IMPRESSION: 1. Hyperexpanded lungs suggesting COPD. 2. No evidence of acute cardiopulmonary abnormality. No evidence of pneumonia. Areas of chronic scarring/fibrosis/atelectasis within each lung. 3. Stable cardiomediastinal silhouette. 4. Prominence of the right upper mediastinum likely accentuated by the oblique patient positioning but may represent inferior extension of a right thyroid lobe mass/goiter. Would consider confirmation with chest CT. Electronically Signed   By: Bary Richard M.D.   On: 12/06/2015 15:58    Scheduled Meds: . amiodarone  200 mg Oral Daily  . levalbuterol  1.25 mg Nebulization TID  . levothyroxine  75 mcg Oral QAC breakfast  . mometasone-formoterol  2 puff Inhalation BID  . pantoprazole  40 mg Oral QHS  . polyethylene glycol  17 g Oral BID  . predniSONE  50 mg Oral Q breakfast  . warfarin  1 mg Oral ONCE-1800  . Warfarin - Pharmacist Dosing Inpatient   Does not apply q1800   Continuous Infusions: . sodium chloride 75 mL/hr  at 12/07/15 0408    Active Problems:   ATRIAL FIBRILLATION, PAROXYSMAL   Long term current use of anticoagulant therapy   Hip fracture (HCC)   Fracture   Acute on chronic respiratory failure (HCC)   Postoperative anemia due to acute blood loss    Time spent: 35 minutes.     Tarik Teixeira MD phD  [facility] Pager [phone]. If 7PM-7AM, please contact night-coverage at [email or web address], password [email or web address] 12/07/2015, 1:04 PM  LOS: 4 days

## 2015-12-07 NOTE — Progress Notes (Signed)
Pt with minimal UOP via condom cath since foley removed yesterday. Bladder scan showed 715 mL urine. I & O cath with 750 mL output at 0700. Will continue to monitor.

## 2015-12-08 ENCOUNTER — Inpatient Hospital Stay (HOSPITAL_COMMUNITY): Payer: PPO

## 2015-12-08 LAB — BASIC METABOLIC PANEL
Anion gap: 10 (ref 5–15)
BUN: 27 mg/dL — AB (ref 6–20)
CHLORIDE: 106 mmol/L (ref 101–111)
CO2: 24 mmol/L (ref 22–32)
CREATININE: 1.27 mg/dL — AB (ref 0.61–1.24)
Calcium: 8.4 mg/dL — ABNORMAL LOW (ref 8.9–10.3)
GFR calc Af Amer: >60 mL/min (ref >60)
GFR calc non Af Amer: 54 mL/min — ABNORMAL LOW (ref >60)
Glucose, Bld: 116 mg/dL — ABNORMAL HIGH (ref 65–99)
POTASSIUM: 4.9 mmol/L (ref 3.5–5.1)
Sodium: 140 mmol/L (ref 135–145)

## 2015-12-08 LAB — CBC
HCT: 26.6 % — ABNORMAL LOW (ref 39.0–52.0)
Hemoglobin: 8.7 g/dL — ABNORMAL LOW (ref 13.0–17.0)
MCH: 30.6 pg (ref 26.0–34.0)
MCHC: 32.7 g/dL (ref 30.0–36.0)
MCV: 93.7 fL (ref 78.0–100.0)
Platelets: 282 10*3/uL (ref 150–400)
RBC: 2.84 MIL/uL — ABNORMAL LOW (ref 4.22–5.81)
RDW: 16.2 % — ABNORMAL HIGH (ref 11.5–15.5)
WBC: 16 10*3/uL — ABNORMAL HIGH (ref 4.0–10.5)

## 2015-12-08 LAB — PROTIME-INR
INR: 2.63 — ABNORMAL HIGH (ref 0.00–1.49)
Prothrombin Time: 27.7 seconds — ABNORMAL HIGH (ref 11.6–15.2)

## 2015-12-08 LAB — TSH: TSH: 0.154 u[IU]/mL — ABNORMAL LOW (ref 0.350–4.500)

## 2015-12-08 MED ORDER — WARFARIN SODIUM 2.5 MG PO TABS
2.5000 mg | ORAL_TABLET | Freq: Once | ORAL | Status: AC
  Administered 2015-12-08: 2.5 mg via ORAL
  Filled 2015-12-08: qty 1

## 2015-12-08 MED ORDER — DILTIAZEM HCL ER COATED BEADS 120 MG PO CP24
120.0000 mg | ORAL_CAPSULE | Freq: Two times a day (BID) | ORAL | Status: DC
  Administered 2015-12-09: 120 mg via ORAL
  Filled 2015-12-08 (×2): qty 1

## 2015-12-08 MED ORDER — METOPROLOL TARTRATE 1 MG/ML IV SOLN
2.5000 mg | Freq: Once | INTRAVENOUS | Status: AC
  Administered 2015-12-08: 2.5 mg via INTRAVENOUS
  Filled 2015-12-08: qty 5

## 2015-12-08 MED ORDER — PREDNISONE 20 MG PO TABS
40.0000 mg | ORAL_TABLET | Freq: Every day | ORAL | Status: DC
  Filled 2015-12-08: qty 2

## 2015-12-08 MED ORDER — PREDNISONE 10 MG (21) PO TBPK: 10.0000 mg | ORAL_TABLET | Freq: Every day | ORAL | Status: AC

## 2015-12-08 MED ORDER — METOPROLOL TARTRATE 1 MG/ML IV SOLN
5.0000 mg | Freq: Once | INTRAVENOUS | Status: AC
  Administered 2015-12-08: 5 mg via INTRAVENOUS
  Filled 2015-12-08: qty 5

## 2015-12-08 NOTE — Progress Notes (Signed)
Pt's daughter will be unable to transport pt to SNF today as she is going to be dropped off at the hospital. Pt's daughter requests to ride in the ambulance [facility] the [facility] discharge. Will continue to monitor.

## 2015-12-08 NOTE — Discharge Instructions (Signed)

## 2015-12-08 NOTE — Progress Notes (Signed)
ANTICOAGULATION CONSULT NOTE - Follow Up Consult  Pharmacy Consult for warfarin Indication: atrial fibrillation  Allergies  Allergen Reactions  . Penicillins Other (See Comments)    REACTION: feels drunk    Patient Measurements: Height: 5\' 5"  (165.1 cm) Weight: 130 lb 4.8 oz (59.104 kg) (Scale C) IBW/kg (Calculated) : 61.5  Vital Signs: Temp: 97.9 F (36.6 C) (03/23 0419) Temp Source: Oral (03/23 0419) BP: 128/70 mmHg (03/23 1000) Pulse Rate: 132 (03/23 0502)  Labs:  Recent Labs  12/06/15 0329 12/07/15 0430 12/08/15 0640  HGB 8.8* 8.2* 8.7*  HCT 28.3* 25.9* 26.6*  PLT 216 236 282  LABPROT 20.1* 26.6* 27.7*  INR 1.71* 2.49* 2.63*  CREATININE 1.87*  --  1.27*    Estimated Creatinine Clearance: 42 mL/min (by C-G formula based on Cr of 1.27).   Assessment: [age] YOM s/p R femoral nailing for subtrochanteric fracture (OR on 3/18) on warfarin for VTE prophylaxis (on warfarin PTA for AFib- home dose 2.5mg  daily except 5mg  on Mondays and Fridays- last dose PTA 3/17).   S/p 1mg  IV vitamin K on 3/18. INR increased rapidly 1.71>2.49. Now more steady at 2.63.  Hgb stable overall post-op. Plts stable. No bleeding noted.  Goal of Therapy:  INR 2-3 Monitor platelets by anticoagulation protocol: Yes   Plan:  - Warfarin 2.5mg  PO tonight as per home dose - Daily INR - Monitor for s/sx of bleeding  King Pinzon D. Jshaun Abernathy, PharmD, BCPS Clinical Pharmacist Pager: [phone] 12/08/2015 11:26 AM

## 2015-12-08 NOTE — Progress Notes (Signed)
HR sustaining 120s-130s in A fib. Pt with hx of A fib. MD paged. Orders received for Metoprolol 5mg  IV x1. HR now 104. Will continue to monitor.

## 2015-12-08 NOTE — Progress Notes (Addendum)
[facility] PROGRESS NOTE  Stephen [name] RUE:[id number] DOB: [birth year] DOA: Dec 26, 2015 PCP: Rogelia Boga, MD  Assessment/Plan: 75 year old man with severe COPD, prednisone and oxygen dependent, Atrial fibrillation on Coumadin, hypertension. He was admitted on 12/26/2015 with a right hip fracture after mechanical fall. He went to the operating room on 3/19 for hip repair. In the PACU he was extubated with some difficulty but then continued to have significant increased work of breathing. PCCM asked to evaluate. Patient was started on BIPAP for increase work of breathing and nebulizer treatments. His respiratory failure stabilized and he was transfer to [facility].  75 year old man with severe COPD with acute on chronic respiratory failure following right hip surgery  Acute on chronic respiratory failure without any current wheezing, on home oxgyen 2liters Change bronchodilators to [facility]  Patient report he is not on steroids chronically, will try to wean prednisone off Oxygen saturation 92 % on 3 L. Wean to baseline  Acute blood loss anemia;  Post surgery. Received ffp on 3/19 Hb stable today at 8.6. If continue to drop, patient will need Blood transfusion.  Stool occult blood test pending collection  Leukocytosis.  On steroids.  UA negative.  chest x ray unremarkable. no fever.  Taper steroids  Right Subtrochanteric femur fracture:  S/P open reduction and internal fixation by Dr August Saucer 3-19. PT per ortho. Dr August Saucer  Paroxysmal Atrial fibrillation Continue Coumadin per pharmacy for a-fib. Monitor Hgb/INR Continue amiodarone,  diltiazem restarted from 3/22, Patient developed afib/RVR required lopressor iv  at 5am and 2.5mg  iv at 9;30am, cardiology consulted for afib/rvr on 3/23.  Chronic renal insufficiency stage III: Cr per records 1.6  Continue to monitor labs.   S/p gentle hydration.  Cr improving  Umbilical hernia  Planned follow-up with [facility] as an  outpatient hernia chronically incarcerated with omentum Evaluated by Dr Magnus Ivan during this admission. Plan to follow up outpatient.   Hypothyroidism Continue Synthroid tsh suppressed, d/c synthroid. cxr on 3/21 ? Extension of a right rhyroid lobe mass/goiter, Ct chest was recommended and ordered  Constipation; bowel regimen. Received enema, awaiting result  BPH, restart flomax   Code Status: Full Code.  Family Communication: care discussed with patient and his  Daughter [place]  in room Disposition Plan: SNf in 24-48hrs if heart rate better controlled, ct chest pending, stool occult blood test pending   Consultants:  Ortho, Dr August Saucer.   Cardiology for afib/rvr  General surgery Dr Janee Morn for umbilical hernia  [facility] admit on 3/18, transfer to critical care post op on 3/19 due to needing bipap, transferred back to [facility] on 3/21  Procedures: Right subtrochanteric femur fracture, open reduction and internal fixation on 3/19 by Dr Dorene Grebe  Antibiotics:  none  HPI/Subjective: Sitting in chair, on 4liter, in afib/rvr, but does not seem in acute distress, denies chest pain, daughter in room  Objective: Filed Vitals:   12/08/15 1140 12/08/15 1208  BP: 112/76   Pulse: 107 132  Temp: 98.6 F (37 C)   Resp: 18     Intake/Output Summary (Last 24 hours) at 12/08/15 1450 Last data filed at 12/08/15 1400  Gross per 24 hour  Intake    480 ml  Output   2326 ml  Net  -1846 ml   Filed Weights   12/06/15 0441 12/07/15 0243 12/08/15 0419  Weight: 61.598 kg (135 lb 12.8 oz) 58.423 kg (128 lb 12.8 oz) 59.104 kg (130 lb 4.8 oz)    Exam:   General:  Alert in  no distress, frail  Cardiovascular: S 1, S 2 IRRR, in afib/rvr  Respiratory: Bilateral air movement limited, possible baseline , no wheezing.   Abdomen: BS present, soft, nt  Musculoskeletal: no edema , post op changes right hip  Data Reviewed: Basic Metabolic Panel:  Recent Labs Lab 12/01/2015 1622  11/18/2015 0446 12/05/15 0430 12/06/15 0329 12/08/15 0640  NA 142 144 142 141 140  K 4.9 4.9 4.6 4.6 4.9  CL 108 110 111 109 106  CO2 21* GLUCOSE 126* 125* 118* 125* 116*  BUN 23* 23* 25* 29* 27*  CREATININE 1.83* 1.79* 1.79* 1.87* 1.27*  CALCIUM 8.6* 8.4* 7.5* 8.0* 8.4*  MG  --   --   --  2.2  --   PHOS  --   --   --  3.1  --    Liver Function Tests:  Recent Labs Lab 12/14/2015 1622  AST 22  ALT 20  ALKPHOS 84  BILITOT 0.6  PROT 5.4*  ALBUMIN 3.3*   No results for input(s): LIPASE, AMYLASE in the last 168 hours. No results for input(s): AMMONIA in the last 168 hours. CBC:  Recent Labs Lab 12/02/2015 1622 11/27/2015 0446 12/05/15 0430 12/06/15 0329 12/07/15 0430 12/08/15 0640  WBC 10.4 14.3* 15.1* 16.3* 12.5* 16.0*  NEUTROABS 8.8* 12.0*  --   --   --   --   HGB 13.0 11.7* 8.6* 8.8* 8.2* 8.7*  HCT 39.2 35.5* 27.2* 28.3* 25.9* 26.6*  MCV 92.9 91.0 94.1 94.6 94.2 93.7  PLT 241 246 188 216 236 282   Cardiac Enzymes: No results for input(s): CKTOTAL, CKMB, CKMBINDEX, TROPONINI in the last 168 hours. BNP (last 3 results) No results for input(s): BNP in the last 8760 hours.  ProBNP (last 3 results) No results for input(s): PROBNP in the last 8760 hours.  CBG:  Recent Labs Lab 12/06/15 1213  GLUCAP 149*    Recent Results (from the past 240 hour(s))  Surgical pcr screen     Status: None   Collection Time: 11/22/2015  1:12 AM  Result Value Ref Range Status   MRSA, PCR NEGATIVE NEGATIVE Final   Staphylococcus aureus NEGATIVE NEGATIVE Final    Comment:        The Xpert SA Assay (FDA approved for NASAL specimens in patients over 33 years of age), is one component of a comprehensive surveillance program.  Test performance has been validated by [facility] for patients greater than or equal to 71 year old. It is not intended to diagnose infection nor to guide or monitor treatment.      Studies: Dg Chest 2 View  12/06/2015  CLINICAL DATA:   Shortness of breath and leukocytosis for 1 day. EXAM: CHEST  2 VIEW COMPARISON:  Chest x-rays dated 12/08/2015 08/14/2011. FINDINGS: Given the oblique patient positioning, cardiomediastinal silhouette is felt to be stable in size and configuration. Prominence of the right upper mediastinum likely accentuated by the patient positioning but could represent inferior extension of a thyroid mass or goiter. Lungs are at least mildly hyperexpanded suggesting COPD. Areas of chronic scarring/atelectasis/fibrosis noted within each lung without significant interval change. No new lung findings. No evidence of pneumonia. No pleural effusion. Osseous structures are diffusely osteopenic. Multiple chronic -appearing compression fracture deformities are seen within the slightly kyphotic thoracic spine. No acute- appearing osseous abnormality identified. IMPRESSION: 1. Hyperexpanded lungs suggesting COPD. 2. No evidence of acute cardiopulmonary abnormality. No evidence of pneumonia. Areas of chronic scarring/fibrosis/atelectasis within each  lung. 3. Stable cardiomediastinal silhouette. 4. Prominence of the right upper mediastinum likely accentuated by the oblique patient positioning but may represent inferior extension of a right thyroid lobe mass/goiter. Would consider confirmation with chest CT. Electronically Signed   By: Bary RichardStan  Maynard M.D.   On: 12/06/2015 15:58    Scheduled Meds: . amiodarone  200 mg Oral Daily  . diltiazem  120 mg Oral Daily  . levalbuterol  1.25 mg Nebulization TID  . levothyroxine  75 mcg Oral QAC breakfast  . mometasone-formoterol  2 puff Inhalation BID  . pantoprazole  40 mg Oral QHS  . predniSONE  50 mg Oral Q breakfast  . tamsulosin  0.4 mg Oral BID  . warfarin  2.5 mg Oral ONCE-1800  . Warfarin - Pharmacist Dosing Inpatient   Does not apply q1800   Continuous Infusions:    Active Problems:   ATRIAL FIBRILLATION, PAROXYSMAL   Long term current use of anticoagulant therapy   Hip  fracture (HCC)   Fracture   Acute on chronic respiratory failure (HCC)   Postoperative anemia due to acute blood loss    Time spent: 35 minutes.     Lan Entsminger MD PhD  [facility] Pager [phone]. If 7PM-7AM, please contact night-coverage at [email or web address], password [email or web address] 12/08/2015, 2:50 PM  LOS: 5 days

## 2015-12-08 NOTE — Consult Note (Signed)
CARDIOLOGY CONSULT NOTE   Patient ID: Stephen [name] MRN: [id number] DOB/AGE: 76-09-1940 76 y.o.  Admit date: 01-Jan-2016  Requesting Physician: Dr. Roda Shutters Primary Physician   Rogelia Boga, MD Primary Cardiologist   Dr. Jens Som Reason for Consultation   afib with RVR   HPI:  Stephen [name] is a 75 y.o. male with a history of PAF on amio and coumadin, severe COPD on home o2, hypothyroidism and HTN who was admitted to [facility] on 01/01/2016 for hip fracture after a mechanical fall. He underwent ORIF on 12/07/2015. In the PACU he was extubated with some difficulty but then continued to have significant increased work of breathing. PCCM asked to evaluate. Patient was started on BIPAP for increase work of breathing and nebulizer treatments. His respiratory failure stabilized and he was [facility] on [facility]. He was noted to go into afib with RVR on 12/07/15 PM and cardiology consulted for help with atrial fibrillation management.   He was admitted in 2012 an acute COPD flare. Patient was diagnosed with atrial fibrillation during this admission and placed on amiodarone and Coumadin. Echocardiogram at that time showed normal LV function and a trivial pericardial effusion. He saw Dr. Jens Som in 12/2010 for outpatient office follow up. He decreased his amiodarone to daily maintenance therapy at 200mg  daily. He was continued on Coumadin for a CHADSVASC score of 2 (HTN, age 47). He was lost to follow up from our office but has been maintained on amiodarone and coumadin as well as Diltiazem 120mg  daily followed by his PCP.   He was in his usual state of health until 2016/01/01 when he had a mechanical fall resulting in a hip fracture. Coumadin was held and he was given Vitamin K. He underwent successful ORIF on 12/03/2015 and was restarted on coumadin. Diltiazem CD 120mg  restarted on 12/06/15. He was continued on amiodarone 200mg  daily. In the PACU he was extubated with some difficulty  but then continued to have significant increased work of breathing. PCCM asked to evaluate. Patient was started on BIPAP for increase work of breathing and nebulizer treatments. His respiratory failure stabilized and he was transfer [facility] on 3/21. He was noted to go into afib with RVR on 12/07/15 PM and cardiology consulted for help with atrial fibrillation management.   Currently his HR fluctuate from 110-140s. He is basicially asymptomatic and denies chest pain. He has chronic SOB with his severe COPD which he actually thinks is better today.       Past Medical History  Diagnosis Date  . ASTHMA 04/22/2007  . ATRIAL FIBRILLATION, PAROXYSMAL 05/28/2007  . BENIGN PROSTATIC HYPERTROPHY 04/20/2008  . COPD 05/28/2007  . HYPERTENSION 04/22/2007  . HYPOTENSION 10/10/2010  . 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 Complication of anesthesia     difficulty waking up  . On home oxygen therapy     intermittent, uses with increased activity  . Shortness of breath   . Neuromuscular disorder (HCC)   . Arthritis   . Peroneal neuropathy     L      Past Surgical History  Procedure Laterality Date  . Tonsillectomy    . Colonoscopy    . Intramedullary (im) nail intertrochanteric Right 12/11/2015    Procedure: INTRAMEDULLARY (IM) NAIL INTERTROCHANTRIC;  Surgeon: Cammy Copa, MD;  Location: [facility] OR;  Service: Orthopedics;  Laterality: Right;    Allergies  Allergen Reactions  . Penicillins Other (See Comments)  REACTION: feels drunk    I have reviewed the patient's current medications . amiodarone  200 mg Oral Daily  . diltiazem  120 mg Oral Daily  . levalbuterol  1.25 mg Nebulization TID  . levothyroxine  75 mcg Oral QAC breakfast  . mometasone-formoterol  2 puff Inhalation BID  . pantoprazole  40 mg Oral QHS  . predniSONE  50 mg Oral Q breakfast  . tamsulosin  0.4 mg Oral BID  . warfarin  2.5 mg Oral ONCE-1800  . Warfarin - Pharmacist Dosing Inpatient    Does not apply q1800     acetaminophen **OR** acetaminophen, bisacodyl, HYDROcodone-acetaminophen, levalbuterol, menthol-cetylpyridinium **OR** phenol, metoCLOPramide **OR** metoCLOPramide (REGLAN) injection, morphine injection, ondansetron **OR** ondansetron (ZOFRAN) IV  Prior to Admission medications   Medication Sig Start Date End Date Taking? Authorizing Provider  ADVAIR DISKUS 250-50 MCG/DOSE AEPB inhale 1 dose by mouth twice a day 08/08/15  Yes Gordy Savers, MD  albuterol (PROVENTIL HFA;VENTOLIN HFA) 108 (90 BASE) MCG/ACT inhaler Inhale 2 puffs into the lungs every 6 (six) hours as needed. For shortness of breath 11/17/10  Yes Gordy Savers, MD  AMBULATORY NON FORMULARY MEDICATION Inhale 2 L into the lungs continuous. Medication Name: oxygen 2 liters   Yes Historical Provider, MD  diltiazem (DILACOR XR) 120 MG 24 hr capsule Take 1 capsule (120 mg total) by mouth daily. 10/24/15  Yes Gordy Savers, MD  levalbuterol [facility]) 1.25 MG/3ML nebulizer solution inhale contents of 1 vial in nebulizer every 6 hours Patient taking differently: inhale contents of 1 vial in nebulizer every 6 hours as needed for SOB 11/12/14  Yes Gordy Savers, MD  levothyroxine (SYNTHROID, LEVOTHROID) 75 MCG tablet take 1 tablet by mouth every morning ON AN EMPTY STOMACH 06/09/15  Yes Gordy Savers, MD  meclizine (ANTIVERT) 25 MG tablet Take 1 tablet (25 mg total) by mouth 4 (four) times daily as needed for dizziness. 10/19/14  Yes Gordy Savers, MD  Multiple Vitamin (MULTIVITAMIN) tablet Take 1 tablet by mouth daily.     Yes Historical Provider, MD  PACERONE 200 MG tablet take 1 tablet by mouth once daily 08/26/15  Yes Gordy Savers, MD  polyethylene glycol [facility] / GLYCOLAX) packet Take 17 g by mouth daily as needed. For constipation   Yes Historical Provider, MD  [facility] HANDIHALER 18 MCG inhalation capsule inhale contents of 1 capsule by mouth once daily 02/09/15  Yes Gordy Savers, MD  tamsulosin [facility]) 0.4 MG CAPS capsule take 1 capsule by mouth twice a day 03/09/15  Yes Gordy Savers, MD  warfarin (COUMADIN) 2.5 MG tablet Take as directed by anticoagulation clinic Patient taking differently: Take 2.5-5 mg by mouth daily at 6 PM. Takes  on Mon and Fri  Takes 2.5mg  all other days 09/27/15  Yes Gordy Savers, MD  doxycycline (VIBRAMYCIN) 100 MG capsule Take 1 capsule (100 mg total) by mouth 2 (two) times daily. 09/09/15   Tharon Aquas, PA  predniSONE (DELTASONE) 10 MG tablet Take 2 tablets (20 mg total) by mouth 2 (two) times daily with a meal. 09/09/15   Tharon Aquas, PA  predniSONE (STERAPRED UNI-PAK 21 TAB) 10 MG (21) TBPK tablet Take 1 tablet (10 mg total) by mouth daily. Label  & dispense according to the schedule below. 4 Pills PO on day one, 3 Pills PO on day 2, 2 Pills PO on day 3, one Pill PO on day 4, then stop. 12/08/15   Parke Poisson  Roda Shutters, MD     Social History   Social History  . Marital Status: Widowed    Spouse Name: N/A  . Number of Children: N/A  . Years of Education: N/A   Occupational History  . Not on file.   Social History Main Topics  . Smoking status: Former Smoker    Quit date: 09/18/1983  . Smokeless tobacco: Never Used  . Alcohol Use: No  . Drug Use: No  . Sexual Activity: Not on file   Other Topics Concern  . Not on file   Social History Narrative    No family status information on file.   History reviewed. No pertinent family history.   ROS:  Full 14 point review of systems complete and found to be negative unless listed above.  Physical Exam: Blood pressure 112/76, pulse 132, temperature 98.6 F (37 C), temperature source Oral, resp. rate 18, height 5\' 5"  (1.651 m), weight 130 lb 4.8 oz (59.104 kg), SpO2 90 %.  General: Well developed, well nourished, male in no acute distress Head: Eyes PERRLA, No xanthomas.   Normocephalic and atraumatic, oropharynx without edema or exudate.  Lungs: decreased  breath sounds mild wheezing Heart: HRRR S1 S2, no rub/gallop, Heart irregular rate and tachy with S1, S2  murmur. pulses are 2+ extrem.   Neck: No carotid bruits. No lymphadenopathy.  no JVD. Abdomen: Bowel sounds present, abdomen soft and non-tender without masses or hernias noted. Msk:  No spine or cva tenderness. No weakness, no joint deformities or effusions. Extremities: No clubbing or cyanosis.  Trace bilateral LE edema.  Neuro: Alert and oriented X 3. No focal deficits noted. Psych:  Good affect, responds appropriately Skin: No rashes or lesions noted.  Labs:   Lab Results  Component Value Date   WBC 16.0* 12/08/2015   HGB 8.7* 12/08/2015   HCT 26.6* 12/08/2015   MCV 93.7 12/08/2015   PLT 282 12/08/2015    Recent Labs  12/08/15 0640  INR 2.63*    Recent Labs Lab Jan 01, 2016 1622  12/08/15 0640  NA 142  < > 140  K 4.9  < > 4.9  CL 108  < > 106  CO2 21*  < > 24  BUN 23*  < > 27*  CREATININE 1.83*  < > 1.27*  CALCIUM 8.6*  < > 8.4*  PROT 5.4*  --   --   BILITOT 0.6  --   --   ALKPHOS 84  --   --   ALT 20  --   --   AST 22  --   --   GLUCOSE 126*  < > 116*  ALBUMIN 3.3*  --   --   < > = values in this interval not displayed. MAGNESIUM  Date Value Ref Range Status  12/06/2015 2.2 1.7 - 2.4 mg/dL Final   No results for input(s): CKTOTAL, CKMB, TROPONINI in the last 72 hours. No results for input(s): TROPIPOC in the last 72 hours. PRO B NATRIURETIC PEPTIDE (BNP)  Date/Time Value Ref Range Status  09/23/2010 11:48 AM <30.0 0.0 - 100.0 pg/mL Final   Lab Results  Component Value Date   CHOL 198 02/18/2014   HDL 64.60 02/18/2014   LDLCALC 123* 02/18/2014   TRIG 52.0 02/18/2014   Lab Results  Component Value Date   DDIMER  12/12/2010    <0.22        AT THE INHOUSE ESTABLISHED CUTOFF VALUE OF 0.48 ug/mL FEU, THIS ASSAY HAS BEEN DOCUMENTED IN  THE LITERATURE TO HAVE A SENSITIVITY AND NEGATIVE PREDICTIVE VALUE OF AT LEAST 98 TO 99%.  THE TEST RESULT SHOULD  BE CORRELATED WITH AN ASSESSMENT OF THE CLINICAL PROBABILITY OF DVT / VTE.   No results found for: LIPASE, AMYLASE TSH  Date/Time Value Ref Range Status  12/08/2015 09:40 AM 0.154* 0.350 - 4.500 uIU/mL Final  07/19/2015 02:26 PM 0.20* 0.35 - 4.50 uIU/mL Final   No results found for: VITAMINB12, FOLATE, FERRITIN, TIBC, IRON, RETICCTPCT  Echo: none  ECG:  HR 82. Inferior q waves.  Radiology:  Dg Chest 2 View  12/06/2015  CLINICAL DATA:  Shortness of breath and leukocytosis for 1 day. EXAM: CHEST  2 VIEW COMPARISON:  Chest x-rays dated 12/02/2015 08/14/2011. FINDINGS: Given the oblique patient positioning, cardiomediastinal silhouette is felt to be stable in size and configuration. Prominence of the right upper mediastinum likely accentuated by the patient positioning but could represent inferior extension of a thyroid mass or goiter. Lungs are at least mildly hyperexpanded suggesting COPD. Areas of chronic scarring/atelectasis/fibrosis noted within each lung without significant interval change. No new lung findings. No evidence of pneumonia. No pleural effusion. Osseous structures are diffusely osteopenic. Multiple chronic -appearing compression fracture deformities are seen within the slightly kyphotic thoracic spine. No acute- appearing osseous abnormality identified. IMPRESSION: 1. Hyperexpanded lungs suggesting COPD. 2. No evidence of acute cardiopulmonary abnormality. No evidence of pneumonia. Areas of chronic scarring/fibrosis/atelectasis within each lung. 3. Stable cardiomediastinal silhouette. 4. Prominence of the right upper mediastinum likely accentuated by the oblique patient positioning but may represent inferior extension of a right thyroid lobe mass/goiter. Would consider confirmation with chest CT. Electronically Signed   By: Bary Richard M.D.   On: 12/06/2015 15:58    ASSESSMENT AND PLAN:    Active Problems:   ATRIAL FIBRILLATION, PAROXYSMAL   Long term current use of  anticoagulant therapy   Hip fracture (HCC)   Fracture   Acute on chronic respiratory failure (HCC)   Postoperative anemia due to acute blood loss   Stephen [name] is a 75 y.o. male with a history of PAF on amio and coumadin, severe COPD on home o2, hypothyroidism and HTN who was admitted to [facility] on 12/05/2015 for hip fracture after a mechanical fall. He underwent ORIF on 12/16/2015. In the PACU he was extubated with some difficulty but then continued to have significant increased work of breathing. PCCM asked to evaluate. Patient was started on BIPAP for increase work of breathing and nebulizer treatments. His respiratory failure stabilized and he was [facility] on [facility]. He was noted to go into afib with RVR on 12/07/15 PM and cardiology consulted for help with atrial fibrillation management.   PAF with afib with RVR: HR currently 110-150s  -- TSH low which could be contributing, but likely due to stress of surgery and respiratory failure after extubation -- Continue amiodarone  daily and diltiazem CD  daily. BP is not really high but can try to increase diltiazem CD  BID. If this does not help or he gets hypotensive, we can increase his amiodarone.  -- CHADSVASC score at least 3 (HTN (1), age >16 (2)). Continue coumadin. INR theraputic now. (he did have 2 days of subtheraputic INR so would need a TEE if he required DCCV)  Hypothyroidism: he is maintained on synthroid therapy. Likely over supplemented. Would hold or decrease   HTN: BPs currently well controlled    COPD: acute exacerbation requiring steroids this admissoin.   AKI:  creat up to 1.7 on admission. Now improved to 1.2   Dispo: he will need follow up with our cardiology office arranged after discharge. He is a Dr. Jens Somrenshaw patient.   SignedJanetta Hora: THOMPSON, KATHRYN R, PA-C 12/08/2015 2:04 PM  Pager [phone]  Co-Sign MD   I have examined the patient and reviewed assessment and plan and discussed  with patient.  Agree with above as stated.  Lilely in AFib due to stress of surgery.  Increase diltiazem to 120 BID.  See if BP can tolerate additional rate control meds. Will follow.   Deirdre Gryder S.

## 2015-12-08 NOTE — Progress Notes (Signed)
Pt dressing on right hip has noted drainage. Reinforced dressing with ABD. Called to make ortho MD aware. Will continue to monitor.

## 2015-12-08 NOTE — Progress Notes (Signed)
Physical Therapy Treatment Patient Details Name: Stephen [name] MRN: [id number] DOB: 12-31-1940 Today's Date: 12/08/2015    History of Present Illness Patient is a 75 y/o male with hx of A-fib,. COPD, HTN, asthma and neuromuscular disorder presents s/p fall, now s/p IM nail right femur.    PT Comments    Patient with small progress with improved bed mobility technique with cues.  Still severely dyspneic and SpO2 dropping to 90% with O2 @ 3LPM needing extended time to recover.  Continue to feel SNF level rehab appropriate for pt prior to d/c home.  Follow Up Recommendations  SNF;Supervision/Assistance - 24 hour     Equipment Recommendations  Rolling walker with 5" wheels    Recommendations for Other Services       Precautions / Restrictions Precautions Precautions: Fall Precaution Comments: watch 02 Restrictions RLE Weight Bearing: Partial weight bearing RLE Partial Weight Bearing Percentage or Pounds: 10% Other Position/Activity Restrictions: taught pt non weight bearing    Mobility  Bed Mobility Overal bed mobility: Needs Assistance Bed Mobility: Supine to Sit     Supine to sit: Mod assist     General bed mobility comments: assist and cues for technique to scoot to EOB then to lift trunk upright  Transfers Overall transfer level: Needs assistance Equipment used: Rolling walker (2 wheeled) Transfers: Sit to/from Stand Sit to Stand: Min assist;+2 safety/equipment Stand pivot transfers: Mod assist       General transfer comment: assist for set up, safety with pivotal "hops" to chair maintaining NWB on R  Ambulation/Gait             General Gait Details: unable to due to dyspnea and weakness   Stairs            Wheelchair Mobility    Modified Rankin (Stroke Patients Only)       Balance Overall balance assessment: Needs assistance Sitting-balance support: Feet supported Sitting balance-Leahy Scale: Fair     Standing balance support:  Bilateral upper extremity supported;During functional activity Standing balance-Leahy Scale: Poor Standing balance comment: UE support needed and min A for balance with NWB R                    Cognition Arousal/Alertness: Awake/alert Behavior During Therapy: WFL for tasks assessed/performed Overall Cognitive Status: Within Functional Limits for tasks assessed                      Exercises Total Joint Exercises Ankle Circles/Pumps: AROM;Both;10 reps;Supine Quad Sets: AROM;Both;10 reps;Supine Heel Slides: AAROM;AROM;Both;10 reps;Supine    General Comments General comments (skin integrity, edema, etc.): bed pad soiled and gown wet with drainage from hip wound through dressing; RN arrived when called and reinforced dressing      Pertinent Vitals/Pain Pain Score: 5  Pain Location: R hip with movement Pain Descriptors / Indicators: Sore;Operative site guarding Pain Intervention(s): Monitored during session;Repositioned;Ice applied    Home Living                      Prior Function            PT Goals (current goals can now be found in the care plan section) Progress towards PT goals: Progressing toward goals    Frequency  Min 3X/week    PT Plan Current plan remains appropriate    Co-evaluation             End of Session Equipment Utilized During Treatment: Gait  belt;Oxygen Activity Tolerance: Patient limited by fatigue Patient left: in chair;with call bell/phone within reach;with chair alarm set     Time: [id number] PT Time Calculation (min) (ACUTE ONLY): 27 min  Charges:  $Therapeutic Exercise: 8-22 mins $Therapeutic Activity: 8-22 mins                    G Codes:      Elray McgregorCynthia Krystyn Picking 12/08/2015, 12:19 PM  Sheran Lawlessyndi Shedrick Sarli, PT [phone] 12/08/2015

## 2015-12-08 NOTE — Progress Notes (Addendum)
Dressing changed Needs new aquacell Ok for dc am Coumadin for dvt prophylaxis norco for pain pwb right leg 50 percent

## 2015-12-09 ENCOUNTER — Inpatient Hospital Stay (HOSPITAL_COMMUNITY): Payer: PPO

## 2015-12-09 ENCOUNTER — Inpatient Hospital Stay (HOSPITAL_COMMUNITY): Payer: PPO | Admitting: Certified Registered"

## 2015-12-09 DIAGNOSIS — I9589 Other hypotension: Secondary | ICD-10-CM

## 2015-12-09 DIAGNOSIS — E039 Hypothyroidism, unspecified: Secondary | ICD-10-CM

## 2015-12-09 DIAGNOSIS — I482 Chronic atrial fibrillation: Secondary | ICD-10-CM

## 2015-12-09 DIAGNOSIS — J9601 Acute respiratory failure with hypoxia: Secondary | ICD-10-CM

## 2015-12-09 LAB — CBC
HCT: 28.5 % — ABNORMAL LOW (ref 39.0–52.0)
Hemoglobin: 8.8 g/dL — ABNORMAL LOW (ref 13.0–17.0)
MCH: 29.1 pg (ref 26.0–34.0)
MCHC: 30.9 g/dL (ref 30.0–36.0)
MCV: 94.4 fL (ref 78.0–100.0)
Platelets: 269 10*3/uL (ref 150–400)
RBC: 3.02 MIL/uL — ABNORMAL LOW (ref 4.22–5.81)
RDW: 16 % — ABNORMAL HIGH (ref 11.5–15.5)
WBC: 15.3 10*3/uL — ABNORMAL HIGH (ref 4.0–10.5)

## 2015-12-09 LAB — BASIC METABOLIC PANEL
Anion gap: 8 (ref 5–15)
BUN: 38 mg/dL — ABNORMAL HIGH (ref 6–20)
CO2: 28 mmol/L (ref 22–32)
Calcium: 8.4 mg/dL — ABNORMAL LOW (ref 8.9–10.3)
Chloride: 105 mmol/L (ref 101–111)
Creatinine, Ser: 1.74 mg/dL — ABNORMAL HIGH (ref 0.61–1.24)
GFR calc Af Amer: 42 mL/min — ABNORMAL LOW (ref >60)
GFR calc non Af Amer: 37 mL/min — ABNORMAL LOW (ref >60)
Glucose, Bld: 98 mg/dL (ref 65–99)
Potassium: 4.7 mmol/L (ref 3.5–5.1)
Sodium: 141 mmol/L (ref 135–145)

## 2015-12-09 LAB — BLOOD GAS, ARTERIAL
Acid-Base Excess: 3 mmol/L — ABNORMAL HIGH (ref 0.0–2.0)
Acid-Base Excess: 1.3 mmol/L (ref 0.0–2.0)
Bicarbonate: 26.8 mEq/L — ABNORMAL HIGH (ref 20.0–24.0)
Bicarbonate: 25.7 mEq/L — ABNORMAL HIGH (ref 20.0–24.0)
DRAWN BY: 10006
Drawn by: 24487
FIO2: 1
MECHVT: 490 mL
O2 Content: 4 L/min
O2 SAT: 91.9 %
O2 Saturation: 99.2 %
PCO2 ART: 40.2 mmHg (ref 35.0–45.0)
PEEP/CPAP: 5 cmH2O
PH ART: 7.44 (ref 7.350–7.450)
PO2 ART: 64.5 mmHg — AB (ref 80.0–100.0)
PO2 ART: 460 mmHg — AB (ref 80.0–100.0)
Patient temperature: 98.6
Patient temperature: 102.1
RATE: 16 resp/min
TCO2: 28.1 mmol/L (ref 0–100)
TCO2: 27.1 mmol/L (ref 0–100)
pCO2 arterial: 47.7 mmHg — ABNORMAL HIGH (ref 35.0–45.0)
pH, Arterial: 7.362 (ref 7.350–7.450)

## 2015-12-09 LAB — GLUCOSE, CAPILLARY: Glucose-Capillary: 164 mg/dL — ABNORMAL HIGH (ref 65–99)

## 2015-12-09 LAB — TROPONIN I: Troponin I: 0.03 ng/mL (ref <0.031)

## 2015-12-09 LAB — PROTIME-INR
INR: 2.91 — ABNORMAL HIGH (ref 0.00–1.49)
Prothrombin Time: 29.9 seconds — ABNORMAL HIGH (ref 11.6–15.2)

## 2015-12-09 LAB — MAGNESIUM: Magnesium: 1.9 mg/dL (ref 1.7–2.4)

## 2015-12-09 LAB — TRIGLYCERIDES: TRIGLYCERIDES: 69 mg/dL (ref <150)

## 2015-12-09 MED ORDER — IPRATROPIUM BROMIDE 0.02 % IN SOLN
0.5000 mg | Freq: Four times a day (QID) | RESPIRATORY_TRACT | Status: DC
  Administered 2015-12-09 – 2015-12-18 (×36): 0.5 mg via RESPIRATORY_TRACT
  Filled 2015-12-09 (×36): qty 2.5

## 2015-12-09 MED ORDER — FENTANYL CITRATE (PF) 100 MCG/2ML IJ SOLN: 50.0000 ug | INTRAMUSCULAR | Status: DC | PRN

## 2015-12-09 MED ORDER — METHYLPREDNISOLONE SODIUM SUCC 125 MG IJ SOLR
60.0000 mg | Freq: Three times a day (TID) | INTRAMUSCULAR | Status: DC
  Administered 2015-12-09 – 2015-12-10 (×4): 60 mg via INTRAVENOUS
  Filled 2015-12-09 (×4): qty 2

## 2015-12-09 MED ORDER — LEVALBUTEROL HCL 1.25 MG/0.5ML IN NEBU: 1.2500 mg | INHALATION_SOLUTION | Freq: Four times a day (QID) | RESPIRATORY_TRACT | Status: DC

## 2015-12-09 MED ORDER — LORAZEPAM 2 MG/ML IJ SOLN
INTRAMUSCULAR | Status: AC
  Administered 2015-12-09 (×2): 0.25 mg
  Filled 2015-12-09: qty 1

## 2015-12-09 MED ORDER — METOPROLOL TARTRATE 1 MG/ML IV SOLN
5.0000 mg | Freq: Once | INTRAVENOUS | Status: AC
  Administered 2015-12-09: 5 mg via INTRAVENOUS
  Filled 2015-12-09: qty 5

## 2015-12-09 MED ORDER — DEXMEDETOMIDINE HCL IN NACL 400 MCG/100ML IV SOLN
0.4000 ug/kg/h | INTRAVENOUS | Status: DC
  Administered 2015-12-09: 1 ug/kg/h via INTRAVENOUS
  Administered 2015-12-09: 0.6 ug/kg/h via INTRAVENOUS
  Administered 2015-12-09: 0.5 ug/kg/h via INTRAVENOUS
  Administered 2015-12-10 (×2): 0.7 ug/kg/h via INTRAVENOUS
  Administered 2015-12-11: 1 ug/kg/h via INTRAVENOUS
  Administered 2015-12-11: 0.5 ug/kg/h via INTRAVENOUS
  Filled 2015-12-09 (×3): qty 100
  Filled 2015-12-09: qty 50
  Filled 2015-12-09 (×4): qty 100

## 2015-12-09 MED ORDER — MIDAZOLAM HCL 2 MG/2ML IJ SOLN
2.0000 mg | Freq: Once | INTRAMUSCULAR | Status: AC
  Administered 2015-12-09: 2 mg via INTRAVENOUS

## 2015-12-09 MED ORDER — PROPOFOL 10 MG/ML IV BOLUS
INTRAVENOUS | Status: DC | PRN
  Administered 2015-12-09: 60 mg via INTRAVENOUS

## 2015-12-09 MED ORDER — PHENYLEPHRINE HCL 10 MG/ML IJ SOLN
0.0000 ug/min | INTRAMUSCULAR | Status: DC
  Administered 2015-12-09: 250 ug/min via INTRAVENOUS
  Administered 2015-12-11: 5 ug/min via INTRAVENOUS
  Administered 2015-12-12: 100 ug/min via INTRAVENOUS
  Administered 2015-12-13: 20 ug/min via INTRAVENOUS
  Administered 2015-12-13: 15 ug/min via INTRAVENOUS
  Administered 2015-12-14: 30 ug/min via INTRAVENOUS
  Administered 2015-12-16: 7 ug/min via INTRAVENOUS
  Administered 2015-12-17: 45 ug/min via INTRAVENOUS
  Administered 2015-12-17: 40 ug/min via INTRAVENOUS
  Filled 2015-12-09 (×10): qty 4

## 2015-12-09 MED ORDER — FENTANYL CITRATE (PF) 100 MCG/2ML IJ SOLN
50.0000 ug | INTRAMUSCULAR | Status: DC | PRN
  Administered 2015-12-09 (×2): 50 ug via INTRAVENOUS
  Filled 2015-12-09 (×2): qty 2

## 2015-12-09 MED ORDER — ANTISEPTIC ORAL RINSE SOLUTION (CORINZ)
7.0000 mL | OROMUCOSAL | Status: DC
  Administered 2015-12-09 – 2015-12-18 (×89): 7 mL via OROMUCOSAL

## 2015-12-09 MED ORDER — CHLORHEXIDINE GLUCONATE 0.12% ORAL RINSE (MEDLINE KIT)
15.0000 mL | Freq: Two times a day (BID) | OROMUCOSAL | Status: DC
  Administered 2015-12-09 – 2015-12-17 (×18): 15 mL via OROMUCOSAL

## 2015-12-09 MED ORDER — SODIUM CHLORIDE 0.9 % IV BOLUS (SEPSIS)
1000.0000 mL | Freq: Once | INTRAVENOUS | Status: AC
  Administered 2015-12-09: 1000 mL via INTRAVENOUS

## 2015-12-09 MED ORDER — WARFARIN SODIUM 2.5 MG PO TABS
2.5000 mg | ORAL_TABLET | Freq: Once | ORAL | Status: AC
  Administered 2015-12-09: 2.5 mg via ORAL
  Filled 2015-12-09: qty 1

## 2015-12-09 MED ORDER — PROPOFOL 1000 MG/100ML IV EMUL
INTRAVENOUS | Status: AC
  Filled 2015-12-09: qty 100

## 2015-12-09 MED ORDER — LEVALBUTEROL HCL 1.25 MG/0.5ML IN NEBU
1.2500 mg | INHALATION_SOLUTION | Freq: Four times a day (QID) | RESPIRATORY_TRACT | Status: DC
Start: 2015-12-09 — End: 2015-12-18
  Administered 2015-12-09 – 2015-12-18 (×35): 1.25 mg via RESPIRATORY_TRACT
  Filled 2015-12-09 (×41): qty 0.5

## 2015-12-09 MED ORDER — SUCCINYLCHOLINE CHLORIDE 20 MG/ML IJ SOLN
INTRAMUSCULAR | Status: DC | PRN
  Administered 2015-12-09: 80 mg via INTRAVENOUS

## 2015-12-09 MED ORDER — SODIUM CHLORIDE 0.9 % IV SOLN
INTRAVENOUS | Status: DC
  Administered 2015-12-09 (×2): via INTRAVENOUS
  Administered 2015-12-10: 75 mL/h via INTRAVENOUS
  Administered 2015-12-10: 20:00:00 via INTRAVENOUS
  Administered 2015-12-11: 75 mL/h via INTRAVENOUS
  Administered 2015-12-11 – 2015-12-12 (×2): via INTRAVENOUS

## 2015-12-09 MED ORDER — PHENYLEPHRINE HCL 10 MG/ML IJ SOLN
30.0000 ug/min | INTRAVENOUS | Status: DC
  Administered 2015-12-09: 5 ug/min via INTRAVENOUS
  Administered 2015-12-09: 200 ug/min via INTRAVENOUS
  Filled 2015-12-09 (×6): qty 1

## 2015-12-09 MED ORDER — SODIUM CHLORIDE 0.9 % IV SOLN
0.0000 ug/h | INTRAVENOUS | Status: DC
  Administered 2015-12-09: 25 ug/h via INTRAVENOUS
  Administered 2015-12-10: 125 ug/h via INTRAVENOUS
  Administered 2015-12-11 (×2): 150 ug/h via INTRAVENOUS
  Administered 2015-12-12: 50 ug/h via INTRAVENOUS
  Administered 2015-12-13: 150 ug/h via INTRAVENOUS
  Administered 2015-12-13: 100 ug/h via INTRAVENOUS
  Administered 2015-12-14: 150 ug/h via INTRAVENOUS
  Administered 2015-12-14 – 2015-12-15 (×3): 175 ug/h via INTRAVENOUS
  Administered 2015-12-16: 300 ug/h via INTRAVENOUS
  Administered 2015-12-16: 225 ug/h via INTRAVENOUS
  Administered 2015-12-17 (×2): 300 ug/h via INTRAVENOUS
  Administered 2015-12-17 – 2015-12-18 (×2): 400 ug/h via INTRAVENOUS
  Filled 2015-12-09 (×17): qty 50

## 2015-12-09 MED ORDER — NOREPINEPHRINE BITARTRATE 1 MG/ML IV SOLN
2.0000 ug/min | INTRAVENOUS | Status: DC
  Administered 2015-12-09: 2 ug/min via INTRAVENOUS
  Filled 2015-12-09 (×2): qty 4

## 2015-12-09 MED ORDER — PROPOFOL 1000 MG/100ML IV EMUL
0.0000 ug/kg/min | INTRAVENOUS | Status: DC
  Administered 2015-12-09: 20 ug/kg/min via INTRAVENOUS

## 2015-12-09 MED ORDER — PHENYLEPHRINE HCL 10 MG/ML IJ SOLN
0.0000 ug/min | INTRAVENOUS | Status: DC
  Administered 2015-12-09 (×3): 300 ug/min via INTRAVENOUS

## 2015-12-09 MED ORDER — PANTOPRAZOLE SODIUM 40 MG PO PACK
40.0000 mg | PACK | Freq: Every day | ORAL | Status: DC
  Administered 2015-12-10 – 2015-12-17 (×9): 40 mg
  Filled 2015-12-09 (×9): qty 20

## 2015-12-09 MED ORDER — MIDAZOLAM HCL 2 MG/2ML IJ SOLN
INTRAMUSCULAR | Status: AC
  Filled 2015-12-09: qty 2

## 2015-12-09 NOTE — Progress Notes (Addendum)
Pt calling out, extremely dyspneic, Resp 30s, increased work of breathing and accessory muscle use, sats upper 80s-90s on 4L. Pt HR 130-140s afib at rest. Respiratory paged. Breathing treatment administered with no relief. MD paged. Respiratory, MD and rapid response at bedside. Pt placed on bipap. Transferred to 50M, bedside report given. Pt belongings tranferred with pt. Left message for pt daughter.  Sandrea HammondJunris Emberley Kral RN

## 2015-12-09 NOTE — Progress Notes (Signed)
eLink Physician-Brief Progress Note Patient Name: Stephen [name] DOB: 03/08/1941 MRN: [id number]   Date of Service  12/09/2015  HPI/Events of Note  Called by floor coverage, Joneen RoachPaul Hoffman, NP that patient will require intubation. Notified Dr. Marchelle Gearingamaswamy of urgent need for intubation. He is 20 minutes out and requests anesthesia to help intubate the patient.   eICU Interventions  Joneen RoachPaul Hoffman, NP to contact anesthesia.      Intervention Category Major Interventions: Respiratory failure - evaluation and management  Sommer,Steven Eugene 12/09/2015, 4:43 AM

## 2015-12-09 NOTE — Progress Notes (Signed)
ANTICOAGULATION CONSULT NOTE - Follow Up Consult  Pharmacy Consult for warfarin Indication: atrial fibrillation  Allergies  Allergen Reactions  . Penicillins Other (See Comments)    REACTION: feels drunk    Patient Measurements: Height: 5\' 5"  (165.1 cm) Weight: 130 lb 4.8 oz (59.104 kg) (Scale C) IBW/kg (Calculated) : 61.5  Vital Signs: Temp: 102.1 F (38.9 C) (03/24 0600) Temp Source: Axillary (03/24 0600) BP: 87/72 mmHg (03/24 0756) Pulse Rate: 108 (03/24 0756)  Labs:  Recent Labs  12/07/15 0430 12/08/15 0640 12/09/15 0454  HGB 8.2* 8.7* 8.8*  HCT 25.9* 26.6* 28.5*  PLT 236 282 269  LABPROT 26.6* 27.7* 29.9*  INR 2.49* 2.63* 2.91*  CREATININE  --  1.27* 1.74*    Estimated Creatinine Clearance: 30.7 mL/min (by C-G formula based on Cr of 1.74).  Assessment: [age] YOM s/p R femoral nailing for subtrochanteric fracture (OR on 3/18) on warfarin for VTE prophylaxis (on warfarin PTA for AFib- home dose 2.5mg  daily except 5mg  on Mondays and Fridays- last dose PTA 3/17).   S/p 1mg  IV vitamin K on 3/18. INR is trending up steadily but likely related to boosted doses of 5mg . No bleeding noted.  Of note, pt had respiratory distress requiring intubation and transfer to the ICU.   Goal of Therapy:  INR 2-3   Plan:  - Warfarin 2.5mg  PO x 1 tonight - Daily INR  Lysle Pearlachel Katheleen Stella, PharmD, BCPS Pager # [phone] 12/09/2015 8:23 AM

## 2015-12-09 NOTE — Procedures (Signed)
Central Venous Catheter Insertion Procedure Note Drue NovelDon S Webb [id number] [birth year]  Procedure: Insertion of Central Venous Catheter Indications: Assessment of intravascular volume, Drug and/or fluid administration and Frequent blood sampling  Procedure Details Consent: Risks of procedure as well as the alternatives and risks of each were explained to the (patient/caregiver).  Consent for procedure obtained. Time Out: Verified patient identification, verified procedure, site/side was marked, verified correct patient position, special equipment/implants available, medications/allergies/relevent history reviewed, required imaging and test results available.  Performed  Maximum sterile technique was used including antiseptics, cap, gloves, gown, hand hygiene, mask and sheet. Skin prep: Chlorhexidine; local anesthetic administered A antimicrobial bonded/coated triple lumen catheter was placed in the left internal jugular vein to 17 cm using the Seldinger technique.  Evaluation Blood flow good Complications: No apparent complications Patient did tolerate procedure well. Chest X-ray ordered to verify placement.  CXR: pending.    Procedure performed under direct supervision of Dr. Vassie LollAlva and with ultrasound guidance for real time vessel cannulation.      Canary BrimBrandi Erique Kaser, NP-C [facility] Pgr: [phone] or if no answer [phone] 12/09/2015, 8:54 AM

## 2015-12-09 NOTE — Significant Event (Addendum)
Rapid Response Event Note Called to room per Respiratory Therapist for Patient in severe respiratory failure. RT suggesting Bipap and ABG in process. Advised RT to have RN page [facility] to bedside STAT while RRT en route and place Bipap for WOB.   Overview: Time Called: 0413 Arrival Time: 0420 (RRT unable to come to bedside immediately, advised to call NP to bedside STAT) Event Type: Respiratory  Initial Focused Assessment/Interventions: Pt found restless in bed pulling at BIPAP and IVs.RR in 40-50's using accessory muscles and gasping for air. Pt grey in skin tone, hot to touch. ABG resulted and read back to K. Schorr NP at bedside. Ativan 0.5mg  total dose IVP given. PCCM consulted per NP and Joneen RoachPaul Hoffman NP to bedside at 0430. Pt transferred STAT at 0440 to 3 M room 8. Bedside report Provided per 3 Airline pilotast RN. Anethsia paged and Pt intubate per CRNA. Care assumed by ICU team.      Event Summary: Name of Physician Notified: Merdis DelayK. Schorr NP [facility] at [phone]  Name of Consulting Physician Notified: PCCM P. Hoffman NP at [phone]  Outcome: Transferred (Comment)     White, James IvanoffBrooke Leigh Miguel Christiana

## 2015-12-09 NOTE — Progress Notes (Signed)
Spoke with pt grandson. Notified of pt transfer.  Sandrea HammondJunris Gaylen Pereira RN

## 2015-12-09 NOTE — Progress Notes (Signed)
OT Cancellation Note  Patient Details Name: Stephen [name] MRN: [id number] DOB: [birth year]   Cancelled Treatment:    Reason Eval/Treat Not Completed: Medical issues which prohibited therapy - events noted.  Will check back first of next week and will see if medically appropriate.    Angelene GiovanniConarpe, Taji Sather M  Coyt Govoni Eufaulaonarpe, OTR/L [phone] 12/09/2015, 9:54 AM

## 2015-12-09 NOTE — Progress Notes (Signed)
Initial Nutrition Assessment  DOCUMENTATION CODES:   Non-severe (moderate) malnutrition in context of chronic illness  INTERVENTION:   - If unable to extubate, recommend initiating enteral nutrition with Vital AF 1.2 at 25 ml/hr to increase by 10 ml every 4 hours to a goal rate of 55 ml/hr.  Tube feeding regimen will provide a total of 1584 kcals, 99 grams of protein, and 1071 ml of water.   NUTRITION DIAGNOSIS:   Malnutrition related to chronic illness as evidenced by mild depletion of muscle mass, mild depletion of body fat.  GOAL:   Patient will meet greater than or equal to 90% of their needs  MONITOR:   Vent status, Labs, Weight trends, Skin, I & O's  REASON FOR ASSESSMENT:   Ventilator    ASSESSMENT:   75 yo male with hx of Afib, recurrent falls, advanced COPD, hypothyroidism, BPH, HTN who was brought due to fall. He said he tripped while he was trying to move from his porch. He fell on his right hip and developed a bruise/pain at the site with no head trauma. He denied any dizziness, lightheadedness, chest pain, N/V/D/C/Abd pain. He has chronic dyspnea due to COPD with exertion.     3/18: right hip fracture repair 3/24: developed respiratory distress, transferred to ICU and intubated  Spoke to pt's daughter who reports that the patient has been eating well PTA and for his past 5 days in the hospital.  Per chart review, patient was consuming 50-100% of his meals. Family denies any recent weight changes.   Patient currently intubated on ventilator support.   MV: 8.3 ml/min Temp (24hrs), Avg:99.4 F (37.4 C), Min:97.5 F (36.4 C), Max:102.1 F (38.9 C) OG tube, tip in stomach  Nutrition Focused Physical Exam was completed.  Findings include mild fat depletion, mild muscle depletion, and moderate edema in the patients right leg.  Medications and labs reviewed.   Diet Order:  Diet NPO time specified  Skin:  Wound (see comment) (Closed R hip incision)  Last BM:   3/22  Height:   Ht Readings from Last 1 Encounters:  12/05/15 5\' 5"  (1.651 m)    Weight:   Wt Readings from Last 1 Encounters:  12/08/15 130 lb 4.8 oz (59.104 kg)    Ideal Body Weight:  61.8 kg  BMI:  Body mass index is 21.68 kg/(m^2).  Estimated Nutritional Needs:   Kcal:  1590  Protein:  >/= 93 grams  Fluid:  >/= 1.5 L  EDUCATION NEEDS:   No education needs identified at this time  Doroteo Glassmanoleman Rosanna Bickle, Dietetic Intern Pager: [phone]

## 2015-12-09 NOTE — Care Management Note (Signed)
Case Management Note  Patient Details  Name: Stephen [name] MRN: [id number] Date of Birth: 03/06/1941  Subjective/Objective:   Pt admitted to 37M with VDRF on 12/09/15.   Pt admitted to hospital with hip fracture and SNF originally  planned for dc.                   Action/Plan: CSW following for possible SNF at dc, pending medical stability.  Will follow progress.    Expected Discharge Date:                  Expected Discharge Plan:  Skilled Nursing Facility In-House Referral:     Discharge planning Services  CM Consult  Post Acute Care Choice:  Durable Medical Equipment, Resumption of Svcs/PTA Provider Choice offered to:  Patient  DME Arranged:  Oxygen DME Agency:  [facility]  HH Arranged:    HH Agency:     Status of Service:  In process, will continue to follow  Medicare Important Message Given:  Yes Date Medicare IM Given:    Medicare IM give by:    Date Additional Medicare IM Given:    Additional Medicare Important Message give by:     If discussed at Long Length of Stay Meetings, dates discussed:    Additional Comments:  Quintella BatonJulie W. Jamariya Davidoff, RN, BSN  Trauma/Neuro ICU Case Manager [phone]

## 2015-12-09 NOTE — Progress Notes (Signed)
Shift event note:  Notified by RN regarding pt in acute respiratory distress and will likely need BiPAP. To bedside immediately where pt found w/ RR in 40-50's w/ "guppy type" respirations. BBS w/ minimal air movement w/o wheezing. Pt ashen and warm to touch. Very agitated. Initially would not allow RT to apply BiPAP. Ativan 0.25 mg given IV w/ little relief. Ativan 0.25 mg repeated IV and pt allowed BiPAP to be placed.  Stat ABG reveals normal pH, normal pC02 and p02 of 64.5. Minimal relief of dypsnea (RR remains in the 40-50's) after 15 minutes on BiPAP. Remains in A-Fib w/ RVR w/ HR in the 120's. Last temp was 98.7. Pt prepared for transfer to ICU.  Assessment/Plan: 1. Acute on chronic respiratory failure in setting of severe COPD: Spoke w/ Dr Arsenio LoaderSommer w/ [facility] regarding pt's need for stat intubation. P. Mikey BussingHoffman, NP w/ PCCM responded to bedside and assisted w/ urgent transfer to ICU. On arrival to 76M-08 pt was immediately prepared for intubation. PCCM to manage while on vent. Appreciate PCCM input and management. Will follow.   Leanne ChangKatherine P. Mikhai Bienvenue, NP-C [facility] Pager [phone]

## 2015-12-09 NOTE — Progress Notes (Signed)
[facility] Pt with increased work of breathing with accessory muscle use. O2 Sats 96% on 3L. Lungs sounds dimished. Respiratory administered PRN treatment with relief.  Pt HR sustaining within 120-130s at rest BP 131/75.  MD notified. New orders given. Will continue to monitor closely.  Sandrea HammondJunris Nida Manfredi RN

## 2015-12-09 NOTE — Progress Notes (Signed)
Bladder scanned the patient and revealed > 450ml.  After removing the condom catheter which was correctly placed over the foreskin of the uncircumcised patient, an attempt was made to I&O catheterize the patient.  It was very difficult to retract the foreskin to reveal the head of the penis and it was found to be enlarged, blue in color and constricted at the base of the head of the penis.   Dr. Vassie LollAlva was paged and notified of possible phimosis.  He ordered a foley to be placed and continue to monitor. Stephen [name]

## 2015-12-09 NOTE — Progress Notes (Signed)
Events noted Mobilize when ready medically

## 2015-12-09 NOTE — Progress Notes (Signed)
eLink Physician-Brief Progress Note Patient Name: Drue NovelDon S Pallas DOB: 12/21/1940 MRN: [id number]   Date of Service  12/09/2015  HPI/Events of Note  Hypotension - BP = 66/33. Patient currently on Phenylephrine IV infusion at 200 mcg/min.  eICU Interventions  Will order: 1. Bolus with 0.9 NaCl 1 liter IV over 1 hour now. 2. Increase Phenylephrine IV infusion ceiling to 400 mcg/min.     Intervention Category Major Interventions: Hypotension - evaluation and management  Mandeep Ferch Eugene 12/09/2015, 6:42 AM

## 2015-12-09 NOTE — Progress Notes (Signed)
SUBJECTIVE:  Became progressively dyspneic and was intubated and transferred to the ICU.  Current sedated and vented.  OBJECTIVE:   Vitals:   Filed Vitals:   12/09/15 1030 12/09/15 1045 12/09/15 1100 12/09/15 1115  BP: 106/81 127/90 117/92 113/94  Pulse: 107 93 75 100  Temp:      TempSrc:      Resp: 19 20 18 20   Height:      Weight:      SpO2: 100% 100% 100% 100%   I&O's:   Intake/Output Summary (Last 24 hours) at 12/09/15 1157 Last data filed at 12/09/15 1100  Gross per 24 hour  Intake 4725.38 ml  Output    650 ml  Net 4075.38 ml   TELEMETRY: Reviewed telemetry pt in Afib with controlled ventricular rate:     PHYSICAL EXAM General: sedated, ill appearing Head:   ETT in place  Lungs:  Mechanical breath sounds, no wheezing Heart:  irr irr S1 S2   Abdomen: abdomen soft and non-tender Msk:  Back normal,  Normal strength and tone for age. Extremities:  Trace edema.   Neuro: Alert and oriented. Psych:  Normal affect, responds appropriately Skin: No rash   LABS: Basic Metabolic Panel:  Recent Labs  16/10/96 0640 12/09/15 0454  NA 140 141  K 4.9 4.7  CL 106 105  CO2 24 28  GLUCOSE 116* 98  BUN 27* 38*  CREATININE 1.27* 1.74*  CALCIUM 8.4* 8.4*  MG  --  1.9   Liver Function Tests: No results for input(s): AST, ALT, ALKPHOS, BILITOT, PROT, ALBUMIN in the last 72 hours. No results for input(s): LIPASE, AMYLASE in the last 72 hours. CBC:  Recent Labs  12/08/15 0640 12/09/15 0454  WBC 16.0* 15.3*  HGB 8.7* 8.8*  HCT 26.6* 28.5*  MCV 93.7 94.4  PLT 282 269   Cardiac Enzymes:  Recent Labs  12/09/15 1030  TROPONINI 0.03   BNP: Invalid input(s): POCBNP D-Dimer: No results for input(s): DDIMER in the last 72 hours. Hemoglobin A1C: No results for input(s): HGBA1C in the last 72 hours. Fasting Lipid Panel:  Recent Labs  12/09/15 0633  TRIG 69   Thyroid Function Tests:  Recent Labs  12/08/15 0940  TSH 0.154*   Anemia Panel: No  results for input(s): VITAMINB12, FOLATE, FERRITIN, TIBC, IRON, RETICCTPCT in the last 72 hours. Coag Panel:   Lab Results  Component Value Date   INR 2.91* 12/09/2015   INR 2.63* 12/08/2015   INR 2.49* 12/07/2015    RADIOLOGY: Dg Chest 1 View  12/05/2015  CLINICAL DATA:  Pt reports he tripped and fell in his living room today as he was walking into the house and landed on his right side; obvious deformity to right leg; pt denies left hip pain; best obtainable images due to pt's condition. EXAM: CHEST 1 VIEW COMPARISON:  08/14/2011 FINDINGS: Lungs are hyperinflated. Heart is mildly enlarged. Aorta is tortuous and densely calcified. Moderate emphysematous changes are present. There is perihilar bronchitic change. There are no focal consolidations or pleural effusions. Bilateral mid lung zone atelectasis or scarring is noted. No pulmonary edema. No evidence for pneumothorax or acute fracture. IMPRESSION: 1. Emphysematous changes and bronchitic changes. 2. Atelectasis or scarring in the mid lung zones bilaterally. 3. No evidence for pneumothorax or acute fracture. Electronically Signed   By: Norva Pavlov M.D.   On: 11/22/2015 17:27   Dg Chest 2 View  12/06/2015  CLINICAL DATA:  Shortness of breath and leukocytosis for 1  day. EXAM: CHEST  2 VIEW COMPARISON:  Chest x-rays dated 12/07/2015 08/14/2011. FINDINGS: Given the oblique patient positioning, cardiomediastinal silhouette is felt to be stable in size and configuration. Prominence of the right upper mediastinum likely accentuated by the patient positioning but could represent inferior extension of a thyroid mass or goiter. Lungs are at least mildly hyperexpanded suggesting COPD. Areas of chronic scarring/atelectasis/fibrosis noted within each lung without significant interval change. No new lung findings. No evidence of pneumonia. No pleural effusion. Osseous structures are diffusely osteopenic. Multiple chronic -appearing compression fracture  deformities are seen within the slightly kyphotic thoracic spine. No acute- appearing osseous abnormality identified. IMPRESSION: 1. Hyperexpanded lungs suggesting COPD. 2. No evidence of acute cardiopulmonary abnormality. No evidence of pneumonia. Areas of chronic scarring/fibrosis/atelectasis within each lung. 3. Stable cardiomediastinal silhouette. 4. Prominence of the right upper mediastinum likely accentuated by the oblique patient positioning but may represent inferior extension of a right thyroid lobe mass/goiter. Would consider confirmation with chest CT. Electronically Signed   By: Bary Richard M.D.   On: 12/06/2015 15:58   Ct Chest Wo Contrast  12/08/2015  CLINICAL DATA:  Follow-up of thyroid mass. EXAM: CT CHEST WITHOUT CONTRAST TECHNIQUE: Multidetector CT imaging of the chest was performed following the standard protocol without IV contrast. COMPARISON:  Chest 12/06/2015.  CT chest 07/25/2011. FINDINGS: Thyroid gland is normal and, if anything is small. No evidence of thyroid goiter or thyroid mass. No mass or lymphadenopathy demonstrated in the right upper chest to account for the chest x-ray finding. This finding was likely due to patient rotation and vascular structures. Normal heart size. Calcification in the coronary arteries and aortic valve. Normal caliber thoracic aorta with calcification. Calcification in the great vessel origins. Scattered lymph nodes in the mediastinum are not pathologically enlarged. Esophagus is decompressed. Prominent emphysematous changes in the lungs with scattered fibrosis. 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 Atelectasis in the lung bases. No pneumothorax. No pleural effusions. Included portions of the upper abdominal organs demonstrate prominent increased density of the liver likely represents amiodarone deposition in a patient on this medication. Old bilateral rib fractures. Degenerative changes in the spine.  IMPRESSION: Normal thyroid gland. No mass to account for changes on chest radiograph. Changes likely represented patient rotation and vascular structures. Prominent emphysematous changes in the lungs with scattered fibrosis. Focal ground-glass infiltration in the right middle lung. 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 Electronically Signed   By: Burman Nieves M.D.   On: 12/08/2015 21:53   Pelvis Portable  11/24/2015  CLINICAL DATA:  Operative fixation of a hip fracture. EXAM: PORTABLE PELVIS 1-2 VIEWS COMPARISON:  Previous examinations yesterday and earlier today. FINDINGS: The screw, rod and cerclage wire fixation of the previously demonstrated right femoral subtrochanteric fracture. Anatomic position and alignment of the fragments. There is a mildly comminuted fracture through the medial cortex of the distal femur at the location of the distal fixation screw. Postoperative air is noted as well as atheromatous arterial calcifications. IMPRESSION: 1. Mildly comminuted fracture through the medial cortex of the distal femur at the location of the distal fixation screw. 2. Hardware fixation of the previously demonstrated subtrochanteric fracture with anatomic position and alignment. These results will be called to the ordering clinician or representative by the Radiologist Assistant, and communication documented in the PACS or zVision Dashboard. Electronically Signed   By: Beckie Salts M.D.   On: 11/21/2015 14:09   Dg Chest [facility]  12/09/2015  CLINICAL DATA:  Hypoxia.  Central catheter placement EXAM: PORTABLE CHEST 1 VIEW COMPARISON:  Study obtained earlier in the day FINDINGS: Endotracheal tube tip is 3.1 cm above the carina. Left jugular catheter tip is in the superior vena cava. Nasogastric tube tip and side port are in the stomach. No pneumothorax. There is no demonstrable edema or consolidation. Heart is upper normal in size with pulmonary vascularity within normal  limits. There is atherosclerotic calcification in the aortic arch region. No adenopathy evident. IMPRESSION: Tube and catheter positions as described without pneumothorax. No apparent edema or consolidation. Electronically Signed   By: Bretta BangWilliam  Woodruff III M.D.   On: 12/09/2015 09:30   Dg Chest Port 1 View  12/09/2015  CLINICAL DATA:  75 year old male with respiratory distress. Status post intubation. EXAM: PORTABLE CHEST 1 VIEW COMPARISON:  Chest CT dated 12/08/2015 FINDINGS: An endotracheal tube is noted with tip approximately 3 cm above the carina. There is emphysematous changes of the lungs. Bibasilar atelectatic changes noted. An area of increased interstitial prominence at the right lung base may be chronic and related to underlying pulmonary fibrosis. Developing pneumonia is not excluded. There is no focal consolidation, pleural effusion, or pneumothorax. The cardiac silhouette is within normal limits. There is degenerative changes of the spine. No acute fracture. IMPRESSION: Endotracheal tube above the carina. Electronically Signed   By: Elgie CollardArash  Radparvar M.D.   On: 12/09/2015 06:25   Dg Abd Portable 1v  12/02/2015  CLINICAL DATA:  Assess for small bowel obstruction. Initial encounter. EXAM: PORTABLE ABDOMEN - 1 VIEW COMPARISON:  MRI of the lumbar spine performed 05/23/2011 FINDINGS: The stomach is partially filled with air. The colon is partially filled with stool. No free intra-abdominal air is seen, though evaluation for free air is limited on a single supine view. No free intra-abdominal air is seen, though evaluation for free air is limited on a single supine view. No acute osseous abnormalities are seen. The visualized lung bases are grossly clear. IMPRESSION: Unremarkable bowel gas pattern; no free intra-abdominal air seen. Moderate amount of stool noted in the colon. Stomach partially filled with air. Electronically Signed   By: Roanna RaiderJeffery  Chang M.D.   On: 11/16/2015 23:03   Dg C-arm 61-120  Min  12/14/2015  CLINICAL DATA:  Operative fixation of a subtrochanteric proximal right femur fracture. EXAM: RIGHT FEMUR 2 VIEWS; DG C-ARM 61-120 MIN COMPARISON:  Right hip radiographs obtained yesterday. FINDINGS: Seven C-arm views of the right hip and femur demonstrate compression screw and rod fixation of the previously demonstrated subtrochanteric right femur fracture. Cerclage wires are also in place. Anatomic position and alignment. IMPRESSION: Anatomic position and alignment of the proximal right femur fracture following hardware fixation. Electronically Signed   By: Beckie SaltsSteven  Reid M.D.   On: 11/24/2015 11:19   Dg Femur, Min 2 Views Right  11/27/2015  CLINICAL DATA:  Operative fixation of a subtrochanteric proximal right femur fracture. EXAM: RIGHT FEMUR 2 VIEWS; DG C-ARM 61-120 MIN COMPARISON:  Right hip radiographs obtained yesterday. FINDINGS: Seven C-arm views of the right hip and femur demonstrate compression screw and rod fixation of the previously demonstrated subtrochanteric right femur fracture. Cerclage wires are also in place. Anatomic position and alignment. IMPRESSION: Anatomic position and alignment of the proximal right femur fracture following hardware fixation. Electronically Signed   By: Beckie SaltsSteven  Reid M.D.   On: 12/11/2015 11:19   Dg Hips Bilat With Pelvis 3-4 Views  12/15/2015  CLINICAL DATA:  Fall with right hip pain  EXAM: DG HIP (WITH OR WITHOUT PELVIS) 3-4V BILAT COMPARISON:  None. FINDINGS: There is an oblique subtrochanteric fracture in the proximal right femur, with prominent apex anterior angulation, 2.5 cm medial displacement of the distal fracture fragment and 6 cm over riding of the fracture fragments. No additional fracture is seen in the pelvis or hips. No dislocation at the hip joints. No appreciable hip arthropathy. No pelvic diastasis. Diffuse osteopenia. No suspicious focal osseous lesions. IMPRESSION: Oblique, displaced, overriding and angulated subtrochanteric right  proximal femur fracture. Diffuse osteopenia. Electronically Signed   By: Delbert Phenix M.D.   On: 11/25/2015 17:28      ASSESSMENT: 75 year old male with history of PAF, severe COPD, hypothyroidism and HTN in respiratory failure post extubation after ORIF of right hip fracture.  PLAN:   A fib: currently rate controlled with diltiazem and amiodarone. -Coumadin is managed per pharmacy and currently theraputic - Synthroid is held due to low TSH.  Gust Rung, DO  12/09/2015  11:57 AM   I have examined the patient and reviewed assessment and plan and discussed with patient.  Agree with above as stated.  Continue rate control.  Would restart low dose Synthroid when he can take PO.  [id number] -50 mcg.  Would defer to Internal medicine. Spoke to daughter and grandson.  Steroids for COPD.  Erskine Steinfeldt S.

## 2015-12-09 NOTE — Anesthesia Procedure Notes (Signed)
Procedure Name: Intubation Date/Time: 12/09/2015 5:03 AM Performed by: Arlice ColtMANESS, Greenly Rarick B Pre-anesthesia Checklist: Patient identified, Emergency Drugs available, Suction available, Patient being monitored and Timeout performed Patient Re-evaluated:Patient Re-evaluated prior to inductionOxygen Delivery Method: Circle system utilized Preoxygenation: Pre-oxygenation with 100% oxygen Intubation Type: IV induction Laryngoscope Size: Glidescope Grade View: Grade I Tube type: Subglottic suction tube Tube size: 7.5 mm Number of attempts: 1 Airway Equipment and Method: Stylet Placement Confirmation: ETT inserted through vocal cords under direct vision,  positive ETCO2 and breath sounds checked- equal and bilateral Secured at: 22 cm Tube secured with: Tape Dental Injury: Teeth and Oropharynx as per pre-operative assessment

## 2015-12-09 NOTE — Progress Notes (Signed)
PULMONARY / CRITICAL CARE MEDICINE   Name: Stephen [name] MRN: [id number] DOB: 28-Feb-1941    ADMISSION DATE:  12/05/2015 CONSULTATION DATE:  11/24/2015  REFERRING MD:  Dr August Saucerean, Ortho  CHIEF COMPLAINT:  Respiratory Distress  HISTORY OF PRESENT ILLNESS:   75 year old man with severe COPD, prednisone and oxygen dependent, atrial fibrillation on Coumadin, hypertension. He was admitted on 11/27/2015 with a right hip fracture after mechanical fall.  He went to the operating room on 3/19 for hip repair. In the PACU he was extubated with some difficulty but then continued to have significant increased work of breathing. PCCM asked to evaluate. Was on BiPAP temporarily and responded well. 3/24 early AM while on floor, developed resp distress again and would not tolerate BiPAP. Moved to ICU for intubation.   SUBJECTIVE:   VITAL SIGNS: BP 131/75 mmHg  Pulse 133  Temp(Src) 98.7 F (37.1 C) (Oral)  Resp 18  Ht 5\' 5"  (1.651 m)  Wt 59.104 kg (130 lb 4.8 oz)  BMI 21.68 kg/m2  SpO2 96%  HEMODYNAMICS:    VENTILATOR SETTINGS:    INTAKE / OUTPUT: I/O last 3 completed shifts: In: 7562.5 [P.O.:1200; I.V.:6362.5] Out: 2476 [Urine:2475; Stool:1]  PHYSICAL EXAMINATION: General:  Cachectic man in moderate respiratory distress Neuro:  Awake alert and interactive, oriented, nonfocal exam HEENT:  OP moist, nasal congestion and some difficulty moving air through his nose Cardiovascular:  Regular, no murmur Lungs:  Significant increase in work of breathing with accessory muscle use, no wheezing Abdomen:  Protuberant soft  Musculoskeletal:  Right lower extremity painful to touch, no significant edema Skin:  No rash  LABS:  BMET  Recent Labs Lab 12/05/15 0430 12/06/15 0329 12/08/15 0640  NA 142 141 140  K 4.6 4.6 4.9  CL 111 109 106  CO2 25 25 24   BUN 25* 29* 27*  CREATININE 1.79* 1.87* 1.27*  GLUCOSE 118* 125* 116*    Electrolytes  Recent Labs Lab 12/05/15 0430 12/06/15 0329  12/08/15 0640  CALCIUM 7.5* 8.0* 8.4*  MG  --  2.2  --   PHOS  --  3.1  --     CBC  Recent Labs Lab 12/06/15 0329 12/07/15 0430 12/08/15 0640  WBC 16.3* 12.5* 16.0*  HGB 8.8* 8.2* 8.7*  HCT 28.3* 25.9* 26.6*  PLT 216 236 282    Coag's  Recent Labs Lab 12/06/15 0329 12/07/15 0430 12/08/15 0640  INR 1.71* 2.49* 2.63*    Sepsis Markers No results for input(s): LATICACIDVEN, PROCALCITON, O2SATVEN in the last 168 hours.  ABG  Recent Labs Lab 12/09/15 0415  PHART 7.440  PCO2ART 40.2  PO2ART 64.5*    Liver Enzymes  Recent Labs Lab 11/29/2015 1622  AST 22  ALT 20  ALKPHOS 84  BILITOT 0.6  ALBUMIN 3.3*    Cardiac Enzymes No results for input(s): TROPONINI, PROBNP in the last 168 hours.  Glucose  Recent Labs Lab 12/06/15 1213  GLUCAP 149*    Imaging Ct Chest Wo Contrast  12/08/2015  CLINICAL DATA:  Follow-up of thyroid mass. EXAM: CT CHEST WITHOUT CONTRAST TECHNIQUE: Multidetector CT imaging of the chest was performed following the standard protocol without IV contrast. COMPARISON:  Chest 12/06/2015.  CT chest 07/25/2011. FINDINGS: Thyroid gland is normal and, if anything is small. No evidence of thyroid goiter or thyroid mass. No mass or lymphadenopathy demonstrated in the right upper chest to account for the chest x-ray finding. This finding was likely due to patient rotation and vascular structures. Normal heart  size. Calcification in the coronary arteries and aortic valve. Normal caliber thoracic aorta with calcification. Calcification in the great vessel origins. Scattered lymph nodes in the mediastinum are not pathologically enlarged. Esophagus is decompressed. Prominent emphysematous changes in the lungs with scattered fibrosis. 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 Atelectasis in the lung bases. No pneumothorax. No pleural effusions. Included portions of the upper abdominal organs demonstrate prominent  increased density of the liver likely represents amiodarone deposition in a patient on this medication. Old bilateral rib fractures. Degenerative changes in the spine. IMPRESSION: Normal thyroid gland. No mass to account for changes on chest radiograph. Changes likely represented patient rotation and vascular structures. Prominent emphysematous changes in the lungs with scattered fibrosis. Focal ground-glass infiltration in the right middle lung. 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 Electronically Signed   By: Burman Nieves M.D.   On: 12/08/2015 21:53     STUDIES:   CULTURES:  ANTIBIOTICS: Clindamycin post procedure  SIGNIFICANT EVENTS: Right hip repair 3/19  LINES/TUBES: ETT 3/24 >  DISCUSSION: 75 year old man with severe COPD with acute on chronic respiratory failure following right hip surgery. Initially ok on BiPAP and transferred out of ICU, then 3/24 developed resp distress again requiring intubation and ICU transfer.  ASSESSMENT / PLAN:  PULMONARY A: Acute on chronic respiratory failure without any current wheezing Severe COPD  P:   STAT intubation Full vent support Scheduled, PRN nebs Prednisone > solumedrol O2 sat goal 88-92% Vent bundle  CARDIOVASCULAR A:  Hypotension, RSI induced HTN Atrial fibrillation now with RVR >improved after intubation  P:  Telemetry monitoring Phenylephrine for MAP goal > 1L bolus Cardiology following Continue coumadin per pharmacy for a-fib. Continue amiodarone Continue diltiazem, metoprolol as BP allows  RENAL A:   Acute on chronic renal insufficiency P:   Follow BMP Follow UOP Replace electrolytes as indicated KVO IVF.  GASTROINTESTINAL A:   Umbilical hernia P:   Planned follow-up with [facility] as an outpatient  HEMATOLOGIC A:   ABLA s/p surgery Chronic anticoagulation P:  Coumadin per pharmacy  INFECTIOUS A:   No evidence of active infection P:   Prophylactic  clindamycin  ENDOCRINE A:   Hypothyroidism  P:   Holding synthroid as TSH low  NEUROLOGIC A:   Pain control post hip surgery P:   RASS goal: -1 Precedex for sedation (BP could not tolerate propofol) PRN fentanyl for analgesia  Family Grandson-in-law updated via phone  Needs GOC discussion  Day 7 = 3/30  APP critical care time 45 mins  Joneen Roach, AGACNP-BC [facility] Pulmonology/Critical Care Pager [phone] or [phone]  12/09/2015 4:59 AM

## 2015-12-10 ENCOUNTER — Inpatient Hospital Stay (HOSPITAL_COMMUNITY): Payer: PPO

## 2015-12-10 DIAGNOSIS — I480 Paroxysmal atrial fibrillation: Secondary | ICD-10-CM

## 2015-12-10 LAB — BASIC METABOLIC PANEL
Anion gap: 7 (ref 5–15)
BUN: 37 mg/dL — AB (ref 6–20)
CALCIUM: 7.5 mg/dL — AB (ref 8.9–10.3)
CO2: 24 mmol/L (ref 22–32)
CREATININE: 1.58 mg/dL — AB (ref 0.61–1.24)
Chloride: 110 mmol/L (ref 101–111)
GFR calc Af Amer: 48 mL/min — ABNORMAL LOW (ref >60)
GFR calc non Af Amer: 41 mL/min — ABNORMAL LOW (ref >60)
GLUCOSE: 139 mg/dL — AB (ref 65–99)
Potassium: 5 mmol/L (ref 3.5–5.1)
Sodium: 141 mmol/L (ref 135–145)

## 2015-12-10 LAB — PROCALCITONIN: Procalcitonin: 7.82 ng/mL

## 2015-12-10 LAB — GLUCOSE, CAPILLARY
GLUCOSE-CAPILLARY: 125 mg/dL — AB (ref 65–99)
GLUCOSE-CAPILLARY: 117 mg/dL — AB (ref 65–99)
Glucose-Capillary: 114 mg/dL — ABNORMAL HIGH (ref 65–99)
Glucose-Capillary: 125 mg/dL — ABNORMAL HIGH (ref 65–99)

## 2015-12-10 LAB — PROTIME-INR
INR: 3.82 — ABNORMAL HIGH (ref 0.00–1.49)
Prothrombin Time: 36.7 seconds — ABNORMAL HIGH (ref 11.6–15.2)

## 2015-12-10 LAB — CBC
HCT: 25.1 % — ABNORMAL LOW (ref 39.0–52.0)
Hemoglobin: 8.3 g/dL — ABNORMAL LOW (ref 13.0–17.0)
MCH: 30.9 pg (ref 26.0–34.0)
MCHC: 33.1 g/dL (ref 30.0–36.0)
MCV: 93.3 fL (ref 78.0–100.0)
Platelets: 230 10*3/uL (ref 150–400)
RBC: 2.69 MIL/uL — ABNORMAL LOW (ref 4.22–5.81)
RDW: 16.1 % — ABNORMAL HIGH (ref 11.5–15.5)
WBC: 23.7 10*3/uL — ABNORMAL HIGH (ref 4.0–10.5)

## 2015-12-10 MED ORDER — METHYLPREDNISOLONE SODIUM SUCC 125 MG IJ SOLR
60.0000 mg | Freq: Two times a day (BID) | INTRAMUSCULAR | Status: DC
  Administered 2015-12-10 – 2015-12-11 (×3): 60 mg via INTRAVENOUS
  Filled 2015-12-10 (×3): qty 2

## 2015-12-10 MED ORDER — FENTANYL BOLUS VIA INFUSION
25.0000 ug | INTRAVENOUS | Status: DC | PRN
  Administered 2015-12-12 – 2015-12-15 (×6): 50 ug via INTRAVENOUS
  Administered 2015-12-15: 25 ug via INTRAVENOUS
  Filled 2015-12-10 (×2): qty 50

## 2015-12-10 MED ORDER — INSULIN ASPART 100 UNIT/ML ~~LOC~~ SOLN
0.0000 [IU] | SUBCUTANEOUS | Status: DC
  Administered 2015-12-10 – 2015-12-13 (×7): 1 [IU] via SUBCUTANEOUS
  Administered 2015-12-14: 2 [IU] via SUBCUTANEOUS
  Administered 2015-12-14 – 2015-12-16 (×10): 1 [IU] via SUBCUTANEOUS
  Administered 2015-12-16: 2 [IU] via SUBCUTANEOUS
  Administered 2015-12-16 (×3): 1 [IU] via SUBCUTANEOUS
  Administered 2015-12-17: 2 [IU] via SUBCUTANEOUS
  Administered 2015-12-17 (×2): 1 [IU] via SUBCUTANEOUS
  Administered 2015-12-17: 2 [IU] via SUBCUTANEOUS
  Administered 2015-12-17 (×2): 1 [IU] via SUBCUTANEOUS
  Administered 2015-12-18: 2 [IU] via SUBCUTANEOUS

## 2015-12-10 NOTE — Progress Notes (Signed)
Subjective:  Remains intubated but awake and able to follow commands.  No c/o chest pain  Objective:  Vital Signs in the last 24 hours: BP 116/64 mmHg  Pulse 78  Temp(Src) 98.1 F (36.7 C) (Axillary)  Resp 16  Ht 5\' 5"  (1.651 m)  Wt 59.104 kg (130 lb 4.8 oz)  BMI 21.68 kg/m2  SpO2 98%  Physical Exam: Intubated WM in NAD Lungs:  Reduced BS Cardiac:  Regular rhythm, normal S1 and S2, no S3 Abdomen:  Soft, nontender, no masses Extremities:  No edema present  Intake/Output from previous day: 03/24 0701 - 03/25 0700 In: 3799.7 [I.V.:3799.7] Out: 1750 [Urine:1750] Weight Filed Weights   12/06/15 0441 12/07/15 0243 12/08/15 0419  Weight: 61.598 kg (135 lb 12.8 oz) 58.423 kg (128 lb 12.8 oz) 59.104 kg (130 lb 4.8 oz)    Lab Results: Basic Metabolic Panel:  Recent Labs  16/06/9602/24/17 0454 12/10/15 0505  NA 141 141  K 4.7 5.0  CL 105 110  CO2 28 24  GLUCOSE 98 139*  BUN 38* 37*  CREATININE 1.74* 1.58*    CBC:  Recent Labs  12/09/15 0454 12/10/15 0505  WBC 15.3* 23.7*  HGB 8.8* 8.3*  HCT 28.5* 25.1*  MCV 94.4 93.3  PLT 269 230    BNP No results found for: BNP  PROTIME: Lab Results  Component Value Date   INR 3.82* 12/10/2015   INR 2.91* 12/09/2015   INR 2.63* 12/08/2015    Telemetry: Sinus rhythm.  Converted last night.  Assessment/Plan:  1. Paroxsymal atrial fibrillation now in NSR 2. Acute resp failure and COPD  3. Hypotension improved  Rec:  Continue amiodarone for now     W. Ashley RoyaltySpencer Tilley, Jr.  MD [facility] Cardiology  12/10/2015, 10:04 AM

## 2015-12-10 NOTE — Progress Notes (Signed)
PULMONARY / CRITICAL CARE MEDICINE   Name: Stephen [name] MRN: [id number] DOB: 1941/09/12    ADMISSION DATE:  10-07-15 CONSULTATION DATE:  11/18/2015  REFERRING MD:  Dr August Saucerean, Ortho  CHIEF COMPLAINT:  Respiratory Distress  HISTORY OF PRESENT ILLNESS:   75 year old man with severe COPD, prednisone and oxygen dependent, atrial fibrillation on Coumadin, hypertension. He was admitted on 05/29/2016 with a right hip fracture after mechanical fall.  He went to the operating room on 3/19 for hip repair. In the PACU he was extubated with some difficulty but then continued to have significant increased work of breathing. PCCM asked to evaluate. Was on BiPAP temporarily and responded well. 3/24 early AM while on floor, developed resp distress again and would not tolerate BiPAP. Moved to ICU for intubation.   SUBJECTIVE: Patient intubated yesterday morning for acute respiratory failure. No other acute events overnight.  REVIEW OF SYSTEMS: Unable to obtain given intubation and sedation.  VITAL SIGNS: BP 104/70 mmHg  Pulse 79  Temp(Src) 98.3 F (36.8 C) (Axillary)  Resp 18  Ht 5\' 5"  (1.651 m)  Wt 59.104 kg (130 lb 4.8 oz)  BMI 21.68 kg/m2  SpO2 100%  HEMODYNAMICS:    VENTILATOR SETTINGS: Vent Mode:  [-] PRVC FiO2 (%):  [40 %] 40 % Set Rate:  [16 bmp] 16 bmp Vt Set:  [490 mL] 490 mL PEEP:  [5 cmH20] 5 cmH20 Plateau Pressure:  [15 cmH20-17 cmH20] 15 cmH20  INTAKE / OUTPUT: I/O last 3 completed shifts: In: 6374.1 [P.O.:100; I.V.:5274.1; IV Piggyback:1000] Out: 2250 [Urine:2250]  PHYSICAL EXAMINATION: General:  Sedated. No family at bedside.  Integument:  Warm & dry. No rash on exposed skin.  HEENT:  No scleral injection or icterus. Endotracheal tube in place.  Cardiovascular:  Regular rate. No edema. No appreciable JVD.  Pulmonary:  Clear to auscultation bilaterally. Symmetric chest wall rise on ventilator. Abdomen: Soft. Hypoactive bowel sounds. Mild abdominal  distention. Neurological: Not following commands. Pupils equal. Grimaces to pain.9  LABS:  BMET  Recent Labs Lab 12/08/15 0640 12/09/15 0454 12/10/15 0505  NA 140 141 141  K 4.9 4.7 5.0  CL 106 105 110  CO2 24 28 24   BUN 27* 38* 37*  CREATININE 1.27* 1.74* 1.58*  GLUCOSE 116* 98 139*    Electrolytes  Recent Labs Lab 12/06/15 0329 12/08/15 0640 12/09/15 0454 12/10/15 0505  CALCIUM 8.0* 8.4* 8.4* 7.5*  MG 2.2  --  1.9  --   PHOS 3.1  --   --   --     CBC  Recent Labs Lab 12/08/15 0640 12/09/15 0454 12/10/15 0505  WBC 16.0* 15.3* 23.7*  HGB 8.7* 8.8* 8.3*  HCT 26.6* 28.5* 25.1*  PLT 282 269 230    Coag's  Recent Labs Lab 12/08/15 0640 12/09/15 0454 12/10/15 0505  INR 2.63* 2.91* 3.82*    Sepsis Markers No results for input(s): LATICACIDVEN, PROCALCITON, O2SATVEN in the last 168 hours.  ABG  Recent Labs Lab 12/09/15 0415 12/09/15 0610  PHART 7.440 7.362  PCO2ART 40.2 47.7*  PO2ART 64.5* 460*    Liver Enzymes  Recent Labs Lab 009/08/2016 1622  AST 22  ALT 20  ALKPHOS 84  BILITOT 0.6  ALBUMIN 3.3*    Cardiac Enzymes  Recent Labs Lab 12/09/15 1030  TROPONINI 0.03    Glucose  Recent Labs Lab 12/06/15 1213 12/09/15 1230  GLUCAP 149* 164*    Imaging Dg Chest Port 1 View  12/09/2015  CLINICAL DATA:  Hypoxia.  Central  catheter placement EXAM: PORTABLE CHEST 1 VIEW COMPARISON:  Study obtained earlier in the day FINDINGS: Endotracheal tube tip is 3.1 cm above the carina. Left jugular catheter tip is in the superior vena cava. Nasogastric tube tip and side port are in the stomach. No pneumothorax. There is no demonstrable edema or consolidation. Heart is upper normal in size with pulmonary vascularity within normal limits. There is atherosclerotic calcification in the aortic arch region. No adenopathy evident. IMPRESSION: Tube and catheter positions as described without pneumothorax. No apparent edema or consolidation. Electronically  Signed   By: Bretta Bang III M.D.   On: 12/09/2015 09:30    STUDIES:  Port CXR 3/25:  Left internal jugular central venous catheter & endotracheal tube in good position. Minimal bilateral alveolar opacity unchanged. Mild blunting bilateral costophrenic angles.  MICROBIOLOGY: MRSA PCR 3/19:  Negative  ANTIBIOTICS: Clindamycin post procedure  SIGNIFICANT EVENTS: 3/19 - R Hip Repair  LINES/TUBES: OETT 7.5 3/24>>> L IJ CVL 3/24>>> OGT 3/24>>> Foley 3/24>>> PIV  ASSESSMENT / PLAN:  PULMONARY A: Acute Hypoxic Respiratory Failure H/O Severe COPD  P:   Full vent support Xopenex neb q6hr Change Solu-Medrol IV to q12hr O2 sat goal 88-92% Vent bundle  CARDIOVASCULAR A:  Hypotension - Resolved. Secondary to medications for intubation. H/O Atrial Fibrillation - RVR resolved after intubation. H/O HTN  P:  Telemetry monitoring Phenylephrine for MAP goal > Cardiology following Continue coumadin per pharmacy for a-fib. Continue amiodarone  RENAL A:   Acute on Chronic Renal Failure - Improving. H/O BPH  P:   NS MIVF @ 75cc/hr Trending UOP with foley Monitoring electrolytes & renal function daily Replace electrolytes as indicated Continue Flomax  GASTROINTESTINAL A:   Umbilical hernia  P:   Planned follow-up with [facility] as an outpatient NPO Dietician consult for tube feedings  HEMATOLOGIC A:   ABLA s/p surgery - Hgb Stable. Leukocytosis Coagulopathy - Chronic anticoagulation w/ Coumadin.  P:  Trending cell counts daily w/ CBC Transfuse for Hgb <7.0 or active bleeding Coumadin per pharmacy INR daily  INFECTIOUS A:   No evidence of active infection  P:   Prophylactic clindamycin Monitor for fever Checking Procalcitonin per algorithm given leukocytosis  ENDOCRINE A:   Hypothyroidism - TSH low. Hyperglycemia - Likely secondary to steroids.  P:   Holding Synthroid for now Accu-checks q4hr Low dose SSI per algorithm  NEUROLOGIC A:    Pain Control - Post hip surgery 3/19. Sedation on Ventilator   P:   RASS goal: 0 to -1 Precedex for sedation (BP could not tolerate propofol) Fentanyl gtt & IV prn D/C Morphine  FAMILY UPDATES: Daughter updated at bedside by Dr. Jamison Neighbor.   TODAY'S SUMMARY:  75 year old man with severe COPD with acute on chronic respiratory failure following right hip surgery. Initially ok on BiPAP and transferred out of ICU, then 3/24 developed resp distress again requiring intubation and ICU transfer. Remains critically ill. Continuing to wean sedation to assess neurological status and perform spontaneous breathing trial.  I have spent a total of 37 minutes of critical care time today caring for the patient and reviewing the patient's electronic medical record.  Donna Christen Jamison Neighbor, M.D. [facility] Pulmonary & Critical Care Pager:  [phone] After 3pm or if no response, call [phone] 12/10/2015 7:58 AM

## 2015-12-10 NOTE — Progress Notes (Signed)
ANTICOAGULATION CONSULT NOTE - Follow Up Consult  Pharmacy Consult for warfarin Indication: atrial fibrillation  Allergies  Allergen Reactions  . Penicillins Other (See Comments)    REACTION: feels drunk    Patient Measurements: Height: 5\' 5"  (165.1 cm) Weight: 130 lb 4.8 oz (59.104 kg) (Scale C) IBW/kg (Calculated) : 61.5  Vital Signs: Temp: 98.9 F (37.2 C) (03/25 1154) Temp Source: Axillary (03/25 1154) BP: 110/62 mmHg (03/25 1126) Pulse Rate: 84 (03/25 1126)  Labs:  Recent Labs  12/08/15 0640 12/09/15 0454 12/09/15 1030 12/10/15 0505  HGB 8.7* 8.8*  --  8.3*  HCT 26.6* 28.5*  --  25.1*  PLT 282 269  --  230  LABPROT 27.7* 29.9*  --  36.7*  INR 2.63* 2.91*  --  3.82*  CREATININE 1.27* 1.74*  --  1.58*  TROPONINI  --   --  0.03  --     Estimated Creatinine Clearance: 33.8 mL/min (by C-G formula based on Cr of 1.58).  Assessment: [age] YOM s/p R femoral nailing for subtrochanteric fracture (OR on 3/18) on warfarin for VTE prophylaxis (on warfarin PTA for AFib- home dose 2.5mg  daily except 5mg  on Mondays and Fridays- last dose PTA 3/17).   S/p 1mg  IV vitamin K on 3/18. INR now up to 3.82. Patient remains intubated.   Goal of Therapy:  INR 2-3   Plan:  - Hold warfarin tonight  - Daily INR  Vinnie LevelBenjamin Vandell Kun, PharmD., BCPS Clinical Pharmacist Pager [phone]

## 2015-12-11 ENCOUNTER — Inpatient Hospital Stay (HOSPITAL_COMMUNITY): Payer: PPO

## 2015-12-11 DIAGNOSIS — G9340 Encephalopathy, unspecified: Secondary | ICD-10-CM

## 2015-12-11 LAB — BLOOD GAS, ARTERIAL
ACID-BASE DEFICIT: 5.8 mmol/L — AB (ref 0.0–2.0)
BICARBONATE: 20.1 meq/L (ref 20.0–24.0)
Drawn by: 245131
FIO2: 40
LHR: 16 {breaths}/min
O2 SAT: 86.6 %
PATIENT TEMPERATURE: 98.6
PCO2 ART: 46.2 mmHg — AB (ref 35.0–45.0)
PEEP: 5 cmH2O
PH ART: 7.261 — AB (ref 7.350–7.450)
TCO2: 21.5 mmol/L (ref 0–100)
VT: 490 mL
pO2, Arterial: 61.2 mmHg — ABNORMAL LOW (ref 80.0–100.0)

## 2015-12-11 LAB — MAGNESIUM: MAGNESIUM: 2.2 mg/dL (ref 1.7–2.4)

## 2015-12-11 LAB — CBC
HEMATOCRIT: 24.8 % — AB (ref 39.0–52.0)
HEMOGLOBIN: 7.9 g/dL — AB (ref 13.0–17.0)
MCH: 29.9 pg (ref 26.0–34.0)
MCHC: 31.9 g/dL (ref 30.0–36.0)
MCV: 93.9 fL (ref 78.0–100.0)
Platelets: 300 10*3/uL (ref 150–400)
RBC: 2.64 MIL/uL — ABNORMAL LOW (ref 4.22–5.81)
RDW: 16.6 % — ABNORMAL HIGH (ref 11.5–15.5)
WBC: 19.1 10*3/uL — AB (ref 4.0–10.5)

## 2015-12-11 LAB — RENAL FUNCTION PANEL
Albumin: 2 g/dL — ABNORMAL LOW (ref 3.5–5.0)
Anion gap: 9 (ref 5–15)
BUN: 43 mg/dL — ABNORMAL HIGH (ref 6–20)
CO2: 21 mmol/L — ABNORMAL LOW (ref 22–32)
Calcium: 7.7 mg/dL — ABNORMAL LOW (ref 8.9–10.3)
Chloride: 111 mmol/L (ref 101–111)
Creatinine, Ser: 1.53 mg/dL — ABNORMAL HIGH (ref 0.61–1.24)
GFR calc Af Amer: 50 mL/min — ABNORMAL LOW (ref >60)
GFR calc non Af Amer: 43 mL/min — ABNORMAL LOW (ref >60)
Glucose, Bld: 116 mg/dL — ABNORMAL HIGH (ref 65–99)
Phosphorus: 4.1 mg/dL (ref 2.5–4.6)
Potassium: 4.3 mmol/L (ref 3.5–5.1)
Sodium: 141 mmol/L (ref 135–145)

## 2015-12-11 LAB — CBC WITH DIFFERENTIAL/PLATELET
BASOS ABS: 0 10*3/uL (ref 0.0–0.1)
Basophils Relative: 0 %
EOS ABS: 0 10*3/uL (ref 0.0–0.7)
EOS PCT: 0 %
HCT: 25.4 % — ABNORMAL LOW (ref 39.0–52.0)
Hemoglobin: 8 g/dL — ABNORMAL LOW (ref 13.0–17.0)
LYMPHS PCT: 3 %
Lymphs Abs: 0.7 10*3/uL (ref 0.7–4.0)
MCH: 29.5 pg (ref 26.0–34.0)
MCHC: 31.5 g/dL (ref 30.0–36.0)
MCV: 93.7 fL (ref 78.0–100.0)
MONO ABS: 0.6 10*3/uL (ref 0.1–1.0)
Monocytes Relative: 3 %
Neutro Abs: 18.3 10*3/uL — ABNORMAL HIGH (ref 1.7–7.7)
Neutrophils Relative %: 94 %
PLATELETS: 278 10*3/uL (ref 150–400)
RBC: 2.71 MIL/uL — AB (ref 4.22–5.81)
RDW: 16.3 % — AB (ref 11.5–15.5)
WBC: 19.5 10*3/uL — AB (ref 4.0–10.5)

## 2015-12-11 LAB — GLUCOSE, CAPILLARY
GLUCOSE-CAPILLARY: 102 mg/dL — AB (ref 65–99)
GLUCOSE-CAPILLARY: 132 mg/dL — AB (ref 65–99)
GLUCOSE-CAPILLARY: 95 mg/dL (ref 65–99)
Glucose-Capillary: 105 mg/dL — ABNORMAL HIGH (ref 65–99)
Glucose-Capillary: 112 mg/dL — ABNORMAL HIGH (ref 65–99)
Glucose-Capillary: 116 mg/dL — ABNORMAL HIGH (ref 65–99)
Glucose-Capillary: 104 mg/dL — ABNORMAL HIGH (ref 65–99)

## 2015-12-11 LAB — PROCALCITONIN: PROCALCITONIN: 5.22 ng/mL

## 2015-12-11 LAB — PROTIME-INR
INR: 3.61 — ABNORMAL HIGH (ref 0.00–1.49)
Prothrombin Time: 35.2 seconds — ABNORMAL HIGH (ref 11.6–15.2)

## 2015-12-11 MED ORDER — SENNOSIDES 8.8 MG/5ML PO SYRP
5.0000 mL | ORAL_SOLUTION | Freq: Two times a day (BID) | ORAL | Status: DC
  Administered 2015-12-11 – 2015-12-17 (×11): 5 mL via ORAL
  Filled 2015-12-11 (×12): qty 5

## 2015-12-11 MED ORDER — HYDROCORTISONE NA SUCCINATE PF 100 MG IJ SOLR
100.0000 mg | Freq: Two times a day (BID) | INTRAMUSCULAR | Status: DC
  Administered 2015-12-11 – 2015-12-17 (×13): 100 mg via INTRAVENOUS
  Filled 2015-12-11 (×14): qty 2

## 2015-12-11 MED ORDER — SODIUM CHLORIDE 0.9 % IV BOLUS (SEPSIS)
1000.0000 mL | Freq: Once | INTRAVENOUS | Status: AC
  Administered 2015-12-11: 1000 mL via INTRAVENOUS

## 2015-12-11 MED ORDER — AMIODARONE HCL 200 MG PO TABS
200.0000 mg | ORAL_TABLET | Freq: Two times a day (BID) | ORAL | Status: DC
  Administered 2015-12-11 – 2015-12-14 (×4): 200 mg via ORAL
  Filled 2015-12-11 (×4): qty 1

## 2015-12-11 NOTE — Progress Notes (Signed)
ANTICOAGULATION CONSULT NOTE - Follow Up Consult  Pharmacy Consult for warfarin Indication: atrial fibrillation  Allergies  Allergen Reactions  . Penicillins Other (See Comments)    REACTION: feels drunk    Patient Measurements: Height: 5\' 5"  (165.1 cm) Weight: 130 lb 4.8 oz (59.104 kg) (Scale C) IBW/kg (Calculated) : 61.5  Vital Signs: Temp: 96.8 F (36 C) (03/26 0757) Temp Source: Axillary (03/26 0757) BP: 105/80 mmHg (03/26 0800) Pulse Rate: 95 (03/26 0800)  Labs:  Recent Labs  12/09/15 0454 12/09/15 1030 12/10/15 0505 12/11/15 0545  HGB 8.8*  --  8.3* 8.0*  HCT 28.5*  --  25.1* 25.4*  PLT 269  --  230 278  LABPROT 29.9*  --  36.7* 35.2*  INR 2.91*  --  3.82* 3.61*  CREATININE 1.74*  --  1.58* 1.53*  TROPONINI  --  0.03  --   --     Estimated Creatinine Clearance: 34.9 mL/min (by C-G formula based on Cr of 1.53).  Assessment: [age] YOM s/p R femoral nailing for subtrochanteric fracture (OR on 3/18) on warfarin for VTE prophylaxis (on warfarin PTA for AFib- home dose 2.5mg  daily except 5mg  on Mondays and Fridays- last dose PTA 3/17).   S/p 1mg  IV vitamin K on 3/18. INR down to 3.61 after 1 held dose. Likely due to interaction with amiodarone. Patient remains intubated.   Goal of Therapy:  INR 2-3   Plan:  - Hold warfarin tonight  - Daily INR  Vinnie LevelBenjamin Shomari Scicchitano, PharmD., BCPS Clinical Pharmacist Pager [phone]

## 2015-12-11 NOTE — Progress Notes (Signed)
eLink Physician-Brief Progress Note Patient Name: Stephen [name] DOB: 1940-11-26 MRN: [id number]   Date of Service  12/11/2015  HPI/Events of Note  bp down/ no change in pulse Off neo from earlier and hope to avoid restart   Intake/Output Summary (Last 24 hours) at 12/11/15 1925 Last data filed at 12/11/15 1900  Gross per 24 hour  Intake 824.98 ml  Output    780 ml  Net  44.98 ml     eICU Interventions  NS bolus/ recheck cbc      Intervention Category Major Interventions: Shock - evaluation and management  Sandrea HughsMichael Wert 12/11/2015, 7:24 PM

## 2015-12-11 NOTE — Progress Notes (Signed)
Subjective:  Remains intubated and is sedated today.  He is back in atrial fibrillation.  Objective:  Vital Signs in the last 24 hours: BP 105/80 mmHg  Pulse 95  Temp(Src) 96.8 F (36 C) (Axillary)  Resp 19  Ht 5\' 5"  (1.651 m)  Wt 59.104 kg (130 lb 4.8 oz)  BMI 21.68 kg/m2  SpO2 99%  Physical Exam: Intubated WM in NAD Lungs:  Reduced BS Cardiac:  Irregular rhythm, normal S1 and S2, no S3 Extremities:  No edema present  Intake/Output from previous day: 03/25 0701 - 03/26 0700 In: 1690.3 [I.V.:1690.3] Out: 895 [Urine:895] Weight Filed Weights   12/06/15 0441 12/07/15 0243 12/08/15 0419  Weight: 61.598 kg (135 lb 12.8 oz) 58.423 kg (128 lb 12.8 oz) 59.104 kg (130 lb 4.8 oz)    Lab Results: Basic Metabolic Panel:  Recent Labs  16/06/9602/25/17 0505 12/11/15 0545  NA 141 141  K 5.0 4.3  CL 110 111  CO2 24 21*  GLUCOSE 139* 116*  BUN 37* 43*  CREATININE 1.58* 1.53*    CBC:  Recent Labs  12/10/15 0505 12/11/15 0545  WBC 23.7* 19.5*  NEUTROABS  --  18.3*  HGB 8.3* 8.0*  HCT 25.1* 25.4*  MCV 93.3 93.7  PLT 230 278    BNP No results found for: BNP  PROTIME: Lab Results  Component Value Date   INR 3.61* 12/11/2015   INR 3.82* 12/10/2015   INR 2.91* 12/09/2015    Telemetry: Currently paroxysmal atrial fibrillation rate mildly elevated  Assessment/Plan:  1. Paroxsymal atrial fibrillation now in NSR 2. Acute resp failure and COPD -intubated  3. Hypotension improved  Rec:  he is back in atrial fibrillation.  May need to increase the dose of amiodarone or place back on intravenous if persists.   Darden PalmerW. Spencer Tilley, Jr.  MD [facility] Cardiology  12/11/2015, 10:09 AM

## 2015-12-11 NOTE — Progress Notes (Addendum)
PULMONARY / CRITICAL CARE MEDICINE   Name: Stephen [name] MRN: [id number] DOB: 04-03-1941    ADMISSION DATE:  12/09/2015 CONSULTATION DATE:  12/24/2015  REFERRING MD:  Dr August Saucer, Ortho  CHIEF COMPLAINT:  Respiratory Distress  HISTORY OF PRESENT ILLNESS:   75 year old man with severe COPD, prednisone and oxygen dependent, atrial fibrillation on Coumadin, hypertension. He was admitted on 12/15/2015 with a right hip fracture after mechanical fall.  He went to the operating room on 3/19 for hip repair. In the PACU he was extubated with some difficulty but then continued to have significant increased work of breathing. PCCM asked to evaluate. Was on BiPAP temporarily and responded well. 3/24 early AM while on floor, developed resp distress again and would not tolerate BiPAP. Moved to ICU for intubation.   SUBJECTIVE: No acute events overnight. Patient still has not had a bowel movement.  REVIEW OF SYSTEMS: Unable to obtain given intubation and sedation.  VITAL SIGNS: BP 105/80 mmHg  Pulse 95  Temp(Src) 96.8 F (36 C) (Axillary)  Resp 19  Ht  (1.651 m)  Wt 59.104 kg (130 lb 4.8 oz)  BMI 21.68 kg/m2  SpO2 99%  HEMODYNAMICS:    VENTILATOR SETTINGS: Vent Mode:  [-] PRVC FiO2 (%):  [40 %] 40 % Set Rate:  [16 bmp] 16 bmp Vt Set:  [490 mL] 490 mL PEEP:  [5 cmH20] 5 cmH20 Pressure Support:  [5 cmH20] 5 cmH20 Plateau Pressure:  [14 cmH20-18 cmH20] 18 cmH20  INTAKE / OUTPUT: I/O last 3 completed shifts: In: 2867.8 [I.V.:2867.8] Out: 1295 [Urine:1295]  PHYSICAL EXAMINATION: General:  Sedated. No family at bedside. Infrequent twitching. Integument:  Warm & dry. No rash on exposed skin.  HEENT:  No scleral injection. Endotracheal tube in place.  Cardiovascular:  Regular rate. No edema. No appreciable JVD.  Pulmonary:  Clear to auscultation bilaterally. Symmetric chest wall rise on ventilator. Abdomen: Soft. Hypoactive bowel sounds. Mild abdominal distention persists. Neurological:  Patient opens eyes to voice. Grimaces to pain in extremities. Still not following commands. Rare intermittent twitch.  LABS:  BMET  Recent Labs Lab 12/08/15 0640 12/09/15 0454 12/10/15 0505  NA 140 141 141  K 4.9 4.7 5.0  CL 106 105 110  CO2 BUN 27* 38* 37*  CREATININE 1.27* 1.74* 1.58*  GLUCOSE 116* 98 139*    Electrolytes  Recent Labs Lab 12/06/15 0329 12/08/15 0640 12/09/15 0454 12/10/15 0505  CALCIUM 8.0* 8.4* 8.4* 7.5*  MG 2.2  --  1.9  --   PHOS 3.1  --   --   --     CBC  Recent Labs Lab 12/09/15 0454 12/10/15 0505 12/11/15 0545  WBC 15.3* 23.7* 19.5*  HGB 8.8* 8.3* 8.0*  HCT 28.5* 25.1* 25.4*  PLT 269 230 278    Coag's  Recent Labs Lab 12/09/15 0454 12/10/15 0505 12/11/15 0545  INR 2.91* 3.82* 3.61*    Sepsis Markers  Recent Labs Lab 12/10/15 0930 12/11/15 0545  PROCALCITON 7.82 5.22    ABG  Recent Labs Lab 12/09/15 0415 12/09/15 0610  PHART 7.440 7.362  PCO2ART 40.2 47.7*  PO2ART 64.5* 460*    Liver Enzymes No results for input(s): AST, ALT, ALKPHOS, BILITOT, ALBUMIN in the last 168 hours.  Cardiac Enzymes  Recent Labs Lab 12/09/15 1030  TROPONINI 0.03    Glucose  Recent Labs Lab 12/10/15 1122 12/10/15 1613 12/10/15 1947 12/11/15 0033 12/11/15 0410 12/11/15 0752  GLUCAP 117* 114* 125* 105* 112* 116*  Imaging No results found.  STUDIES:  Port CXR 3/25:  Left internal jugular central venous catheter & endotracheal tube in good position. Minimal bilateral alveolar opacity unchanged. Mild blunting bilateral costophrenic angles.  MICROBIOLOGY: MRSA PCR 3/19:  Negative  ANTIBIOTICS: Clindamycin post procedure  SIGNIFICANT EVENTS: 3/19 - R Hip Repair  LINES/TUBES: OETT 7.5 3/24>>> L IJ CVL 3/24>>> OGT 3/24>>> Foley 3/24>>> PIV  ASSESSMENT / PLAN:  PULMONARY A: Acute on Chronic Hypoxic Respiratory Failure H/O Severe COPD  P:   Full vent support Xopenex neb q6hr Atrovent neb  q6hr Solu-Medrol IV to q12hr O2 sat goal 88-92% Vent bundle  CARDIOVASCULAR A:  Hypotension - Resolved. Secondary to medications for intubation. H/O Atrial Fibrillation - RVR resolved after intubation. H/O HTN  P:  Appreciate Cardiology recommendations Telemetry monitoring Phenylephrine for MAP goal > 65mmHg Continue coumadin per pharmacy for a-fib. Continue amiodarone  RENAL A:   Acute on Chronic Renal Failure - Improving. H/O BPH  P:   NS MIVF @ 75cc/hr Trending UOP with foley Monitoring electrolytes & renal function daily Replace electrolytes as indicated Continue Flomax  GASTROINTESTINAL A:   Umbilical hernia Constipation   P:   Dulcolax PR & Senna bid Planned follow-up with [facility] as an outpatient NPO KUB now  HEMATOLOGIC A:   ABLA s/p surgery - Hgb Stable. Leukocytosis - Improving. Coagulopathy - Chronic anticoagulation w/ Coumadin.  P:  Trending cell counts daily w/ CBC Transfuse for Hgb <7.0 or active bleeding Coumadin per pharmacy INR daily  INFECTIOUS A:   No evidence of active infection  P:   Prophylactic Clindamycin Monitor for fever Procalcitonin per algorithm Low threshold to culture & start antibiotics  ENDOCRINE A:   Hypothyroidism - TSH low. Hyperglycemia - Likely secondary to steroids.  P:   Holding Synthroid for now Accu-checks q4hr Low dose SSI per algorithm  NEUROLOGIC A:   Acute Encephalopathy - Likely due to sedation. Pain Control - Post hip surgery 3/19. Sedation on Ventilator   P:   RASS goal: 0 to -1 Precedex for sedation (BP could not tolerate propofol) Fentanyl gtt & IV prn  FAMILY UPDATES: Daughter updated at bedside by Dr. Jamison NeighborNestor on 3/25.   TODAY'S SUMMARY:  75 year old man with severe COPD with acute on chronic respiratory failure following right hip surgery. Initially ok on BiPAP and transferred out of ICU, then 3/24 developed resp distress again requiring intubation and ICU transfer. Level of sedation  is main barrier to extubation at this time.  I have spent a total of 34 minutes of critical care time today caring for the patient and reviewing the patient's electronic medical record.  Donna ChristenJennings E. Jamison NeighborNestor, M.D. [facility] Pager:  [phone] After 3pm or if no response, call [phone] 12/11/2015 8:56 AM

## 2015-12-11 NOTE — Progress Notes (Addendum)
eLink Physician-Brief Progress Note Patient Name: Stephen [name] DOB: 07/07/1941 MRN: [id number]   Date of Service  12/11/2015  HPI/Events of Note  Still with low bp, low uop p one liter of fluid/ cbc pending  eICU Interventions  Repeat fluid bolus/ rx restart Neo  Change solumedrol to cortef      Intervention Category Major Interventions: Shock - evaluation and management  Sandrea HughsMichael Krishang Reading 12/11/2015, 9:44 PM

## 2015-12-12 ENCOUNTER — Inpatient Hospital Stay (HOSPITAL_COMMUNITY): Payer: PPO

## 2015-12-12 DIAGNOSIS — Z7901 Long term (current) use of anticoagulants: Secondary | ICD-10-CM

## 2015-12-12 LAB — GLUCOSE, CAPILLARY
GLUCOSE-CAPILLARY: 72 mg/dL (ref 65–99)
Glucose-Capillary: 98 mg/dL (ref 65–99)
Glucose-Capillary: 145 mg/dL — ABNORMAL HIGH (ref 65–99)
Glucose-Capillary: 95 mg/dL (ref 65–99)
Glucose-Capillary: 75 mg/dL (ref 65–99)
Glucose-Capillary: 84 mg/dL (ref 65–99)

## 2015-12-12 LAB — CBC WITH DIFFERENTIAL/PLATELET
BASOS ABS: 0 10*3/uL (ref 0.0–0.1)
Basophils Relative: 0 %
EOS ABS: 0 10*3/uL (ref 0.0–0.7)
EOS PCT: 0 %
HCT: 25.5 % — ABNORMAL LOW (ref 39.0–52.0)
Hemoglobin: 8.2 g/dL — ABNORMAL LOW (ref 13.0–17.0)
LYMPHS ABS: 0.2 10*3/uL — AB (ref 0.7–4.0)
LYMPHS PCT: 1 %
MCH: 30.1 pg (ref 26.0–34.0)
MCHC: 32.2 g/dL (ref 30.0–36.0)
MCV: 93.8 fL (ref 78.0–100.0)
Monocytes Absolute: 1.2 10*3/uL — ABNORMAL HIGH (ref 0.1–1.0)
Monocytes Relative: 6 %
NEUTROS PCT: 93 %
Neutro Abs: 20.7 10*3/uL — ABNORMAL HIGH (ref 1.7–7.7)
PLATELETS: 334 10*3/uL (ref 150–400)
RBC: 2.72 MIL/uL — AB (ref 4.22–5.81)
RDW: 16.8 % — ABNORMAL HIGH (ref 11.5–15.5)
WBC: 22.1 10*3/uL — AB (ref 4.0–10.5)

## 2015-12-12 LAB — BLOOD GAS, ARTERIAL
ACID-BASE DEFICIT: 7.8 mmol/L — AB (ref 0.0–2.0)
Acid-base deficit: 9 mmol/L — ABNORMAL HIGH (ref 0.0–2.0)
Acid-base deficit: 4.5 mmol/L — ABNORMAL HIGH (ref 0.0–2.0)
BICARBONATE: 18.5 meq/L — AB (ref 20.0–24.0)
Bicarbonate: 18 mEq/L — ABNORMAL LOW (ref 20.0–24.0)
Bicarbonate: 20.9 mEq/L (ref 20.0–24.0)
DRAWN BY: 39899
Drawn by: 39899
Drawn by: 398991
FIO2: 0.6
FIO2: 0.8
FIO2: 0.6
LHR: 20 {breaths}/min
MECHVT: 490 mL
MECHVT: 490 mL
MECHVT: 490 mL
O2 SAT: 96.2 %
O2 SAT: 94.4 %
O2 Saturation: 88.1 %
PATIENT TEMPERATURE: 97.4
PCO2 ART: 42.8 mmHg (ref 35.0–45.0)
PEEP/CPAP: 10 cmH2O
PEEP: 5 cmH2O
PEEP: 8 cmH2O
PIP: 16 cmH2O
PO2 ART: 93.9 mmHg (ref 80.0–100.0)
PO2 ART: 74.6 mmHg — AB (ref 80.0–100.0)
Patient temperature: 98.6
Patient temperature: 97.3
RATE: 16 resp/min
RATE: 20 resp/min
TCO2: 19.5 mmol/L (ref 0–100)
TCO2: 19.9 mmol/L (ref 0–100)
TCO2: 22.3 mmol/L (ref 0–100)
pCO2 arterial: 50.6 mmHg — ABNORMAL HIGH (ref 35.0–45.0)
pCO2 arterial: 44.6 mmHg (ref 35.0–45.0)
pH, Arterial: 7.176 — CL (ref 7.350–7.450)
pH, Arterial: 7.236 — ABNORMAL LOW (ref 7.350–7.450)
pH, Arterial: 7.306 — ABNORMAL LOW (ref 7.350–7.450)
pO2, Arterial: 68.3 mmHg — ABNORMAL LOW (ref 80.0–100.0)

## 2015-12-12 LAB — RENAL FUNCTION PANEL
Albumin: 2 g/dL — ABNORMAL LOW (ref 3.5–5.0)
Anion gap: 8 (ref 5–15)
BUN: 54 mg/dL — ABNORMAL HIGH (ref 6–20)
CO2: 20 mmol/L — ABNORMAL LOW (ref 22–32)
Calcium: 7.3 mg/dL — ABNORMAL LOW (ref 8.9–10.3)
Chloride: 115 mmol/L — ABNORMAL HIGH (ref 101–111)
Creatinine, Ser: 2.04 mg/dL — ABNORMAL HIGH (ref 0.61–1.24)
GFR calc Af Amer: 35 mL/min — ABNORMAL LOW (ref >60)
GFR calc non Af Amer: 30 mL/min — ABNORMAL LOW (ref >60)
Glucose, Bld: 114 mg/dL — ABNORMAL HIGH (ref 65–99)
Phosphorus: 4.9 mg/dL — ABNORMAL HIGH (ref 2.5–4.6)
Potassium: 4.6 mmol/L (ref 3.5–5.1)
Sodium: 143 mmol/L (ref 135–145)

## 2015-12-12 LAB — PROTIME-INR
INR: 4.43 — ABNORMAL HIGH (ref 0.00–1.49)
Prothrombin Time: 41 seconds — ABNORMAL HIGH (ref 11.6–15.2)

## 2015-12-12 LAB — OCCULT BLOOD X 1 CARD TO LAB, STOOL: Fecal Occult Bld: NEGATIVE

## 2015-12-12 LAB — PROCALCITONIN: PROCALCITONIN: 3.57 ng/mL

## 2015-12-12 LAB — MAGNESIUM: MAGNESIUM: 2.2 mg/dL (ref 1.7–2.4)

## 2015-12-12 MED ORDER — VITAL HIGH PROTEIN PO LIQD
1000.0000 mL | ORAL | Status: DC
  Administered 2015-12-12: 1000 mL

## 2015-12-12 MED ORDER — VITAL AF 1.2 CAL PO LIQD
1000.0000 mL | ORAL | Status: DC
  Administered 2015-12-12 – 2015-12-18 (×7): 1000 mL

## 2015-12-12 MED ORDER — DEXTROSE 5 % IV SOLN
1.0000 g | INTRAVENOUS | Status: DC
  Administered 2015-12-12 – 2015-12-17 (×6): 1 g via INTRAVENOUS
  Filled 2015-12-12 (×7): qty 1

## 2015-12-12 MED ORDER — FENTANYL CITRATE (PF) 100 MCG/2ML IJ SOLN
25.0000 ug | INTRAMUSCULAR | Status: DC | PRN
  Administered 2015-12-12 (×2): 50 ug via INTRAVENOUS
  Filled 2015-12-12 (×2): qty 2

## 2015-12-12 MED ORDER — STERILE WATER FOR INJECTION IV SOLN
INTRAVENOUS | Status: DC
  Administered 2015-12-12 – 2015-12-13 (×2): via INTRAVENOUS
  Filled 2015-12-12 (×13): qty 850

## 2015-12-12 MED ORDER — NOREPINEPHRINE BITARTRATE 1 MG/ML IV SOLN
2.0000 ug/min | INTRAVENOUS | Status: DC
  Filled 2015-12-12 (×2): qty 4

## 2015-12-12 MED ORDER — SODIUM BICARBONATE 8.4 % IV SOLN
100.0000 meq | Freq: Once | INTRAVENOUS | Status: AC
  Administered 2015-12-12: 100 meq via INTRAVENOUS
  Filled 2015-12-12: qty 100

## 2015-12-12 NOTE — Progress Notes (Signed)
Called MD to inform him of Low urine output over last 8 hours.

## 2015-12-12 NOTE — Procedures (Signed)
Arterial Catheter Insertion Procedure Note Stephen [name] [id number] 03-02-1941  Procedure: Insertion of Arterial Catheter  Indications: Blood pressure monitoring and Frequent blood sampling  Procedure Details Consent: Unable to obtain consent because of patient on ventilator. Time Out: Verified patient identification, verified procedure, site/side was marked, verified correct patient position, special equipment/implants available, medications/allergies/relevent history reviewed, required imaging and test results available.  Performed  Maximum sterile technique was used including antiseptics, cap, gloves, gown, hand hygiene, mask and sheet. Skin prep: Chlorhexidine; local anesthetic administered 20 gauge catheter was inserted into left radial artery using the Seldinger technique.  Evaluation Blood flow good; BP tracing good. Complications: No apparent complications.   Durwin GlazeBrown, Shelia Kingsberry N 12/12/2015

## 2015-12-12 NOTE — Progress Notes (Signed)
       Patient Name: Stephen [name] Date of Encounter: 12/12/2015    SUBJECTIVE: Intubated and lethargic.  TELEMETRY:  Paroxysmal atrial fib versus multifocal atrial tachycardia. Most rapid recent rates have been around 118 bpm. There has been intermittent atrial fibrillation also noted. Filed Vitals:   12/12/15 0900 12/12/15 1000 12/12/15 1032 12/12/15 1116  BP: 104/62 95/58 77/47  133/47  Pulse: 87 79 85 101  Temp:      TempSrc:      Resp: 16 16 18 24   Height:      Weight:      SpO2: 100% 99% 88% 92%    Intake/Output Summary (Last 24 hours) at 12/12/15 1137 Last data filed at 12/12/15 0940  Gross per 24 hour  Intake 3636.15 ml  Output    665 ml  Net 2971.15 ml   LABS: Basic Metabolic Panel:  Recent Labs  82/95/6203/26/17 0545 12/12/15 0515  NA 141 143  K 4.3 4.6  CL 111 115*  CO2 21* 20*  GLUCOSE 116* 114*  BUN 43* 54*  CREATININE 1.53* 2.04*  CALCIUM 7.7* 7.3*  MG 2.2 2.2  PHOS 4.1 4.9*   CBC:  Recent Labs  12/11/15 0545 12/11/15 2100 12/12/15 0515  WBC 19.5* 19.1* 22.1*  NEUTROABS 18.3*  --  20.7*  HGB 8.0* 7.9* 8.2*  HCT 25.4* 24.8* 25.5*  MCV 93.7 93.9 93.8  PLT 278 300 334   Radiology/Studies:  No new data  Physical Exam: Blood pressure 133/47, pulse 101, temperature 97.4 F (36.3 C), temperature source Axillary, resp. rate 24, height 5\' 5"  (1.651 m), weight 130 lb 4.8 oz (59.104 kg), SpO2 92 %. Weight change:   Wt Readings from Last 3 Encounters:  12/08/15 130 lb 4.8 oz (59.104 kg)  07/19/15 125 lb (56.7 kg)  01/27/15 111 lb (50.349 kg)    Chest is clear anteriorly. Background noise related to ventilator support. Cardiac exam reveals an irregular rhythm There is no peripheral edema  ASSESSMENT:  1. Patient has a history of paroxysmal atrial fibrillation managed chronically on amiodarone and Coumadin therapy. Currently on oral amiodarone. He is hemodynamically stable.  RECOMMENDATIONS:  1. Continue amiodarone 2. Will follow and  advise as necessary but no specific changes for atrial fibrillation seem indicated at this time.  Selinda EonSigned, Latera Mclin III,Jessly Lebeck W 12/12/2015, 11:37 AM

## 2015-12-12 NOTE — Progress Notes (Signed)
Fentanyl drip remainder wasted in sink (50 cc) with Verneda SkillElaine Thielen, RN as witness.

## 2015-12-12 NOTE — Progress Notes (Signed)
eLink Physician-Brief Progress Note Patient Name: Stephen NovelDon [name] DOB: August 30, 1941 MRN: [id number]   Date of Service  12/12/2015  HPI/Events of Note  ABG on 80%/PRVC 20/TV 490/P 8 = 7.23/44.5/93/18.5. Patient with air trapping (auto PEEP)  on rates of 26, 24 and 22. Therefore, can't increase the ventilator rate.   eICU Interventions  Will order: 1. Increase PEEP to 10. 2. NaHCO3 100 meq IV now.  3. NaHCO3 IV infusion at 50 mL/hour. 4. ABG at 6 PM.     Intervention Category Major Interventions: Acid-Base disturbance - evaluation and management;Respiratory failure - evaluation and management  Lenell AntuSommer,[name] 12/12/2015, 4:45 PM

## 2015-12-12 NOTE — Progress Notes (Signed)
ANTICOAGULATION CONSULT NOTE - Follow Up Consult  Pharmacy Consult for warfarin Indication: atrial fibrillation  Allergies  Allergen Reactions  . Penicillins Other (See Comments)    REACTION: feels drunk    Patient Measurements: Height: 5\' 5"  (165.1 cm) Weight: 130 lb 4.8 oz (59.104 kg) (Scale C) IBW/kg (Calculated) : 61.5  Vital Signs: Temp: 98.8 F (37.1 C) (03/27 0400) Temp Source: Axillary (03/27 0400) BP: 126/66 mmHg (03/27 0645) Pulse Rate: 77 (03/27 0645)  Labs:  Recent Labs  12/09/15 1030  12/10/15 0505 12/11/15 0545 12/11/15 2100 12/12/15 0515  HGB  --   < > 8.3* 8.0* 7.9* 8.2*  HCT  --   < > 25.1* 25.4* 24.8* 25.5*  PLT  --   < > 230 278 300 334  LABPROT  --   --  36.7* 35.2*  --  41.0*  INR  --   --  3.82* 3.61*  --  4.43*  CREATININE  --   --  1.58* 1.53*  --  2.04*  TROPONINI 0.03  --   --   --   --   --   < > = values in this interval not displayed.  Estimated Creatinine Clearance: 26.2 mL/min (by C-G formula based on Cr of 2.04).  Assessment: [age] YOM s/p R femoral nailing for subtrochanteric fracture (OR on 3/18) on warfarin for VTE prophylaxis (on warfarin PTA for AFib- home dose 2.5mg  daily except 5mg  on Mondays and Fridays- last dose PTA 3/17).   INR up to 4.43 with last dose warfarin received on 3/24 pm. Likely due to interaction with amiodarone. CBC stable with no sxs of bleeding.  Goal of Therapy:  INR 2-3   Plan:  - Hold warfarin again tonight  - Daily INR - monitor for sxs of bleeding  - CMP for 3/28 am    Pollyann SamplesAndy March Joos, PharmD, BCPS 12/12/2015, 7:38 AM Pager: [phone]

## 2015-12-12 NOTE — Progress Notes (Signed)
PULMONARY / CRITICAL CARE MEDICINE   Name: Stephen [name] MRN: [id number] DOB: 08/12/1941    ADMISSION DATE:  2015/12/19 CONSULTATION DATE:  12/12/2015  REFERRING MD:  Dr August Saucer, Ortho  CHIEF COMPLAINT:  Respiratory Distress  HISTORY OF PRESENT ILLNESS:   75 year old man with severe COPD, prednisone and oxygen dependent, atrial fibrillation on Coumadin, hypertension. He was admitted on 12-19-15 with a right hip fracture after mechanical fall.  He went to the operating room on 3/19 for hip repair. In the PACU he was extubated with some difficulty but then continued to have significant increased work of breathing. PCCM asked to evaluate. Was on BiPAP temporarily and responded well. 3/24 early AM while on floor, developed resp distress again and would not tolerate BiPAP. Moved to ICU for intubation.   SUBJECTIVE:  Afebrile Back on pressors Secretions ++ UO poor  REVIEW OF SYSTEMS: Unable to obtain given intubation and sedation.  VITAL SIGNS: BP 104/62 mmHg  Pulse 87  Temp(Src) 97.4 F (36.3 C) (Axillary)  Resp 16  Ht  (1.651 m)  Wt 130 lb 4.8 oz (59.104 kg)  BMI 21.68 kg/m2  SpO2 100%  HEMODYNAMICS: CVP:  [12 mmHg-26 mmHg] 15 mmHg  VENTILATOR SETTINGS: Vent Mode:  [-] PRVC FiO2 (%):  [40 %-60 %] 60 % Set Rate:  [16 bmp] 16 bmp Vt Set:  [490 mL] 490 mL PEEP:  [5 cmH20] 5 cmH20 Plateau Pressure:  [17 cmH20-24 cmH20] 17 cmH20  INTAKE / OUTPUT: I/O last 3 completed shifts: In: 4017.2 [I.V.:2017.2; IV Piggyback:2000] Out: 1245 [Urine:1245]  PHYSICAL EXAMINATION: General:  Sedated. Chr ill appearing Integument:  Warm & dry. No rash on exposed skin.  HEENT:  No scleral injection or icterus. Endotracheal tube in place.  Cardiovascular:  Regular rate. No edema. No appreciable JVD.  Pulmonary:  Clear to auscultation bilaterally. Symmetric chest wall rise on ventilator. Abdomen: Soft. Hypoactive bowel sounds. Mild abdominal distention. Neurological: RASS-2 on fent gtt, Not  following commands. Pupils equal. Grimaces to pain.9  LABS:  BMET  Recent Labs Lab 12/10/15 0505 12/11/15 0545 12/12/15 0515  NA 141 141 143  K 5.0 4.3 4.6  CL 110 111 115*  CO2 24 21* 20*  BUN 37* 43* 54*  CREATININE 1.58* 1.53* 2.04*  GLUCOSE 139* 116* 114*    Electrolytes  Recent Labs Lab 12/06/15 0329  12/09/15 0454 12/10/15 0505 12/11/15 0545 12/12/15 0515  CALCIUM 8.0*  < > 8.4* 7.5* 7.7* 7.3*  MG 2.2  --  1.9  --  2.2 2.2  PHOS 3.1  --   --   --  4.1 4.9*  < > = values in this interval not displayed.  CBC  Recent Labs Lab 12/11/15 0545 12/11/15 2100 12/12/15 0515  WBC 19.5* 19.1* 22.1*  HGB 8.0* 7.9* 8.2*  HCT 25.4* 24.8* 25.5*  PLT 278 300 334    Coag's  Recent Labs Lab 12/10/15 0505 12/11/15 0545 12/12/15 0515  INR 3.82* 3.61* 4.43*    Sepsis Markers  Recent Labs Lab 12/10/15 0930 12/11/15 0545 12/12/15 0515  PROCALCITON 7.82 5.22 3.57    ABG  Recent Labs Lab 12/09/15 0415 12/09/15 0610 12/11/15 2045  PHART 7.440 7.362 7.261*  PCO2ART 40.2 47.7* 46.2*  PO2ART 64.5* 460* 61.2*    Liver Enzymes  Recent Labs Lab 12/11/15 0545 12/12/15 0515  ALBUMIN 2.0* 2.0*    Cardiac Enzymes  Recent Labs Lab 12/09/15 1030  TROPONINI 0.03    Glucose  Recent Labs Lab 12/11/15 1124 12/11/15  1533 12/11/15 2015 12/11/15 2341 12/12/15 0343 12/12/15 0807  GLUCAP 132* 102* 104* 95 98 145*    Imaging Dg Abd Portable 1v  12/11/2015  CLINICAL DATA:  RIGHT hip fracture after a fall. Respiratory distress. Distended abdomen. EXAM: PORTABLE ABDOMEN - 1 VIEW COMPARISON:  04-Feb-2016. FINDINGS: Orogastric tube is in the stomach. No visible free air or bowel obstruction. Moderate stool noted in the hepatic flexure but not in the rectum.Less abdominal distention is noted than was present on 03/18. IMPRESSION: Unremarkable one view abdomen. Electronically Signed   By: Elsie StainJohn T Curnes M.D.   On: 12/11/2015 15:46    STUDIES:  CT chest  3/23 no mass, pna   MICROBIOLOGY: MRSA PCR 3/19:  Negative  ANTIBIOTICS: Clindamycin post procedure  SIGNIFICANT EVENTS: 3/19 - R Hip Repair  LINES/TUBES: OETT 7.5 3/24>>> L IJ CVL 3/24>>> OGT 3/24>>> Foley 3/24>>> PIV  ASSESSMENT / PLAN:  PULMONARY A: Acute Hypoxic Respiratory Failure H/O Severe COPD  P:   SBTs once off pressors Xopenex neb q6hr Change Solu-Medrol IV to q12hr O2 sat goal 88-92%   CARDIOVASCULAR A:  Hypotension - Resolved 3/24 but back on pressors 3/26? Secondary to sedation Paroxysmal Atrial Fibrillation - RVR resolved after intubation but back 3/26 H/O HTN  P:  Telemetry monitoring Phenylephrine for MAP goal > 65mmHg Cardiology following Continue coumadin per pharmacy for a-fib. Continue amiodarone Check  echo  RENAL A:   Acute on Chronic Renal Failure - Improving. H/O BPH  P:   NS MIVF @ 75cc/hr Trending UOP with foley Monitoring electrolytes & renal function daily Replace electrolytes as indicated Continue Flomax  GASTROINTESTINAL A:   Umbilical hernia  P:   Planned follow-up with [facility] as an outpatient NPO Start  tube feedings  HEMATOLOGIC A:   ABLA s/p surgery - Hgb Stable. Leukocytosis Coagulopathy - Chronic anticoagulation w/ Coumadin.  P:  Transfuse for Hgb <7.0 or active bleeding Coumadin per pharmacy INR daily  INFECTIOUS A:   No source of active infection  P:   Monitor for fever Procalcitonin slight high - empiric zosyn to cover HCAP  ENDOCRINE A:   Hypothyroidism - TSH low. Hyperglycemia - Likely secondary to steroids.  P:   Holding Synthroid for now -resume at somepoint Accu-checks q4hr Low dose SSI per algorithm  NEUROLOGIC A:   Pain Control - Post hip surgery 3/19. Sedation on Ventilator   P:   RASS goal: 0 to -1 BP could not tolerate propofol or precedex Fentanyl gtt & IV prn   FAMILY UPDATES: Daughter updated at bedside by Dr. Jamison NeighborNestor.   TODAY'S SUMMARY:  75 year old man with  severe COPD with acute on chronic respiratory failure following right hip surgery on 3/18,  3/24 developed resp distress again requiring intubation and ICU transfer. Requires intermittent pressors for unclear reason, doubt sepsis but will treat slight high pct   The patient is critically ill with multiple organ systems failure and requires high complexity decision making for assessment and support, frequent evaluation and titration of therapies, application of advanced monitoring technologies and extensive interpretation of multiple databases. Critical Care Time devoted to patient care services described in this note independent of APP time is 35 minutes.   Cyril Mourningakesh Alva MD. Tonny BollmanFCCP. [place] Pager [phone] If no response call [phone]   12/12/2015     12/12/2015 9:52 AM

## 2015-12-12 NOTE — Progress Notes (Signed)
Nutrition Follow-up  DOCUMENTATION CODES:   Non-severe (moderate) malnutrition in context of chronic illness  INTERVENTION:   Initiate Vital AF 1.2 at 25 ml/hr to increase by 10 ml every 4 hours to a goal rate of 55 ml/hr.  Tube feeding regimen will provide a total of 1584 kcals, 99 grams of protein, and 1071 ml of water.   NUTRITION DIAGNOSIS:   Malnutrition related to chronic illness as evidenced by mild depletion of muscle mass, mild depletion of body fat. Ongoing.   GOAL:   Patient will meet greater than or equal to 90% of their needs Progressing.   MONITOR:   Vent status, Labs, Weight trends, Skin, I & O's  REASON FOR ASSESSMENT:   Consult Enteral/tube feeding initiation and management  ASSESSMENT:   75 yo male with hx of Afib, recurrent falls, advanced COPD, hypothyroidism, BPH, HTN who was brought due to fall. He said he tripped while he was trying to move from his porch. He fell on his right hip and developed a bruise/pain at the site with no head trauma. He denied any dizziness, lightheadedness, chest pain, N/V/D/C/Abd pain. He has chronic dyspnea due to COPD with exertion.  3/18: right hip fracture repair 3/24: developed respiratory distress, transferred to ICU and intubated  Patient is currently intubated on ventilator support MV: 11.6 L/min Temp (24hrs), Avg:98 F (36.7 C), Min:97.3 F (36.3 C), Max:98.8 F (37.1 C)  Medications reviewed and include: senokot Labs reviewed: phosphorus elevated 4.9 CBG's: 95-145 OG tube  Pt discussed during ICU rounds and with RN.   Diet Order:  Diet NPO time specified  Skin:  Wound (see comment) (closed right hip incision)  Last BM:  3/27  Height:   Ht Readings from Last 1 Encounters:  12/05/15 5\' 5"  (1.651 m)    Weight:   Wt Readings from Last 1 Encounters:  12/08/15 130 lb 4.8 oz (59.104 kg)    Ideal Body Weight:  61.8 kg  BMI:  Body mass index is 21.68 kg/(m^2).  Estimated Nutritional Needs:    Kcal:  1551  Protein:  85-95 grams  Fluid:  >/= 1.5 L  EDUCATION NEEDS:   No education needs identified at this time  Kendell BaneHeather Ayde Record RD, LDN, CNSC [phone] Pager [phone] After Hours Pager

## 2015-12-12 NOTE — Progress Notes (Signed)
Pharmacy Antibiotic Note Stephen [name] Stephen [name] is a 75 y.o. male admitted on 2016-01-03. Pharmacy has been consulted for Cefepime dosing for empiric coverage of infection in setting of fever and prolonged admission.  Plan: 1.) Cefepime 1 gram IV q 24H based on present renal function  2.) Following along with you daily    Height: 5\' 5"  (165.1 cm) Weight: 130 lb 4.8 oz (59.104 kg) (Scale C) IBW/kg (Calculated) : 61.5  Temp (24hrs), Avg:97.9 F (36.6 C), Min:96.7 F (35.9 C), Max:98.8 F (37.1 C)   Recent Labs Lab 12/08/15 0640 12/09/15 0454 12/10/15 0505 12/11/15 0545 12/11/15 2100 12/12/15 0515  WBC 16.0* 15.3* 23.7* 19.5* 19.1* 22.1*  CREATININE 1.27* 1.74* 1.58* 1.53*  --  2.04*    Estimated Creatinine Clearance: 26.2 mL/min (by C-G formula based on Cr of 2.04).    Allergies  Allergen Reactions  . Penicillins Other (See Comments)    REACTION: feels drunk    Antimicrobials this admission: 3/27 Cefepime  >>    Dose adjustments this admission: n/a  Microbiology results: px  Thank you for allowing pharmacy to be a part of this patient'Stephen care.  Pollyann SamplesAndy Beya Tipps, PharmD, BCPS 12/12/2015, 10:47 AM Pager: [phone]

## 2015-12-12 NOTE — Progress Notes (Signed)
elink MD aware of continued decreased UOP, generalized edema, latest ABG & pxcr results. Orders received for RT to adjust ventilator settings, give HCO3 and repeat ABG at 6 pm. Will continue to monitor.

## 2015-12-13 ENCOUNTER — Inpatient Hospital Stay (HOSPITAL_COMMUNITY): Payer: PPO

## 2015-12-13 DIAGNOSIS — N179 Acute kidney failure, unspecified: Secondary | ICD-10-CM

## 2015-12-13 DIAGNOSIS — I4891 Unspecified atrial fibrillation: Secondary | ICD-10-CM

## 2015-12-13 DIAGNOSIS — J189 Pneumonia, unspecified organism: Secondary | ICD-10-CM

## 2015-12-13 LAB — BLOOD GAS, ARTERIAL
ACID-BASE DEFICIT: 1.8 mmol/L (ref 0.0–2.0)
Bicarbonate: 23.1 mEq/L (ref 20.0–24.0)
DRAWN BY: 398991
FIO2: 0.5
MECHVT: 490 mL
O2 SAT: 92.2 %
PATIENT TEMPERATURE: 99.7
PCO2 ART: 45.6 mmHg — AB (ref 35.0–45.0)
PEEP/CPAP: 10 cmH2O
PH ART: 7.329 — AB (ref 7.350–7.450)
PO2 ART: 73.3 mmHg — AB (ref 80.0–100.0)
RATE: 20 resp/min
TCO2: 24.5 mmol/L (ref 0–100)

## 2015-12-13 LAB — MAGNESIUM: Magnesium: 2.1 mg/dL (ref 1.7–2.4)

## 2015-12-13 LAB — BRAIN NATRIURETIC PEPTIDE: B Natriuretic Peptide: 311.7 pg/mL — ABNORMAL HIGH (ref 0.0–100.0)

## 2015-12-13 LAB — CBC WITH DIFFERENTIAL/PLATELET
BASOS PCT: 0 %
Basophils Absolute: 0 10*3/uL (ref 0.0–0.1)
Eosinophils Absolute: 0 10*3/uL (ref 0.0–0.7)
Eosinophils Relative: 0 %
HEMATOCRIT: 23.2 % — AB (ref 39.0–52.0)
HEMOGLOBIN: 7.5 g/dL — AB (ref 13.0–17.0)
LYMPHS ABS: 0.6 10*3/uL — AB (ref 0.7–4.0)
LYMPHS PCT: 4 %
MCH: 30 pg (ref 26.0–34.0)
MCHC: 32.3 g/dL (ref 30.0–36.0)
MCV: 92.8 fL (ref 78.0–100.0)
MONO ABS: 0.2 10*3/uL (ref 0.1–1.0)
MONOS PCT: 2 %
NEUTROS ABS: 15.4 10*3/uL — AB (ref 1.7–7.7)
NEUTROS PCT: 94 %
Platelets: 268 10*3/uL (ref 150–400)
RBC: 2.5 MIL/uL — ABNORMAL LOW (ref 4.22–5.81)
RDW: 16.8 % — AB (ref 11.5–15.5)
WBC: 16.3 10*3/uL — ABNORMAL HIGH (ref 4.0–10.5)

## 2015-12-13 LAB — COMPREHENSIVE METABOLIC PANEL
ALBUMIN: 1.7 g/dL — AB (ref 3.5–5.0)
ALK PHOS: 54 U/L (ref 38–126)
ALT: 51 U/L (ref 17–63)
ANION GAP: 9 (ref 5–15)
AST: 23 U/L (ref 15–41)
BILIRUBIN TOTAL: 0.9 mg/dL (ref 0.3–1.2)
BUN: 68 mg/dL — AB (ref 6–20)
CALCIUM: 7.2 mg/dL — AB (ref 8.9–10.3)
CO2: 22 mmol/L (ref 22–32)
Chloride: 116 mmol/L — ABNORMAL HIGH (ref 101–111)
Creatinine, Ser: 2.87 mg/dL — ABNORMAL HIGH (ref 0.61–1.24)
GFR calc Af Amer: 23 mL/min — ABNORMAL LOW (ref >60)
GFR, EST NON AFRICAN AMERICAN: 20 mL/min — AB (ref >60)
GLUCOSE: 138 mg/dL — AB (ref 65–99)
POTASSIUM: 3.9 mmol/L (ref 3.5–5.1)
Sodium: 147 mmol/L — ABNORMAL HIGH (ref 135–145)
TOTAL PROTEIN: 3.6 g/dL — AB (ref 6.5–8.1)

## 2015-12-13 LAB — GLUCOSE, CAPILLARY
GLUCOSE-CAPILLARY: 119 mg/dL — AB (ref 65–99)
GLUCOSE-CAPILLARY: 119 mg/dL — AB (ref 65–99)
GLUCOSE-CAPILLARY: 144 mg/dL — AB (ref 65–99)
Glucose-Capillary: 137 mg/dL — ABNORMAL HIGH (ref 65–99)
Glucose-Capillary: 130 mg/dL — ABNORMAL HIGH (ref 65–99)
Glucose-Capillary: 124 mg/dL — ABNORMAL HIGH (ref 65–99)

## 2015-12-13 LAB — ECHOCARDIOGRAM COMPLETE
HEIGHTINCHES: 65 in
WEIGHTICAEL: 2426.82 [oz_av]

## 2015-12-13 LAB — TROPONIN I
Troponin I: 0.04 ng/mL — ABNORMAL HIGH (ref <0.031)
Troponin I: 0.04 ng/mL — ABNORMAL HIGH (ref <0.031)

## 2015-12-13 LAB — PROTIME-INR
INR: 6.52 (ref 0.00–1.49)
Prothrombin Time: 54.9 seconds — ABNORMAL HIGH (ref 11.6–15.2)

## 2015-12-13 LAB — PHOSPHORUS: Phosphorus: 5 mg/dL — ABNORMAL HIGH (ref 2.5–4.6)

## 2015-12-13 MED ORDER — VANCOMYCIN HCL IN DEXTROSE 1-5 GM/200ML-% IV SOLN
1000.0000 mg | INTRAVENOUS | Status: DC
  Administered 2015-12-15: 1000 mg via INTRAVENOUS
  Filled 2015-12-13: qty 200

## 2015-12-13 MED ORDER — MIDAZOLAM HCL 2 MG/2ML IJ SOLN
1.0000 mg | INTRAMUSCULAR | Status: DC | PRN
  Administered 2015-12-13 – 2015-12-14 (×4): 1 mg via INTRAVENOUS
  Filled 2015-12-13 (×3): qty 2

## 2015-12-13 MED ORDER — MIDAZOLAM HCL 2 MG/2ML IJ SOLN
1.0000 mg | INTRAMUSCULAR | Status: DC | PRN
  Administered 2015-12-14 – 2015-12-16 (×6): 1 mg via INTRAVENOUS
  Filled 2015-12-13 (×8): qty 2

## 2015-12-13 MED ORDER — SODIUM CHLORIDE 0.9 % IV SOLN
1250.0000 mg | Freq: Once | INTRAVENOUS | Status: AC
  Administered 2015-12-13: 1250 mg via INTRAVENOUS
  Filled 2015-12-13: qty 1250

## 2015-12-13 MED ORDER — DEXTROSE 5 % IV SOLN
5.0000 mg | Freq: Once | INTRAVENOUS | Status: AC
  Administered 2015-12-13: 5 mg via INTRAVENOUS
  Filled 2015-12-13: qty 0.5

## 2015-12-13 MED ORDER — AMIODARONE LOAD VIA INFUSION
150.0000 mg | Freq: Once | INTRAVENOUS | Status: AC
  Administered 2015-12-13: 150 mg via INTRAVENOUS
  Filled 2015-12-13: qty 83.34

## 2015-12-13 MED ORDER — AMIODARONE HCL IN DEXTROSE 360-4.14 MG/200ML-% IV SOLN
30.0000 mg/h | INTRAVENOUS | Status: DC
  Administered 2015-12-13 – 2015-12-17 (×11): 30 mg/h via INTRAVENOUS
  Filled 2015-12-13 (×11): qty 200

## 2015-12-13 MED ORDER — FUROSEMIDE 10 MG/ML IJ SOLN
80.0000 mg | Freq: Once | INTRAMUSCULAR | Status: AC
  Administered 2015-12-13: 80 mg via INTRAVENOUS
  Filled 2015-12-13: qty 8

## 2015-12-13 MED ORDER — DILTIAZEM HCL 100 MG IV SOLR
5.0000 mg/h | INTRAVENOUS | Status: DC
  Administered 2015-12-13: 15 mg/h via INTRAVENOUS
  Administered 2015-12-13: 5 mg/h via INTRAVENOUS
  Administered 2015-12-14: 8 mg/h via INTRAVENOUS
  Filled 2015-12-13 (×3): qty 100

## 2015-12-13 MED ORDER — AMIODARONE HCL IN DEXTROSE 360-4.14 MG/200ML-% IV SOLN
60.0000 mg/h | INTRAVENOUS | Status: AC
  Administered 2015-12-13 (×2): 60 mg/h via INTRAVENOUS
  Filled 2015-12-13 (×2): qty 200

## 2015-12-13 NOTE — Progress Notes (Signed)
Sputum sample obtained and sent down to main lab without any complications.  

## 2015-12-13 NOTE — Clinical Social Work Note (Signed)
Clinical Social Worker continuing to follow patient and family for support and discharge planning needs.  Patient had a [facility] available [facility] prior to decompensation and need for ventilator support.  CSW remains available for support, however patient remains intubated at this time and not medically appropriate for discharge to SNF.  CSW to facilitate patient discharge needs once medically stable.  Macario GoldsJesse Khaleef Ruby, Kentucky[place]CSW [phone]

## 2015-12-13 NOTE — Progress Notes (Signed)
PULMONARY / CRITICAL CARE MEDICINE   Name: Stephen [name] MRN: [id number] DOB: November 27, 1940    ADMISSION DATE:  11/26/2015 CONSULTATION DATE:  12/02/2015  REFERRING MD:  Dr August Saucerean, Ortho  CHIEF COMPLAINT:  Respiratory Distress  HISTORY OF PRESENT ILLNESS:   75 year old man with severe COPD, prednisone and oxygen dependent, atrial fibrillation on Coumadin, hypertension. He was admitted on 11/20/2015 with a right hip fracture after mechanical fall.  He went to the operating room on 3/19 for hip repair. In the PACU he was extubated with some difficulty but then continued to have significant increased work of breathing. PCCM asked to evaluate. Was on BiPAP temporarily and responded well. 3/24 early AM while on floor, developed resp distress again and would not tolerate BiPAP. Moved to ICU for intubation.   SUBJECTIVE:  Afebrile Off pressors, BP very sensitive to versed Secretions ++ UO poor with lasix Intermittent jerks continue  REVIEW OF SYSTEMS: Unable to obtain given intubation and sedation.  VITAL SIGNS: BP 90/49 mmHg  Pulse 126  Temp(Src) 99.7 F (37.6 C) (Axillary)  Resp 20  Ht 5\' 5"  (1.651 m)  Wt 151 lb 10.8 oz (68.8 kg)  BMI 25.24 kg/m2  SpO2 96%  HEMODYNAMICS: CVP:  [12 mmHg-19 mmHg] 16 mmHg  VENTILATOR SETTINGS: Vent Mode:  [-] PRVC FiO2 (%):  [50 %-80 %] 50 % Set Rate:  [16 bmp-20 bmp] 20 bmp Vt Set:  [490 mL] 490 mL PEEP:  [5 cmH20-10 cmH20] 10 cmH20 Plateau Pressure:  [22 cmH20-27 cmH20] 22 cmH20  INTAKE / OUTPUT: I/O last 3 completed shifts: In: 6786.9 [I.V.:4126.9; NG/GT:610; IV Piggyback:2050] Out: 1040 [Urine:1040]  PHYSICAL EXAMINATION: General:  Sedated. Chr ill appearing Integument:  Warm & dry. No rash on exposed skin.  HEENT:  No scleral injection or icterus. Endotracheal tube in place.  Cardiovascular:  Regular rate. No edema. No appreciable JVD.  Pulmonary:  Clear to auscultation bilaterally. Symmetric chest wall rise on ventilator. Abdomen: Soft.  Hypoactive bowel sounds. Mild abdominal distention. Neurological: RASS-2 on fent gtt, Not following commands. Pupils equal. Grimaces to pain.9  LABS:  BMET  Recent Labs Lab 12/11/15 0545 12/12/15 0515 12/13/15 0545  NA 141 143 147*  K 4.3 4.6 3.9  CL 111 115* 116*  CO2 21* 20* 22  BUN 43* 54* 68*  CREATININE 1.53* 2.04* 2.87*  GLUCOSE 116* 114* 138*    Electrolytes  Recent Labs Lab 12/11/15 0545 12/12/15 0515 12/13/15 0545  CALCIUM 7.7* 7.3* 7.2*  MG 2.2 2.2 2.1  PHOS 4.1 4.9* 5.0*    CBC  Recent Labs Lab 12/11/15 2100 12/12/15 0515 12/13/15 0545  WBC 19.1* 22.1* 16.3*  HGB 7.9* 8.2* 7.5*  HCT 24.8* 25.5* 23.2*  PLT 300 334 268    Coag's  Recent Labs Lab 12/11/15 0545 12/12/15 0515 12/13/15 0545  INR 3.61* 4.43* 6.52*    Sepsis Markers  Recent Labs Lab 12/10/15 0930 12/11/15 0545 12/12/15 0515  PROCALCITON 7.82 5.22 3.57    ABG  Recent Labs Lab 12/12/15 1115 12/12/15 1440 12/12/15 1819  PHART 7.176* 7.236* 7.306*  PCO2ART 50.6* 44.6 42.8  PO2ART 68.3* 93.9 74.6*    Liver Enzymes  Recent Labs Lab 12/11/15 0545 12/12/15 0515 12/13/15 0545  AST  --   --  23  ALT  --   --  51  ALKPHOS  --   --  54  BILITOT  --   --  0.9  ALBUMIN 2.0* 2.0* 1.7*    Cardiac Enzymes  Recent Labs  Lab 12/09/15 1030  TROPONINI 0.03    Glucose  Recent Labs Lab 12/12/15 0807 12/12/15 1158 12/12/15 1559 12/12/15 1957 12/12/15 2316 12/13/15 0320  GLUCAP 145* 95 75 72 84 119*    Imaging Dg Chest Port 1 View  12/13/2015  CLINICAL DATA:  Respiratory failure EXAM: PORTABLE CHEST 1 VIEW COMPARISON:  12/12/2015 FINDINGS: Cardiac shadow is stable. Increasing bibasilar infiltrates are seen particularly on the left. A nasogastric catheter, endotracheal tube and left jugular central line are again seen and stable. No bony abnormality is noted. IMPRESSION: Increasing infiltrates in the bases bilaterally. Tubes and lines as described.  Electronically Signed   By: Alcide Clever M.D.   On: 12/13/2015 07:36   Dg Chest Port 1 View  12/12/2015  CLINICAL DATA:  ACUTE RESPIRATORY FAILURE EXAM: PORTABLE CHEST - 1 VIEW COMPARISON:  12/10/2015 FINDINGS: Endotracheal tube, nasogastric tube, and left IJ central line are stable in position. Progressive patchy airspace opacities in both lung bases. Some increase in perihilar interstitial edema or infiltrates. Heart size upper limits normal. No pneumothorax. No effusion. Atheromatous aorta. Visualized skeletal structures are unremarkable. IMPRESSION: 1. Worsening bibasilar interstitial and airspace infiltrates or edema. 2.  Support hardware stable in position. Electronically Signed   By: Corlis Leak M.D.   On: 12/12/2015 13:25    STUDIES:  CT chest 3/23 no mass, pna   MICROBIOLOGY: MRSA PCR 3/19:  Negative  ANTIBIOTICS: Clindamycin post procedure 3/27 cefepime >> 3/28 vanc >>  SIGNIFICANT EVENTS: 3/19 - R Hip Repair 3/27 increased FIO2/PEEP, bicarb gtt for acidosis  LINES/TUBES: OETT 7.5 3/24>>> L IJ CVL 3/24>>> OGT 3/24>>> Foley 3/24>>> PIV  ASSESSMENT / PLAN:  PULMONARY A: Acute Hypoxic Respiratory Failure H/O Severe COPD ARDS 3/27 ? New HCAP P:   Xopenex neb q6hr Change Solu-Medrol IV to q12hr O2 sat goal 88-92%   CARDIOVASCULAR A:  Hypotension - Resolved 3/24 but back on pressors 3/26? Secondary to sedation Paroxysmal Atrial Fibrillation - RVR resolved after intubation but back 3/26 H/O HTN  P:  Phenylephrine for MAP goal > Cardiology following Changed PO to amiodarone gtt Await echo  RENAL A:   Acute on Chronic Renal Failure  H/O BPH Metab acidosis P:   Dc IVFs Trending UOP with foley Monitoring electrolytes & renal function daily Replace electrolytes as indicated Continue Flomax Bicarb gtt  GASTROINTESTINAL A:   Umbilical hernia  P:   Planned follow-up with [facility] as an outpatient NPO Tube feedings @ goal  HEMATOLOGIC A:   ABLA  s/p surgery - Hgb Stable. Leukocytosis Coagulopathy - Chronic anticoagulation w/ Coumadin.  P:  Transfuse for Hgb <7.0 or active bleeding Coumadin per pharmacy on hold INR daily - give  vit K  INFECTIOUS A:   HCAP 3/27  P:   Monitor for fever Procalcitonin slight high - empiric cefepime/ vanc to cover HCAP  ENDOCRINE A:   Hypothyroidism - TSH low. Hyperglycemia - Likely secondary to steroids.  P:   Holding Synthroid for now -resume at somepoint Accu-checks q4hr Low dose SSI per algorithm  NEUROLOGIC A:   Pain Control - Post hip surgery 3/19. Sedation on Ventilator   P:   RASS goal: 0 to -1 BP could not tolerate propofol or precedex Fentanyl gtt & IV prn   FAMILY UPDATES: Daughter updated 3/26   TODAY'S SUMMARY:  75 year old man with severe COPD with acute on chronic respiratory failure following right hip surgery on 3/18,  3/24 developed resp distress again requiring intubation and ICU transfer. Requires  intermittent pressors with sedatives, doubt sepsis but will treat for HCAP slight high pct   The patient is critically ill with multiple organ systems failure and requires high complexity decision making for assessment and support, frequent evaluation and titration of therapies, application of advanced monitoring technologies and extensive interpretation of multiple databases. Critical Care Time devoted to patient care services described in this note independent of APP time is 35 minutes.   Cyril Mourning MD. Tonny Bollman. [facility] Pager [phone] If no response call [phone]   12/13/2015     12/13/2015 8:10 AM

## 2015-12-13 NOTE — Progress Notes (Signed)
Patient's gold wedding band given to patient's daughter, Stephen [name].

## 2015-12-13 NOTE — Progress Notes (Signed)
  Echocardiogram 2D Echocardiogram has been performed.  Nolon RodBrown, Tony 12/13/2015, 3:12 PM

## 2015-12-13 NOTE — Progress Notes (Addendum)
Genitalia remains swollen. Unable to retract foreskin to view head of penis. MD aware. Foley in place draining well. Foley care completed. Will continue to monitor.    13:00 Update: Renae FicklePaul NP came by to assess pt's genitalia. Will continue to monitor. No orders received at this time.

## 2015-12-13 NOTE — Progress Notes (Signed)
ABG was ordered for patient.  Following results were obtained.  Results given to Dr. Vassie LollAlva.  No changes made at this time.  Will continue to monitor.    Ref. Range 12/13/2015 09:15  Sample type Unknown ARTERIAL DRAW  Delivery systems Unknown VENTILATOR  FIO2 Unknown 0.50  Mode Unknown PRESSURE REGULATE...  VT Latest Units: mL 490  Peep/cpap Latest Units: cm H20 10.0  pH, Arterial Latest Ref Range: 7.350-7.450  7.329 (L)  pCO2 arterial Latest Ref Range: 35.0-45.0 mmHg 45.6 (H)  pO2, Arterial Latest Ref Range: 80.0-100.0 mmHg 73.3 (L)  Bicarbonate Latest Ref Range: 20.0-24.0 mEq/L 23.1  TCO2 Latest Ref Range: 0-100 mmol/L 24.5  Acid-base deficit Latest Ref Range: 0.0-2.0 mmol/L 1.8  O2 Saturation Latest Units: % 92.2  Patient temperature Unknown 99.7  Collection site Unknown A-LINE

## 2015-12-13 NOTE — Progress Notes (Signed)
Events noted

## 2015-12-13 NOTE — Progress Notes (Signed)
Noted pt HR to increase and sustained Afib 130-140bpm. MD called, notified. Also informed MD of pt's lung sounds, CVP readings, low UO. Discussed pt case, vitals, and meds. Orders received. Will continue to monitor closely.

## 2015-12-13 NOTE — Progress Notes (Signed)
RN called to titrate IV dilt.  Order written also with edema and BNP 311 and +1900 I gave pt a one time dose of 80 mg lasix.

## 2015-12-13 NOTE — Consult Note (Signed)
   [facility]   12/13/2015  [name] December 30, 1940 [id number] Patient screened for [facility] Care Management services for post hospital follow up. [facility] is [facility] with his [employer] Medicare plan.  However, Epic reveals patient's remains on the ventilator. [facility] services not appropriate at this time.  For questions please contact:   Charlesetta ShanksVictoria Helayne Metsker, RN BSN CCM [facility]ia[facility]  [phone] business mobile phone Toll free office [phone]

## 2015-12-13 NOTE — Progress Notes (Signed)
Spoke with Cards NP Annie ParasIngold regarding results of BNP, Troponin, lung crackles on auscultation. Also requested clarification on titrations of Cardizem as HR has decreased to 110s but is now Afib 120s-130s. Orders received. Will continue to monitor.

## 2015-12-13 NOTE — Progress Notes (Signed)
CRITICAL VALUE ALERT  Critical value received:  PT 54.9 & INR 6.52  Date of notification:  12/13/2015  Time of notification: 07:38  Critical value read back:Yes.    Nurse who received alert:  Lennie MuckleBethany Nyra Anspaugh RN  MD notified (1st page):  Dr. Vassie LollAlva   Time of first page:  07:55   Responding MD:  Dr. Vassie LollAlva   Time MD responded:  07:55 face to face   Orders received for Vitamin K

## 2015-12-13 NOTE — Progress Notes (Signed)
Pharmacy Antibiotic Note Stephen [name] is a 75 y.o. male admitted on 11/20/2015. Pharmacy has been consulted for Cefepime dosing for empiric coverage of infection in setting of fever and prolonged admission. Now to add vancomycin as well for possible PNA. Afebrile, WBC down to 16.3. SCr up to 2.87, CrCl ~7620ml/min. UOP ok at 0.625ml/kg/hr.   Plan: Continue cefepime 1g IV Q24 Give vancomycin 1,250mg  IV x 1, then start vancomycin 1g IV Q48 Monitor clinical picture, renal function closely, VT at Css F/U C&S, abx deescalation / LOT   Height: 5\' 5"  (165.1 cm) Weight: 151 lb 10.8 oz (68.8 kg) IBW/kg (Calculated) : 61.5  Temp (24hrs), Avg:98.6 F (37 C), Min:97.3 F (36.3 C), Max:100.3 F (37.9 C)   Recent Labs Lab 12/09/15 0454 12/10/15 0505 12/11/15 0545 12/11/15 2100 12/12/15 0515 12/13/15 0545  WBC 15.3* 23.7* 19.5* 19.1* 22.1* 16.3*  CREATININE 1.74* 1.58* 1.53*  --  2.04* 2.87*    Estimated Creatinine Clearance: 19.3 mL/min (by C-G formula based on Cr of 2.87).    Allergies  Allergen Reactions  . Penicillins Other (See Comments)    REACTION: feels drunk    Antimicrobials this admission: 3/27 Cefepime  >>  3/28 Vancomycin >>  Dose adjustments this admission: n/a  Microbiology results: MRSA PCR negative  Thank you for allowing pharmacy to be a part of this patient's care.  Enzo BiNathan Claiborne Stroble, PharmD, BCPS Clinical Pharmacist Pager [phone] 12/13/2015 8:18 AM

## 2015-12-13 NOTE — Progress Notes (Signed)
eLink Physician-Brief Progress Note Patient Name: Stephen [name] DOB: 1941/04/11 MRN: [id number]   Date of Service  12/13/2015  HPI/Events of Note  A-fib RVR and fluid overload.  eICU Interventions  Amio drip and lasix.     Intervention Category Major Interventions: Other:  [name],[name] 12/13/2015, 12:29 AM

## 2015-12-13 NOTE — Progress Notes (Signed)
       Patient Name: Stephen [name] Date of Encounter: 12/13/2015    SUBJECTIVE:Recurrent AF with RVR and CXR worse this AM.  TELEMETRY:  Rapid irregularly irregular rhythm either AF or MAT Filed Vitals:   12/13/15 0900 12/13/15 1000 12/13/15 1100 12/13/15 1119  BP: 123/68 108/62 107/91 131/64  Pulse: 139 118 130 125  Temp:      TempSrc:      Resp: 27 9 20 23   Height:      Weight:      SpO2: 92% 95% 96% 94%    Intake/Output Summary (Last 24 hours) at 12/13/15 1159 Last data filed at 12/13/15 1000  Gross per 24 hour  Intake 3828.66 ml  Output    990 ml  Net 2838.66 ml   LABS: Basic Metabolic Panel:  Recent Labs  16/06/9602/27/17 0515 12/13/15 0545  NA 143 147*  K 4.6 3.9  CL 115* 116*  CO2 20* 22  GLUCOSE 114* 138*  BUN 54* 68*  CREATININE 2.04* 2.87*  CALCIUM 7.3* 7.2*  MG 2.2 2.1  PHOS 4.9* 5.0*   CBC:  Recent Labs  12/12/15 0515 12/13/15 0545  WBC 22.1* 16.3*  NEUTROABS 20.7* 15.4*  HGB 8.2* 7.5*  HCT 25.5* 23.2*  MCV 93.8 92.8  PLT 334 268   BNP No results found for: BNP  ProBNP    Component Value Date/Time   PROBNP <30.0 09/23/2010 1148    ECG: atrial fib with RVR at 126 bpm  Radiology/Studies:  CXR with increasing bilateral infiltrates  Physical Exam: Blood pressure 131/64, pulse 125, temperature 99.7 F (37.6 C), temperature source Axillary, resp. rate 23, height 5\' 5"  (1.651 m), weight 151 lb 10.8 oz (68.8 kg), SpO2 94 %. Weight change:   Wt Readings from Last 3 Encounters:  12/13/15 151 lb 10.8 oz (68.8 kg)  07/19/15 125 lb (56.7 kg)  01/27/15 111 lb (50.349 kg)   Clear lungs anterior. Rapid irregularly irregular rhythm. No edema Intubated and sedated.  ASSESSMENT:  1. Tachycardia c/w AF but need to consider MAT 2. A/C kidney injury 3. COPD with respiratory failure, worse overnight.  Plan:  1. Agree with the IV amiodarone 2. ECG to exclude MAT 3. IV diltiazem for better rate control is BP allows. 4. 2 D  echocardiogram 5. Cycle cardiac markers.  Selinda EonSigned, SMITH III,HENRY W 12/13/2015, 11:59 AM

## 2015-12-13 NOTE — Progress Notes (Signed)
ANTICOAGULATION CONSULT NOTE - Follow Up Consult  Pharmacy Consult for warfarin Indication: atrial fibrillation  Allergies  Allergen Reactions  . Penicillins Other (See Comments)    REACTION: feels drunk    Patient Measurements: Height: 5\' 5"  (165.1 cm) Weight: 151 lb 10.8 oz (68.8 kg) IBW/kg (Calculated) : 61.5  Vital Signs: Temp: 97.5 F (36.4 C) (03/28 0400) Temp Source: Axillary (03/28 0400) BP: 90/49 mmHg (03/28 0700) Pulse Rate: 126 (03/28 0700)  Labs:  Recent Labs  12/11/15 0545 12/11/15 2100 12/12/15 0515 12/13/15 0545  HGB 8.0* 7.9* 8.2* 7.5*  HCT 25.4* 24.8* 25.5* 23.2*  PLT 278 300 334 268  LABPROT 35.2*  --  41.0*  --   INR 3.61*  --  4.43*  --   CREATININE 1.53*  --  2.04*  --     Estimated Creatinine Clearance: 27.2 mL/min (by C-G formula based on Cr of 2.04).  Assessment: [age] YOM s/p R femoral nailing for subtrochanteric fracture (OR on 3/18) On Coumadin 2.5mg  daily exc 5mg  on Mon and Fri and 3.75mg  on Wed PTA for AFib.  Vitamin K 1mg  IV x1 on 3/18 (s/p R hip fx repair on 3/19). Coumadin then restarted on 3/19 and the INR trended up to supratherapeutic so has been on hold since 3/24. Today's INR continues to trend up to 6.52. Hgb low at 7.5, plts wnl. No documented s/s of bleed. High INR likely due to amio interaction.   Goal of Therapy:  INR 2-3   Plan:  Continue to hold coumadin Monitor daily INR, CBC, s/s of bleed  Consider need to give Vit K if have any worry about bleed  Enzo BiNathan Kayleigh Broadwell, PharmD, BCPS Clinical Pharmacist Pager [phone] 12/13/2015 7:33 AM

## 2015-12-14 ENCOUNTER — Inpatient Hospital Stay (HOSPITAL_COMMUNITY): Payer: PPO

## 2015-12-14 DIAGNOSIS — J810 Acute pulmonary edema: Secondary | ICD-10-CM

## 2015-12-14 DIAGNOSIS — I5031 Acute diastolic (congestive) heart failure: Secondary | ICD-10-CM

## 2015-12-14 LAB — CBC WITH DIFFERENTIAL/PLATELET
Basophils Absolute: 0 10*3/uL (ref 0.0–0.1)
Basophils Relative: 0 %
Eosinophils Absolute: 0 10*3/uL (ref 0.0–0.7)
Eosinophils Relative: 0 %
HEMATOCRIT: 22.4 % — AB (ref 39.0–52.0)
HEMOGLOBIN: 7.4 g/dL — AB (ref 13.0–17.0)
LYMPHS ABS: 0.3 10*3/uL — AB (ref 0.7–4.0)
Lymphocytes Relative: 2 %
MCH: 30 pg (ref 26.0–34.0)
MCHC: 33 g/dL (ref 30.0–36.0)
MCV: 90.7 fL (ref 78.0–100.0)
MONOS PCT: 2 %
Monocytes Absolute: 0.5 10*3/uL (ref 0.1–1.0)
NEUTROS ABS: 19 10*3/uL — AB (ref 1.7–7.7)
NEUTROS PCT: 96 %
Platelets: 245 10*3/uL (ref 150–400)
RBC: 2.47 MIL/uL — ABNORMAL LOW (ref 4.22–5.81)
RDW: 16.4 % — ABNORMAL HIGH (ref 11.5–15.5)
WBC: 19.9 10*3/uL — ABNORMAL HIGH (ref 4.0–10.5)

## 2015-12-14 LAB — GLUCOSE, CAPILLARY
GLUCOSE-CAPILLARY: 149 mg/dL — AB (ref 65–99)
GLUCOSE-CAPILLARY: 155 mg/dL — AB (ref 65–99)
Glucose-Capillary: 119 mg/dL — ABNORMAL HIGH (ref 65–99)
Glucose-Capillary: 118 mg/dL — ABNORMAL HIGH (ref 65–99)
Glucose-Capillary: 148 mg/dL — ABNORMAL HIGH (ref 65–99)
Glucose-Capillary: 112 mg/dL — ABNORMAL HIGH (ref 65–99)

## 2015-12-14 LAB — BLOOD GAS, ARTERIAL
ACID-BASE EXCESS: 0.2 mmol/L (ref 0.0–2.0)
Bicarbonate: 25.3 mEq/L — ABNORMAL HIGH (ref 20.0–24.0)
Drawn by: 398991
FIO2: 0.5
MODE: POSITIVE
O2 Saturation: 87.6 %
PATIENT TEMPERATURE: 98.1
PCO2 ART: 47.4 mmHg — AB (ref 35.0–45.0)
PEEP: 5 cmH2O
PH ART: 7.345 — AB (ref 7.350–7.450)
PRESSURE SUPPORT: 5 cmH2O
TCO2: 26.8 mmol/L (ref 0–100)
pO2, Arterial: 59.7 mmHg — ABNORMAL LOW (ref 80.0–100.0)

## 2015-12-14 LAB — PROTIME-INR
INR: 1.57 — ABNORMAL HIGH (ref 0.00–1.49)
Prothrombin Time: 18.8 seconds — ABNORMAL HIGH (ref 11.6–15.2)

## 2015-12-14 LAB — RENAL FUNCTION PANEL
Albumin: 1.7 g/dL — ABNORMAL LOW (ref 3.5–5.0)
Anion gap: 11 (ref 5–15)
BUN: 80 mg/dL — ABNORMAL HIGH (ref 6–20)
CO2: 24 mmol/L (ref 22–32)
Calcium: 7.1 mg/dL — ABNORMAL LOW (ref 8.9–10.3)
Chloride: 112 mmol/L — ABNORMAL HIGH (ref 101–111)
Creatinine, Ser: 3.19 mg/dL — ABNORMAL HIGH (ref 0.61–1.24)
GFR calc Af Amer: 20 mL/min — ABNORMAL LOW (ref >60)
GFR calc non Af Amer: 18 mL/min — ABNORMAL LOW (ref >60)
Glucose, Bld: 145 mg/dL — ABNORMAL HIGH (ref 65–99)
Phosphorus: 5 mg/dL — ABNORMAL HIGH (ref 2.5–4.6)
Potassium: 3.3 mmol/L — ABNORMAL LOW (ref 3.5–5.1)
Sodium: 147 mmol/L — ABNORMAL HIGH (ref 135–145)

## 2015-12-14 LAB — MAGNESIUM: Magnesium: 2 mg/dL (ref 1.7–2.4)

## 2015-12-14 LAB — TROPONIN I: Troponin I: 0.04 ng/mL — ABNORMAL HIGH (ref <0.031)

## 2015-12-14 MED ORDER — FUROSEMIDE 10 MG/ML IJ SOLN
40.0000 mg | Freq: Four times a day (QID) | INTRAMUSCULAR | Status: AC
  Administered 2015-12-14 (×3): 40 mg via INTRAVENOUS
  Filled 2015-12-14 (×3): qty 4

## 2015-12-14 MED ORDER — WARFARIN SODIUM 1 MG PO TABS
1.0000 mg | ORAL_TABLET | Freq: Once | ORAL | Status: DC
  Filled 2015-12-14: qty 1

## 2015-12-14 MED ORDER — POTASSIUM CHLORIDE 20 MEQ/15ML (10%) PO SOLN
40.0000 meq | Freq: Once | ORAL | Status: AC
  Administered 2015-12-14: 40 meq
  Filled 2015-12-14: qty 30

## 2015-12-14 NOTE — Progress Notes (Signed)
ANTICOAGULATION CONSULT NOTE - Follow Up Consult  Pharmacy Consult for warfarin Indication: atrial fibrillation  Allergies  Allergen Reactions  . Penicillins Other (See Comments)    REACTION: feels drunk    Patient Measurements: Height: 5\' 5"  (165.1 cm) Weight: 151 lb 10.8 oz (68.8 kg) IBW/kg (Calculated) : 61.5  Vital Signs: Temp: 98.8 F (37.1 C) (03/29 0400) Temp Source: Axillary (03/29 0400) BP: 89/69 mmHg (03/29 0700) Pulse Rate: 39 (03/29 0700)  Labs:  Recent Labs  12/12/15 0515 12/13/15 0545 12/13/15 1248 12/13/15 1840 12/14/15 0230  HGB 8.2* 7.5*  --   --  7.4*  HCT 25.5* 23.2*  --   --  22.4*  PLT 334 268  --   --  245  LABPROT 41.0* 54.9*  --   --  18.8*  INR 4.43* 6.52*  --   --  1.57*  CREATININE 2.04* 2.87*  --   --  3.19*  TROPONINI  --   --  0.04* 0.04* 0.04*    Estimated Creatinine Clearance: 17.4 mL/min (by C-G formula based on Cr of 3.19).  Assessment: [age] YOM s/p R femoral nailing for subtrochanteric fracture (OR on 3/18) On Warfarin 2.5mg  daily exc 5mg  on Mon and Fri and 3.75mg  on Wed PTA for AFib. Vitamin K 1mg  IV x1 on 3/18 (s/p R hip fx repair on 3/19). Warfarin then restarted on 3/19 and INR trended up to supratherapeutic so has been on hold since 3/24. Today's INR is 1.57 after vitk 5 mg IV received, CBC stable with no sxs of bleeding. Remains on amio gtt for afib  Goal of Therapy:  INR 2-3   Plan:  1. Resume warfarin at conservative dose of 1 mg x 1 tonight 2. Monitor daily INR, CBC, s/s of bleed   Pollyann SamplesAndy Kyron Schlitt, PharmD, BCPS 12/14/2015, 7:34 AM Pager: [phone]        /

## 2015-12-14 NOTE — Progress Notes (Addendum)
Patient Name: Stephen [name] Date of Encounter: 12/14/2015  Active Problems:   Paroxysmal atrial fibrillation ([place])   Long term current use of anticoagulant therapy   Hip fracture (HCC)   Fracture   Acute on chronic respiratory failure (HCC)   Postoperative anemia due to acute blood loss   Acute respiratory failure with hypoxemia [facility])   Primary Cardiologist: Dr Stanford Breed Patient Profile: 75 y.o. male with a history of PAF on amio and coumadin, severe COPD on home o2, hypothyroidism and HTN who was admitted to [facility] on 11/25/2015 for hip fracture after a mechanical fall. He underwent ORIF on 12/08/2015. Re-intubated on 03/24. Cards consult 03/22 for afib.  SUBJECTIVE: Sedated on the vent  OBJECTIVE Filed Vitals:   12/14/15 0700 12/14/15 0729 12/14/15 0734 12/14/15 0800  BP: 89/69  114/91 63/37  Pulse: 39  104 106  Temp:    98.1 F (36.7 C)  TempSrc:    Axillary  Resp: 20   24  Height:      Weight:      SpO2: 88% 93% 92% 92%    Intake/Output Summary (Last 24 hours) at 12/14/15 1104 Last data filed at 12/14/15 1000  Gross per 24 hour  Intake 3327.03 ml  Output   1500 ml  Net 1827.03 ml   Filed Weights   12/07/15 0243 12/08/15 0419 12/13/15 0218  Weight: 128 lb 12.8 oz (58.423 kg) 130 lb 4.8 oz (59.104 kg) 151 lb 10.8 oz (68.8 kg)    PHYSICAL EXAM General: Well developed, well nourished, male sedated on the vent. Head: Normocephalic, atraumatic.  Neck: Supple without bruits, JVD not elevated but difficult to assess 2nd lines and equipment. Lungs:  Resp regular and unlabored, basilar rales. Heart: slightly irregular R&R, S1, S2, no S3, S4, or murmur; no rub. Abdomen: Soft, non-tender, non-distended, BS + x 4.  Extremities: No clubbing, cyanosis, 1+ LE edema.   LABS: CBC: Recent Labs  12/13/15 0545 12/14/15 0230  WBC 16.3* 19.9*  NEUTROABS 15.4* 19.0*  HGB 7.5* 7.4*  HCT 23.2* 22.4*  MCV 92.8 90.7  PLT 268 245   INR: Recent Labs  12/14/15 0230  INR  4.97*   Basic Metabolic Panel: Recent Labs  12/13/15 0545 12/14/15 0230  NA 147* 147*  K 3.9 3.3*  CL 116* 112*  CO2 22 24  GLUCOSE 138* 145*  BUN 68* 80*  CREATININE 2.87* 3.19*  CALCIUM 7.2* 7.1*  MG 2.1 2.0  PHOS 5.0* 5.0*   Liver Function Tests: Recent Labs  12/13/15 0545 12/14/15 0230  AST 23  --   ALT 51  --   ALKPHOS 54  --   BILITOT 0.9  --   PROT 3.6*  --   ALBUMIN 1.7* 1.7*   Cardiac Enzymes: Recent Labs  12/13/15 1248 12/13/15 1840 12/14/15 0230  TROPONINI 0.04* 0.04* 0.04*   BNP:  B NATRIURETIC PEPTIDE  Date/Time Value Ref Range Status  12/13/2015 12:06 PM 311.7* 0.0 - 100.0 pg/mL Final   ECHO: 03/28 - Left ventricle: The cavity size was normal. Systolic function was  vigorous. The estimated ejection fraction was in the range of 65%  to 70%. Wall motion was normal; there were no regional wall  motion abnormalities. The study is not technically sufficient to  allow evaluation of LV diastolic function. - Aortic valve: Transvalvular velocity was within the normal range.  There was no stenosis. There was no regurgitation. - Mitral valve: Transvalvular velocity was within the normal range.  There was no evidence for stenosis. There was no regurgitation. - Right ventricle: The cavity size was normal. Wall thickness was  normal. Systolic function was normal. - Tricuspid valve: There was mild regurgitation. - Pulmonary arteries: Systolic pressure was mildly increased. PA  peak pressure: 45 mm Hg (S). - Inferior vena cava: The vessel was dilated. The respirophasic  diameter changes were blunted (< 50%). - Left and Right atria normal in size   TELE:    Atrial fib, rate mostly < 100    Radiology/Studies: Dg Chest Port 1 View 12/14/2015  CLINICAL DATA:  Acute on chronic respiratory failure, hip fracture EXAM: PORTABLE CHEST 1 VIEW COMPARISON:  Portable chest x-ray of December 13, 2015 FINDINGS: There are persistent confluent interstitial  densities bilaterally. There is a small left pleural effusion and trace right pleural effusion. There is atelectasis in the left lower lobe. The heart is normal in size. The pulmonary vascularity is not clearly engorged. The endotracheal tube tip lies 3.7 cm above the carina. The esophagogastric tube tip projects below the inferior margin of the image. The left internal jugular venous catheter tip projects over the midportion of the SVC. IMPRESSION: Stable appearance of the chest with widespread confluent interstitial densities worrisome for pulmonary edema and/or pneumonia. Small bilateral pleural effusions greater on the left than on the right. The support tubes are in reasonable position. Electronically Signed   By: David  Martinique M.D.   On: 12/14/2015 07:45   Dg Chest Port 1 View 12/13/2015  CLINICAL DATA:  Respiratory failure EXAM: PORTABLE CHEST 1 VIEW COMPARISON:  12/12/2015 FINDINGS: Cardiac shadow is stable. Increasing bibasilar infiltrates are seen particularly on the left. A nasogastric catheter, endotracheal tube and left jugular central line are again seen and stable. No bony abnormality is noted. IMPRESSION: Increasing infiltrates in the bases bilaterally. Tubes and lines as described. Electronically Signed   By: Inez Catalina M.D.   On: 12/13/2015 07:36   Dg Chest Port 1 View 12/12/2015  CLINICAL DATA:  ACUTE RESPIRATORY FAILURE EXAM: PORTABLE CHEST - 1 VIEW COMPARISON:  12/10/2015 FINDINGS: Endotracheal tube, nasogastric tube, and left IJ central line are stable in position. Progressive patchy airspace opacities in both lung bases. Some increase in perihilar interstitial edema or infiltrates. Heart size upper limits normal. No pneumothorax. No effusion. Atheromatous aorta. Visualized skeletal structures are unremarkable. IMPRESSION: 1. Worsening bibasilar interstitial and airspace infiltrates or edema. 2.  Support hardware stable in position. Electronically Signed   By: Lucrezia Europe M.D.   On:  12/12/2015 13:25    Current Medications:  . amiodarone  200 mg Oral BID  . antiseptic oral rinse  7 mL Mouth Rinse 10 times per day  . ceFEPime (MAXIPIME) IV  1 g Intravenous Q24H  . chlorhexidine gluconate (SAGE KIT)  15 mL Mouth Rinse BID  . furosemide  40 mg Intravenous Q6H  . hydrocortisone sodium succinate  100 mg Intravenous Q12H  . insulin aspart  0-9 Units Subcutaneous 6 times per day  . ipratropium  0.5 mg Nebulization Q6H  . levalbuterol  1.25 mg Nebulization Q6H  . pantoprazole sodium  40 mg Per Tube QHS  . potassium chloride  40 mEq Per Tube Once  . sennosides  5 mL Oral BID  . tamsulosin  0.4 mg Oral BID  . [START ON 12/15/2015] vancomycin  1,000 mg Intravenous Q48H  . warfarin  1 mg Oral ONCE-1800  . Warfarin - Pharmacist Dosing Inpatient   Does not apply q1800   .  amiodarone 30 mg/hr (12/14/15 0000)  . diltiazem (CARDIZEM) infusion 5 mg/hr (12/14/15 0800)  . feeding supplement (VITAL AF 1.2 CAL) 1,000 mL (12/13/15 1537)  . fentaNYL infusion INTRAVENOUS 125 mcg/hr (12/14/15 0530)  . norepinephrine (LEVOPHED) Adult infusion Stopped (12/12/15 1142)  . phenylephrine (NEO-SYNEPHRINE) Adult infusion 45 mcg/min (12/14/15 0600)  .  sodium bicarbonate 150 mEq in sterile water 1000 mL infusion 50 mL/hr at 12/13/15 1534    ASSESSMENT AND PLAN: 1. Tachycardia c/w AF but need to consider MAT 2. A/C kidney injury 3. COPD with respiratory failure, worse overnight. 4. Volume overload, I/O +>20 L since admit  Plan:  1. Continue with the IV amiodarone 2. MD to review ECG, to exclude MAT 3. IV diltiazem for better rate control as BP allows. Currently at 5 mg/hr but pt also on Norepi for pressure support. MD advise on holding Cardizem, continuing amio alone for rate control. 4. MD to review 2 D echocardiogram, results above 5. Cardiac markers show minimal troponin elevation so do not think ischemic eval needed at this time.  Otherwise, per CCM. Dr Nelda Marseille is diuresing pt and K+  supplement ordered Active Problems:   Paroxysmal atrial fibrillation ([place])   Long term current use of anticoagulant therapy   Hip fracture (HCC)   Fracture   Acute on chronic respiratory failure (HCC)   Postoperative anemia due to acute blood loss   Acute respiratory failure with hypoxemia (HCC)   Signed, Barrett, Rhonda , PA-C 11:04 AM 12/14/2015 The patient has been seen in conjunction with Rosaria Ferries, PA-C. All aspects of care have been considered and discussed. The patient has been personally interviewed, examined, and all clinical data has been reviewed.   Significant hypotension developed during the night, requiring phenylephrine drip for blood pressure support.  LVEF by echo is normal to hyperdynamic. Therefore pulmonary congestion is contributed to by acute on chronic diastolic heart failure.  Cardiac markers unremarkable for evidence of acute injury/infarction. BNP is not elevated to any significant degree.  Ventricular response atrial fibrillation is improved on both IV amiodarone and diltiazem.  Patient needs further diuresis as tolerated by the blood pressure.  Agree with diuresis. Continue rate control with the current therapy.  We'll follow with you.

## 2015-12-14 NOTE — Care Management Important Message (Signed)
Important Message  Patient Details  Name: Stephen [name] MRN: [id number] Date of Birth: 1940-10-31   Medicare Important Message Given:  Other (see comment)    [name] Abena 12/14/2015, 1:44 PM

## 2015-12-14 NOTE — Progress Notes (Signed)
PULMONARY / CRITICAL CARE MEDICINE   Name: LOGON UTTECH MRN: [id number] DOB: August 05, 1941    ADMISSION DATE:  12/04/2015 CONSULTATION DATE:  12/12/2015  REFERRING MD:  Dr August Saucer, Ortho  CHIEF COMPLAINT:  Respiratory Distress  HISTORY OF PRESENT ILLNESS:   75 year old man with severe COPD, prednisone and oxygen dependent, atrial fibrillation on Coumadin, hypertension. He was admitted on 12/13/2015 with a right hip fracture after mechanical fall.  He went to the operating room on 3/19 for hip repair. In the PACU he was extubated with some difficulty but then continued to have significant increased work of breathing. PCCM asked to evaluate. Was on BiPAP temporarily and responded well. 3/24 early AM while on floor, developed resp distress again and would not tolerate BiPAP. Moved to ICU for intubation.   SUBJECTIVE:  No events overnight, desaturation noted.  VITAL SIGNS: BP 63/37 mmHg  Pulse 106  Temp(Src) 98.1 F (36.7 C) (Axillary)  Resp 24  Ht  (1.651 m)  Wt 68.8 kg (151 lb 10.8 oz)  BMI 25.24 kg/m2  SpO2 92%  HEMODYNAMICS: CVP:  [15 mmHg-20 mmHg] 15 mmHg  VENTILATOR SETTINGS: Vent Mode:  [-] PSV;CPAP FiO2 (%):  [50 %] 50 % Set Rate:  [20 bmp] 20 bmp Vt Set:  [490 mL] 490 mL PEEP:  [8 cmH20-10 cmH20] 8 cmH20 Pressure Support:  [5 cmH20] 5 cmH20 Plateau Pressure:  [23 cmH20-27 cmH20] 23 cmH20  INTAKE / OUTPUT: I/O last 3 completed shifts: In: 6083 [I.V.:3823; NG/GT:1910; IV Piggyback:350] Out: 2220 [Urine:2220]  PHYSICAL EXAMINATION: General:  Sedated. Chronically ill appearing, intermittently agitated. Integument:  Warm & dry. No rash on exposed skin.  HEENT:  No scleral injection or icterus. Endotracheal tube in place.  Cardiovascular:  Regular rate. No edema. No appreciable JVD.  Pulmonary:  Diffuse crackles. Abdomen: Soft. Hypoactive bowel sounds. Mild abdominal distention. Neurological: RASS-2 on fent gtt, Not following commands. Pupils equal. Grimaces to  pain.9  LABS:  BMET  Recent Labs Lab 12/12/15 0515 12/13/15 0545 12/14/15 0230  NA 143 147* 147*  K 4.6 3.9 3.3*  CL 115* 116* 112*  CO2 20* 22 24  BUN 54* 68* 80*  CREATININE 2.04* 2.87* 3.19*  GLUCOSE 114* 138* 145*   Electrolytes  Recent Labs Lab 12/12/15 0515 12/13/15 0545 12/14/15 0230  CALCIUM 7.3* 7.2* 7.1*  MG 2.2 2.1 2.0  PHOS 4.9* 5.0* 5.0*   CBC  Recent Labs Lab 12/12/15 0515 12/13/15 0545 12/14/15 0230  WBC 22.1* 16.3* 19.9*  HGB 8.2* 7.5* 7.4*  HCT 25.5* 23.2* 22.4*  PLT 334 268 245   Coag's  Recent Labs Lab 12/12/15 0515 12/13/15 0545 12/14/15 0230  INR 4.43* 6.52* 1.57*   Sepsis Markers  Recent Labs Lab 12/10/15 0930 12/11/15 0545 12/12/15 0515  PROCALCITON 7.82 5.22 3.57   ABG  Recent Labs Lab 12/12/15 1819 12/13/15 0915 12/14/15 0830  PHART 7.306* 7.329* 7.345*  PCO2ART 42.8 45.6* 47.4*  PO2ART 74.6* 73.3* 59.7*   Liver Enzymes  Recent Labs Lab 12/12/15 0515 12/13/15 0545 12/14/15 0230  AST  --  23  --   ALT  --  51  --   ALKPHOS  --  54  --   BILITOT  --  0.9  --   ALBUMIN 2.0* 1.7* 1.7*   Cardiac Enzymes  Recent Labs Lab 12/13/15 1248 12/13/15 1840 12/14/15 0230  TROPONINI 0.04* 0.04* 0.04*    Glucose  Recent Labs Lab 12/13/15 1127 12/13/15 1605 12/13/15 1942 12/13/15 2314 12/14/15 0323 12/14/15  0750  GLUCAP 144* 137* 130* 124* 119* 149*    Imaging Dg Chest Port 1 View  12/14/2015  CLINICAL DATA:  Acute on chronic respiratory failure, hip fracture EXAM: PORTABLE CHEST 1 VIEW COMPARISON:  Portable chest x-ray of December 13, 2015 FINDINGS: There are persistent confluent interstitial densities bilaterally. There is a small left pleural effusion and trace right pleural effusion. There is atelectasis in the left lower lobe. The heart is normal in size. The pulmonary vascularity is not clearly engorged. The endotracheal tube tip lies 3.7 cm above the carina. The esophagogastric tube tip projects  below the inferior margin of the image. The left internal jugular venous catheter tip projects over the midportion of the SVC. IMPRESSION: Stable appearance of the chest with widespread confluent interstitial densities worrisome for pulmonary edema and/or pneumonia. Small bilateral pleural effusions greater on the left than on the right. The support tubes are in reasonable position. Electronically Signed   By: David  [place]az[place] M.D.   On: 12/14/2015 07:45    STUDIES:  CT chest 3/23 no mass, pna  MICROBIOLOGY: MRSA PCR 3/19:  Negative  ANTIBIOTICS: Clindamycin post procedure 3/27 cefepime >> 3/28 vanc >>  SIGNIFICANT EVENTS: 3/19 - R Hip Repair 3/27 increased FIO2/PEEP, bicarb gtt for acidosis  LINES/TUBES: OETT 7.5 3/24>>> L IJ CVL 3/24>>> OGT 3/24>>> Foley 3/24>>> PIV  ASSESSMENT / PLAN:  PULMONARY A: Acute Hypoxic Respiratory Failure H/O Severe COPD ARDS 3/27 ? New HCAP P:   Xopenex neb q6hr Change Solu-Medrol IV to q12hr O2 sat goal 88-92% PS trials but no extubation. Diureses as below.  CARDIOVASCULAR A:  Hypotension - Resolved 3/24 but back on pressors 3/26? Secondary to sedation Paroxysmal Atrial Fibrillation - RVR resolved after intubation but back 3/26 H/O HTN  P:  Phenylephrine for MAP goal > 65mmHg Cardiology following Changed PO to amiodarone gtt Echo with EF of 65-70%.  RENAL A:   Acute on Chronic Renal Failure  H/O BPH Metab acidosis P:   Dc IVFs Lasix 40 mg IV q6 x3 doses. Trending UOP with foley Monitoring electrolytes & renal function daily Replace electrolytes as indicated Continue Flomax Bicarb gtt 50 ml/hr, continue for now.  GASTROINTESTINAL A:   Umbilical hernia  P:   Planned follow-up with [facility] as an outpatient Tube feedings @ goal  HEMATOLOGIC A:   ABLA s/p surgery - Hgb Stable. Leukocytosis Coagulopathy - Chronic anticoagulation w/ Coumadin for a-fib.  P:  Transfuse for Hgb <7.0 or active bleeding Coumadin per  pharmacy on hold INR daily - give 5mg  vit K  INFECTIOUS A:   HCAP 3/27  P:   Monitor for fever Procalcitonin slight high - empiric cefepime/ vanc to cover HCAP  ENDOCRINE A:   Hypothyroidism - TSH low. Hyperglycemia - Likely secondary to steroids.  P:   Holding Synthroid for now -resume at somepoint Accu-checks q4hr Low dose SSI per algorithm  NEUROLOGIC A:   Pain Control - Post hip surgery 3/19. Sedation on Ventilator   P:   RASS goal: 0 to -1 BP could not tolerate propofol or precedex Fentanyl gtt & IV prn  FAMILY UPDATES: No family bedside.  The patient is critically ill with multiple organ systems failure and requires high complexity decision making for assessment and support, frequent evaluation and titration of therapies, application of advanced monitoring technologies and extensive interpretation of multiple databases.   Critical Care Time devoted to patient care services described in this note is  35  Minutes. This time reflects time of  care of this signee Dr Koren Bound. This critical care time does not reflect procedure time, or teaching time or supervisory time of PA/NP/Med student/Med Resident etc but could involve care discussion time.  Alyson Reedy, M.D. [facility] Pulmonary/Critical Care Medicine. Pager: [phone]. After hours pager: [phone].  12/14/2015

## 2015-12-15 ENCOUNTER — Inpatient Hospital Stay (HOSPITAL_COMMUNITY): Payer: PPO

## 2015-12-15 DIAGNOSIS — A419 Sepsis, unspecified organism: Secondary | ICD-10-CM | POA: Insufficient documentation

## 2015-12-15 DIAGNOSIS — R6521 Severe sepsis with septic shock: Secondary | ICD-10-CM

## 2015-12-15 LAB — PROTIME-INR
INR: 1.34 (ref 0.00–1.49)
Prothrombin Time: 16.7 seconds — ABNORMAL HIGH (ref 11.6–15.2)

## 2015-12-15 LAB — CBC WITH DIFFERENTIAL/PLATELET
BASOS ABS: 0 10*3/uL (ref 0.0–0.1)
Basophils Relative: 0 %
EOS ABS: 0 10*3/uL (ref 0.0–0.7)
Eosinophils Relative: 0 %
HCT: 23.4 % — ABNORMAL LOW (ref 39.0–52.0)
Hemoglobin: 7.9 g/dL — ABNORMAL LOW (ref 13.0–17.0)
LYMPHS PCT: 1 %
Lymphs Abs: 0.3 10*3/uL — ABNORMAL LOW (ref 0.7–4.0)
MCH: 30.7 pg (ref 26.0–34.0)
MCHC: 33.8 g/dL (ref 30.0–36.0)
MCV: 91.1 fL (ref 78.0–100.0)
Monocytes Absolute: 0.8 10*3/uL (ref 0.1–1.0)
Monocytes Relative: 3 %
NEUTROS ABS: 24 10*3/uL — AB (ref 1.7–7.7)
NEUTROS PCT: 96 %
PLATELETS: 230 10*3/uL (ref 150–400)
RBC: 2.57 MIL/uL — ABNORMAL LOW (ref 4.22–5.81)
RDW: 16.7 % — AB (ref 11.5–15.5)
WBC: 25.1 10*3/uL — ABNORMAL HIGH (ref 4.0–10.5)

## 2015-12-15 LAB — BLOOD GAS, ARTERIAL
Acid-Base Excess: 3.1 mmol/L — ABNORMAL HIGH (ref 0.0–2.0)
BICARBONATE: 28.2 meq/L — AB (ref 20.0–24.0)
Drawn by: 36527
FIO2: 0.5
O2 SAT: 94.8 %
PCO2 ART: 52.1 mmHg — AB (ref 35.0–45.0)
PEEP: 8 cmH2O
PO2 ART: 77.5 mmHg — AB (ref 80.0–100.0)
Patient temperature: 98.6
RATE: 20 resp/min
TCO2: 29.8 mmol/L (ref 0–100)
VT: 490 mL
pH, Arterial: 7.353 (ref 7.350–7.450)

## 2015-12-15 LAB — GLUCOSE, CAPILLARY
GLUCOSE-CAPILLARY: 132 mg/dL — AB (ref 65–99)
GLUCOSE-CAPILLARY: 129 mg/dL — AB (ref 65–99)
Glucose-Capillary: 135 mg/dL — ABNORMAL HIGH (ref 65–99)
Glucose-Capillary: 143 mg/dL — ABNORMAL HIGH (ref 65–99)
Glucose-Capillary: 136 mg/dL — ABNORMAL HIGH (ref 65–99)

## 2015-12-15 LAB — RENAL FUNCTION PANEL
ANION GAP: 15 (ref 5–15)
Albumin: 1.6 g/dL — ABNORMAL LOW (ref 3.5–5.0)
BUN: 96 mg/dL — ABNORMAL HIGH (ref 6–20)
CALCIUM: 7.2 mg/dL — AB (ref 8.9–10.3)
CHLORIDE: 107 mmol/L (ref 101–111)
CO2: 27 mmol/L (ref 22–32)
CREATININE: 3.7 mg/dL — AB (ref 0.61–1.24)
GFR, EST AFRICAN AMERICAN: 17 mL/min — AB (ref >60)
GFR, EST NON AFRICAN AMERICAN: 15 mL/min — AB (ref >60)
Glucose, Bld: 171 mg/dL — ABNORMAL HIGH (ref 65–99)
Phosphorus: 4.8 mg/dL — ABNORMAL HIGH (ref 2.5–4.6)
Potassium: 3.1 mmol/L — ABNORMAL LOW (ref 3.5–5.1)
Sodium: 149 mmol/L — ABNORMAL HIGH (ref 135–145)

## 2015-12-15 LAB — MAGNESIUM: MAGNESIUM: 2.2 mg/dL (ref 1.7–2.4)

## 2015-12-15 MED ORDER — SODIUM CHLORIDE 0.9 % IV SOLN: INTRAVENOUS | Status: DC | PRN

## 2015-12-15 MED ORDER — FUROSEMIDE 10 MG/ML IJ SOLN
10.0000 mg/h | INTRAVENOUS | Status: AC
  Administered 2015-12-15: 10 mg/h via INTRAVENOUS
  Filled 2015-12-15: qty 25

## 2015-12-15 MED ORDER — POTASSIUM CHLORIDE 10 MEQ/50ML IV SOLN
10.0000 meq | INTRAVENOUS | Status: AC
  Administered 2015-12-15 (×4): 10 meq via INTRAVENOUS
  Filled 2015-12-15 (×4): qty 50

## 2015-12-15 MED ORDER — LEVOTHYROXINE SODIUM 75 MCG PO TABS
75.0000 ug | ORAL_TABLET | Freq: Every day | ORAL | Status: DC
  Administered 2015-12-15 – 2015-12-17 (×3): 75 ug via ORAL
  Filled 2015-12-15 (×4): qty 1

## 2015-12-15 MED ORDER — WARFARIN SODIUM 1 MG PO TABS
1.0000 mg | ORAL_TABLET | Freq: Once | ORAL | Status: AC
  Administered 2015-12-15: 1 mg via ORAL
  Filled 2015-12-15: qty 1

## 2015-12-15 MED ORDER — POTASSIUM CHLORIDE 20 MEQ/15ML (10%) PO SOLN
40.0000 meq | Freq: Once | ORAL | Status: AC
  Administered 2015-12-15: 40 meq via ORAL
  Filled 2015-12-15: qty 30

## 2015-12-15 MED ORDER — METOLAZONE 10 MG PO TABS
10.0000 mg | ORAL_TABLET | Freq: Every day | ORAL | Status: AC
  Administered 2015-12-15: 10 mg via ORAL
  Filled 2015-12-15: qty 1

## 2015-12-15 NOTE — Procedures (Signed)
Arterial Catheter Insertion Procedure Note Stephen NovelDon [name] [id number] Stephen [name]  Procedure: Insertion of Arterial Catheter  Indications: Blood pressure monitoring and Frequent blood sampling  Procedure Details Consent: Risks of procedure as well as the alternatives and risks of each were explained to the (patient/caregiver).  Consent for procedure obtained. Time Out: Verified patient identification, verified procedure, site/side was marked, verified correct patient position, special equipment/implants available, medications/allergies/relevent history reviewed, required imaging and test results available.  Performed  Maximum sterile technique was used including antiseptics, cap, gloves, gown, hand hygiene, mask and sheet. Skin prep: Chlorhexidine; local anesthetic administered 20 gauge catheter was inserted into right radial artery using the Seldinger technique.  Evaluation Blood flow good; BP tracing good. Complications: No apparent complications.   Stephen BoundYACOUB,[name] 12/15/2015

## 2015-12-15 NOTE — Progress Notes (Signed)
Patient Name: Stephen [name] Date of Encounter: 12/15/2015  Active Problems:   Paroxysmal atrial fibrillation ([place])   Long term current use of anticoagulant therapy   Hip fracture (HCC)   Fracture   Acute on chronic respiratory failure (HCC)   Postoperative anemia due to acute blood loss   Acute respiratory failure with hypoxemia [facility])   Primary Cardiologist: Dr Stanford Breed Patient Profile: 75 y.o. male with a history of PAF on amio and coumadin, severe COPD on home o2, hypothyroidism and HTN who was admitted to [facility] on 12/13/2015 for hip fracture after a mechanical fall. He underwent ORIF on 11/22/2015. Re-intubated on 03/24. Cards consult 03/22 for afib.  SUBJECTIVE: Sedated on the vent  OBJECTIVE Filed Vitals:   12/15/15 1015 12/15/15 1030 12/15/15 1045 12/15/15 1100  BP: 100/69 119/104 96/68 90/56  Pulse: 107 105 100 91  Temp:      TempSrc:      Resp: '22 21 20 20  '$ Height:      Weight:      SpO2: 100% 100% 99% 99%    Intake/Output Summary (Last 24 hours) at 12/15/15 1140 Last data filed at 12/15/15 1120  Gross per 24 hour  Intake 3363.6 ml  Output   1440 ml  Net 1923.6 ml   Filed Weights   12/13/15 0218 12/14/15 0800 12/15/15 0459  Weight: 151 lb 10.8 oz (68.8 kg) 154 lb 8.7 oz (70.1 kg) 160 lb 7.9 oz (72.8 kg)    PHYSICAL EXAM General: Well developed, well nourished, male in no acute distress. Head: Normocephalic, atraumatic.  Neck: Supple without bruits, JVD 11 cm. Lungs:  Resp regular and unlabored, rales bases. Heart: Irreg R&R, S1, S2, no S3, S4, or murmur; no rub. Abdomen: Soft, non-tender, non-distended, BS + x 4.  Extremities: No clubbing, cyanosis, 1-2+ edema.   LABS: CBC: Recent Labs  12/14/15 0230 12/15/15 0512  WBC 19.9* 25.1*  NEUTROABS 19.0* 24.0*  HGB 7.4* 7.9*  HCT 22.4* 23.4*  MCV 90.7 91.1  PLT 245 230   INR: Recent Labs  12/15/15 0512  INR 4.48   Basic Metabolic Panel: Recent Labs  12/14/15 0230 12/15/15 0512  NA 147*  149*  K 3.3* 3.1*  CL 112* 107  CO2 24 27  GLUCOSE 145* 171*  BUN 80* 96*  CREATININE 3.19* 3.70*  CALCIUM 7.1* 7.2*  MG 2.0 2.2  PHOS 5.0* 4.8*   Liver Function Tests: Recent Labs  12/13/15 0545 12/14/15 0230 12/15/15 0512  AST 23  --   --   ALT 51  --   --   ALKPHOS 54  --   --   BILITOT 0.9  --   --   PROT 3.6*  --   --   ALBUMIN 1.7* 1.7* 1.6*   Cardiac Enzymes: Recent Labs  12/13/15 1248 12/13/15 1840 12/14/15 0230  TROPONINI 0.04* 0.04* 0.04*   BNP:  B NATRIURETIC PEPTIDE  Date/Time Value Ref Range Status  12/13/2015 12:06 PM 311.7* 0.0 - 100.0 pg/mL Final   TELE: atrial fib, rate elevated when pt agitated, generally close to 100 otherwise       Radiology/Studies: Dg Chest Port 1 View 12/15/2015  CLINICAL DATA:  75 year old male with asthma and COPD. Subsequent encounter. EXAM: PORTABLE CHEST 1 VIEW COMPARISON:  12/14/2015. FINDINGS: Endotracheal tube tip 5.8 cm above the carina. Left central line tip proximal superior vena cava level. Nasogastric tube courses below the diaphragm. Tip is not included on the present exam. Chronic  lung changes with superimposed pulmonary vascular congestion. Asymmetric airspace disease most notable lung bases with consolidation medial aspect left lung base. No gross pneumothorax. Heart size top-normal. Aortic calcification. IMPRESSION: No significant change. Chronic lung changes with superimposed pulmonary vascular congestion. Asymmetric airspace disease most notable lung bases with consolidation medial aspect left lung base. Electronically Signed   By: Genia Del M.D.   On: 12/15/2015 07:32   Dg Chest Port 1 View  12/14/2015  CLINICAL DATA:  Acute on chronic respiratory failure, hip fracture EXAM: PORTABLE CHEST 1 VIEW COMPARISON:  Portable chest x-ray of December 13, 2015 FINDINGS: There are persistent confluent interstitial densities bilaterally. There is a small left pleural effusion and trace right pleural effusion. There is  atelectasis in the left lower lobe. The heart is normal in size. The pulmonary vascularity is not clearly engorged. The endotracheal tube tip lies 3.7 cm above the carina. The esophagogastric tube tip projects below the inferior margin of the image. The left internal jugular venous catheter tip projects over the midportion of the SVC. IMPRESSION: Stable appearance of the chest with widespread confluent interstitial densities worrisome for pulmonary edema and/or pneumonia. Small bilateral pleural effusions greater on the left than on the right. The support tubes are in reasonable position. Electronically Signed   By: David  Martinique M.D.   On: 12/14/2015 07:45     Current Medications:  . antiseptic oral rinse  7 mL Mouth Rinse 10 times per day  . ceFEPime (MAXIPIME) IV  1 g Intravenous Q24H  . chlorhexidine gluconate (SAGE KIT)  15 mL Mouth Rinse BID  . hydrocortisone sodium succinate  100 mg Intravenous Q12H  . insulin aspart  0-9 Units Subcutaneous 6 times per day  . ipratropium  0.5 mg Nebulization Q6H  . levalbuterol  1.25 mg Nebulization Q6H  . levothyroxine  75 mcg Oral QAC breakfast  . pantoprazole sodium  40 mg Per Tube QHS  . potassium chloride  10 mEq Intravenous Q1 Hr x 4  . sennosides  5 mL Oral BID  . tamsulosin  0.4 mg Oral BID  . vancomycin  1,000 mg Intravenous Q48H  . warfarin  1 mg Oral ONCE-1800  . Warfarin - Pharmacist Dosing Inpatient   Does not apply q1800   . amiodarone 30 mg/hr (12/15/15 0100)  . diltiazem (CARDIZEM) infusion Stopped (12/14/15 2100)  . feeding supplement (VITAL AF 1.2 CAL) 1,000 mL (12/15/15 1025)  . fentaNYL infusion INTRAVENOUS 175 mcg/hr (12/15/15 0500)  . furosemide (LASIX) infusion    . norepinephrine (LEVOPHED) Adult infusion Stopped (12/12/15 1142)  . phenylephrine (NEO-SYNEPHRINE) Adult infusion 30 mcg/min (12/15/15 0830)  .  sodium bicarbonate 150 mEq in sterile water 1000 mL infusion Stopped (12/15/15 1000)    ASSESSMENT AND PLAN: 1.  Tachycardia c/w AF but need to consider MAT 2. A/C kidney injury 3. COPD with respiratory failure, worse overnight. 4. Volume overload, I/O +>20 L since admit  Plan:  1. Continue with the IV amiodarone 2. IV diltiazem d/c'd due to HR sustaining < 100 and BP low.  Contine amio alone for rate control. 3. Now on Lasix gtt and Zaroxolyn given 4. Multiple runs KCl ordered.  From Dr Thompson Caul note 03/29  Significant hypotension developed, still requiring phenylephrine drip for blood pressure support.  LVEF by echo is normal to hyperdynamic. Therefore pulmonary congestion is contributed to by acute on chronic diastolic heart failure.  Cardiac markers unremarkable for evidence of acute injury/infarction. BNP is not elevated to any significant degree.  Ventricular response atrial fibrillation is improved on IV amiodarone (even without Diltiazem)  Patient needs further diuresis as tolerated by the blood pressure.  Agree with diuresis. Continue rate control with the current therapy.  We'll follow with you.  Otherwise, per CCM. Dr Nelda Marseille is diuresing pt and K+ supplement ordered Active Problems:   Paroxysmal atrial fibrillation ([place])   Long term current use of anticoagulant therapy   Hip fracture (HCC)   Fracture   Acute on chronic respiratory failure (HCC)   Postoperative anemia due to acute blood loss   Acute respiratory failure with hypoxemia (HCC)   Signed, Barrett, Rhonda , PA-C 11:40 AM 12/15/2015 The patient has been seen in conjunction with Rosaria Ferries, PA-C. All aspects of care have been considered and discussed. The patient has been personally interviewed, examined, and all clinical data has been reviewed.   Agree with note and recommendations as outlined

## 2015-12-15 NOTE — Progress Notes (Signed)
Plan is to have family meeting tomorrow 3/31 around 9 AM.

## 2015-12-15 NOTE — Progress Notes (Signed)
eLink Physician-Brief Progress Note Patient Name: Drue NovelDon S Garringer DOB: Feb 18, 1941 MRN: [id number]   Date of Service  12/15/2015  HPI/Events of Note  Patient is biting on ETT and grimacing.  Current 200 mcg of cont infusion fentanyl not holding patient.  Has PRN versed but nurse has not used.  eICU Interventions  Nurse to try prn versed Increase in fentanyl gtt to 300 mcg.     Intervention Category Intermediate Interventions: Pain - evaluation and management  Jomar Denz 12/15/2015, 11:53 PM

## 2015-12-15 NOTE — Progress Notes (Signed)
ANTICOAGULATION CONSULT NOTE - Follow Up Consult  Pharmacy Consult for warfarin Indication: atrial fibrillation  Allergies  Allergen Reactions  . Penicillins Other (See Comments)    REACTION: feels drunk    Patient Measurements: Height: 5\' 5"  (165.1 cm) Weight: 160 lb 7.9 oz (72.8 kg) IBW/kg (Calculated) : 61.5  Vital Signs: Temp: 98.6 F (37 C) (03/30 0400) Temp Source: Axillary (03/30 0400) BP: 87/65 mmHg (03/30 0700) Pulse Rate: 97 (03/30 0700)  Labs:  Recent Labs  12/13/15 0545 12/13/15 1248 12/13/15 1840 12/14/15 0230 12/15/15 0512  HGB 7.5*  --   --  7.4* 7.9*  HCT 23.2*  --   --  22.4* 23.4*  PLT 268  --   --  245 230  LABPROT 54.9*  --   --  18.8* 16.7*  INR 6.52*  --   --  1.57* 1.34  CREATININE 2.87*  --   --  3.19* 3.70*  TROPONINI  --  0.04* 0.04* 0.04*  --     Estimated Creatinine Clearance: 15 mL/min (by C-G formula based on Cr of 3.7).  Assessment: [age] YOM s/p R femoral nailing for subtrochanteric fracture (OR on 3/18) On Warfarin 2.5mg  daily exc 5mg  on Mon and Fri and 3.75mg  on Wed PTA for AFib. Dose with corresponding INR as below. CBC stable with no sxs of bleeding. Remains on IV amiodarone.  Date 3/18 3/19 3/20 3/21 3/22 3/23 3/24 3/25 3/26 3/27 3/28 3/29 3/30 3/31 4/1  INR 2.1 2.1 1.77 1.71 2.49 2.63 2.91 3.82 3.61 4.43 6.5 1.57 1.34    Dose Hold 5mg  5mg  5mg  1 mg 2.5  2.5  Hold Hold Hold Hold Not given  1 mg    Vit K 1mg           5 mg IV         Goal of Therapy:  INR 2-3   Plan:  1. Resume warfarin at conservative dose of 1 mg x 1 tonight; unknown why dose 3/29 pm was not administered 2. Monitor daily INR, CBC, s/s of bleed   Pollyann SamplesAndy Mayli Covington, PharmD, BCPS 12/15/2015, 8:07 AM Pager: [phone]        /

## 2015-12-15 NOTE — Progress Notes (Signed)
Met with pt's daughter, Gladden at bedside; offered emotional support.  She verbalizes that her mom and dad "wouldn't have wanted all of this", but "it just happened so fast."  She hopes to gain some clarity with decision making at family meeting scheduled for tomorrow AM.    Will continue to follow.    Reinaldo Raddle, RN, BSN  Trauma/Neuro ICU Case Manager [phone]

## 2015-12-15 NOTE — Progress Notes (Signed)
Attempted to draw morning ABG from A-line. Unable to pull back, wave form not strong but reading. RN stated she had previously had trouble pulling back on A-line as well. Tried to reposition hand and still unable to draw back, but A-line would flush. Eventually lost wave form and had to remove A-line. RN aware and will notify MD.

## 2015-12-15 NOTE — Clinical Social Work Note (Signed)
Clinical Social Worker continuing to follow patient and family for support and discharge planning needs.  Patient remains on the ventilator with pressors at this time.  Family meeting scheduled for tomorrow regarding patient plan of care.  CSW remains available for support as needed.  Macario GoldsJesse Elma Limas, Kentucky[place]CSW [phone]

## 2015-12-15 NOTE — Progress Notes (Signed)
This note also relates to the following rows which could not be included: SpO2 - Cannot attach notes to unvalidated device data   Called because nurse hearing leak.  Tube was at 22Cm    Deflated cuff and inserted back to 24cm at lips.  Tube reinflated

## 2015-12-15 NOTE — Progress Notes (Signed)
PULMONARY / CRITICAL CARE MEDICINE   Name: Stephen [name] MRN: [id number] DOB: Jun 05, 1941    ADMISSION DATE:  12/16/2015 CONSULTATION DATE:  12/12/2015  REFERRING MD:  Dr August Saucer, Ortho  CHIEF COMPLAINT:  Respiratory Distress  HISTORY OF PRESENT ILLNESS:   75 year old man with severe COPD, prednisone and oxygen dependent, atrial fibrillation on Coumadin, hypertension. He was admitted on 11/17/2015 with a right hip fracture after mechanical fall.  He went to the operating room on 3/19 for hip repair. In the PACU he was extubated with some difficulty but then continued to have significant increased work of breathing. PCCM asked to evaluate. Was on BiPAP temporarily and responded well. 3/24 early AM while on floor, developed resp distress again and would not tolerate BiPAP. Moved to ICU for intubation.   SUBJECTIVE:  No events overnight, desaturation noted.  VITAL SIGNS: BP 87/65 mmHg  Pulse 97  Temp(Src) 97.5 F (36.4 C) (Axillary)  Resp 20  Ht  (1.651 m)  Wt 72.8 kg (160 lb 7.9 oz)  BMI 26.71 kg/m2  SpO2 97%  HEMODYNAMICS: CVP:  [13 mmHg-17 mmHg] 16 mmHg  VENTILATOR SETTINGS: Vent Mode:  [-] PRVC FiO2 (%):  [50 %] 50 % Set Rate:  [20 bmp] 20 bmp Vt Set:  [490 mL] 490 mL PEEP:  [8 cmH20] 8 cmH20 Pressure Support:  [5 cmH20] 5 cmH20 Plateau Pressure:  [13 cmH20-20 cmH20] 13 cmH20  INTAKE / OUTPUT: I/O last 3 completed shifts: In: 5278.8 [I.V.:3248.8; NG/GT:1980; IV Piggyback:50] Out: 2220 [Urine:2220]  PHYSICAL EXAMINATION: General:  Sedated. Chronically ill appearing, intermittently agitated.  Swelling Integument:  Warm & dry. No rash on exposed skin.  HEENT:  No scleral injection or icterus. Endotracheal tube in place.  Cardiovascular:  Regular rate. No edema. No appreciable JVD.  Pulmonary:  Diffuse crackles. Abdomen: Soft. Hypoactive bowel sounds. Mild abdominal distention. Neurological: RASS-2 on fent gtt, Not following commands. Pupils equal. Grimaces to  pain.9  LABS:  BMET  Recent Labs Lab 12/13/15 0545 12/14/15 0230 12/15/15 0512  NA 147* 147* 149*  K 3.9 3.3* 3.1*  CL 116* 112* 107  CO2 BUN 68* 80* 96*  CREATININE 2.87* 3.19* 3.70*  GLUCOSE 138* 145* 171*   Electrolytes  Recent Labs Lab 12/13/15 0545 12/14/15 0230 12/15/15 0512  CALCIUM 7.2* 7.1* 7.2*  MG 2.1 2.0 2.2  PHOS 5.0* 5.0* 4.8*   CBC  Recent Labs Lab 12/13/15 0545 12/14/15 0230 12/15/15 0512  WBC 16.3* 19.9* 25.1*  HGB 7.5* 7.4* 7.9*  HCT 23.2* 22.4* 23.4*  PLT 268 245 230   Coag's  Recent Labs Lab 12/13/15 0545 12/14/15 0230 12/15/15 0512  INR 6.52* 1.57* 1.34   Sepsis Markers  Recent Labs Lab 12/10/15 0930 12/11/15 0545 12/12/15 0515  PROCALCITON 7.82 5.22 3.57   ABG  Recent Labs Lab 12/13/15 0915 12/14/15 0830 12/15/15 0420  PHART 7.329* 7.345* 7.353  PCO2ART 45.6* 47.4* 52.1*  PO2ART 73.3* 59.7* 77.5*   Liver Enzymes  Recent Labs Lab 12/13/15 0545 12/14/15 0230 12/15/15 0512  AST 23  --   --   ALT 51  --   --   ALKPHOS 54  --   --   BILITOT 0.9  --   --   ALBUMIN 1.7* 1.7* 1.6*   Cardiac Enzymes  Recent Labs Lab 12/13/15 1248 12/13/15 1840 12/14/15 0230  TROPONINI 0.04* 0.04* 0.04*   Glucose  Recent Labs Lab 12/14/15 1144 12/14/15 1541 12/14/15 1942 12/14/15 2318 12/15/15 0321 12/15/15  0810  GLUCAP 118* 155* 148* 112* 132* 135*   Imaging Dg Chest Port 1 View  12/15/2015  CLINICAL DATA:  75 year old male with asthma and COPD. Subsequent encounter. EXAM: PORTABLE CHEST 1 VIEW COMPARISON:  12/14/2015. FINDINGS: Endotracheal tube tip 5.8 cm above the carina. Left central line tip proximal superior vena cava level. Nasogastric tube courses below the diaphragm. Tip is not included on the present exam. Chronic lung changes with superimposed pulmonary vascular congestion. Asymmetric airspace disease most notable lung bases with consolidation medial aspect left lung base. No gross  pneumothorax. Heart size top-normal. Aortic calcification. IMPRESSION: No significant change. Chronic lung changes with superimposed pulmonary vascular congestion. Asymmetric airspace disease most notable lung bases with consolidation medial aspect left lung base. Electronically Signed   By: Lacy Duverney M.D.   On: 12/15/2015 07:32   STUDIES:  CT chest 3/23 no mass, pna  MICROBIOLOGY: MRSA PCR 3/19:  Negative Sputum 7/28>>>SERRATIA MARCESCENS  ANTIBIOTICS: Clindamycin post procedure 3/27 cefepime >> 3/28 vanc >>  SIGNIFICANT EVENTS: 3/19 - R Hip Repair 3/27 increased FIO2/PEEP, bicarb gtt for acidosis  LINES/TUBES: OETT 7.5 3/24>>> L IJ CVL 3/24>>> OGT 3/24>>> Foley 3/24>>> PIV  ASSESSMENT / PLAN:  PULMONARY A: Acute Hypoxic Respiratory Failure H/O Severe COPD ARDS 3/27 ? New HCAP P:   Xopenex neb q6 hr. Change Solu-Medrol IV to q12 hr. O2 sat goal 88-92%. Diureses as below.  CARDIOVASCULAR A:  Hypotension - Resolved 3/24 but back on pressors 3/26? Secondary to sedation Paroxysmal Atrial Fibrillation - RVR resolved after intubation but back 3/26 H/O HTN  P:  Phenylephrine for MAP goal > Cardiology following Changed PO to amiodarone gtt Echo with EF of 65-70%.  RENAL A:   Acute on Chronic Renal Failure  H/O BPH Metab acidosis P:   Dc IVFs Lasix drip at 10 mg/hr. Trending UOP with foley. Monitoring electrolytes & renal function daily. Replace electrolytes as indicated. Continue Flomax. D/C bicarb drip. Zaroxolyn 10 mg x1.  GASTROINTESTINAL A:   Umbilical hernia  P:   Planned follow-up with [facility] as an outpatient Tube feedings @ goal  HEMATOLOGIC A:   ABLA s/p surgery - Hgb Stable. Leukocytosis Coagulopathy - Chronic anticoagulation w/ Coumadin for a-fib.  P:  Transfuse for Hgb <7.0 or active bleeding Coumadin per pharmacy on hold INR daily - give  vit K  INFECTIOUS A:   HCAP 3/27 with SERRATIA MARCESCENS  P:   Monitor for  fever Procalcitonin slight high - empiric cefepime/ vanc to cover HCAP  ENDOCRINE A:   Hypothyroidism - TSH low. Hyperglycemia - Likely secondary to steroids.  P:   Restart synthroid 75 mcg PO daily. Accu-checks q4hr. Low dose SSI per algorithm.  NEUROLOGIC A:   Pain Control - Post hip surgery 3/19. Sedation on Ventilator   P:   RASS goal: 0 to -1. BP could not tolerate propofol or precedex. Fentanyl gtt.  FAMILY UPDATES: No family bedside.  The patient is critically ill with multiple organ systems failure and requires high complexity decision making for assessment and support, frequent evaluation and titration of therapies, application of advanced monitoring technologies and extensive interpretation of multiple databases.   Critical Care Time devoted to patient care services described in this note is  35  Minutes. This time reflects time of care of this signee Dr Koren Bound. This critical care time does not reflect procedure time, or teaching time or supervisory time of PA/NP/Med student/Med Resident etc but could involve care discussion time.  Annalysse Shoemaker  Santa GeneraG. [place] Kretz, M.D. [facility]. Pager: [phone]. After hours pager: [phone].  12/15/2015

## 2015-12-16 ENCOUNTER — Ambulatory Visit: Payer: PPO

## 2015-12-16 ENCOUNTER — Telehealth: Payer: Self-pay | Admitting: Internal Medicine

## 2015-12-16 ENCOUNTER — Inpatient Hospital Stay (HOSPITAL_COMMUNITY): Payer: PPO

## 2015-12-16 LAB — POCT I-STAT 3, ART BLOOD GAS (G3+)
ACID-BASE EXCESS: 1 mmol/L (ref 0.0–2.0)
Bicarbonate: 27.2 mEq/L — ABNORMAL HIGH (ref 20.0–24.0)
O2 SAT: 96 %
PH ART: 7.374 (ref 7.350–7.450)
PO2 ART: 86 mmHg (ref 80.0–100.0)
Patient temperature: 98.6
TCO2: 29 mmol/L (ref 0–100)
pCO2 arterial: 46.6 mmHg — ABNORMAL HIGH (ref 35.0–45.0)

## 2015-12-16 LAB — CBC WITH DIFFERENTIAL/PLATELET
BASOS ABS: 0 10*3/uL (ref 0.0–0.1)
BASOS PCT: 0 %
EOS ABS: 0 10*3/uL (ref 0.0–0.7)
EOS PCT: 0 %
HCT: 22.5 % — ABNORMAL LOW (ref 39.0–52.0)
HEMOGLOBIN: 7.6 g/dL — AB (ref 13.0–17.0)
Lymphocytes Relative: 1 %
Lymphs Abs: 0.3 10*3/uL — ABNORMAL LOW (ref 0.7–4.0)
MCH: 30.8 pg (ref 26.0–34.0)
MCHC: 33.8 g/dL (ref 30.0–36.0)
MCV: 91.1 fL (ref 78.0–100.0)
Monocytes Absolute: 1 10*3/uL (ref 0.1–1.0)
Monocytes Relative: 4 %
NEUTROS PCT: 95 %
Neutro Abs: 22 10*3/uL — ABNORMAL HIGH (ref 1.7–7.7)
PLATELETS: 179 10*3/uL (ref 150–400)
RBC: 2.47 MIL/uL — AB (ref 4.22–5.81)
RDW: 16.8 % — ABNORMAL HIGH (ref 11.5–15.5)
WBC: 23.3 10*3/uL — AB (ref 4.0–10.5)

## 2015-12-16 LAB — RENAL FUNCTION PANEL
ALBUMIN: 1.6 g/dL — AB (ref 3.5–5.0)
Anion gap: 11 (ref 5–15)
BUN: 113 mg/dL — ABNORMAL HIGH (ref 6–20)
CALCIUM: 7.3 mg/dL — AB (ref 8.9–10.3)
CHLORIDE: 105 mmol/L (ref 101–111)
CO2: 30 mmol/L (ref 22–32)
CREATININE: 4.18 mg/dL — AB (ref 0.61–1.24)
GFR, EST AFRICAN AMERICAN: 15 mL/min — AB (ref >60)
GFR, EST NON AFRICAN AMERICAN: 13 mL/min — AB (ref >60)
Glucose, Bld: 161 mg/dL — ABNORMAL HIGH (ref 65–99)
PHOSPHORUS: 3.7 mg/dL (ref 2.5–4.6)
Potassium: 3.6 mmol/L (ref 3.5–5.1)
SODIUM: 146 mmol/L — AB (ref 135–145)

## 2015-12-16 LAB — PROTIME-INR
INR: 1.36 (ref 0.00–1.49)
PROTHROMBIN TIME: 16.9 s — AB (ref 11.6–15.2)

## 2015-12-16 LAB — GLUCOSE, CAPILLARY
GLUCOSE-CAPILLARY: 127 mg/dL — AB (ref 65–99)
GLUCOSE-CAPILLARY: 128 mg/dL — AB (ref 65–99)
GLUCOSE-CAPILLARY: 132 mg/dL — AB (ref 65–99)
GLUCOSE-CAPILLARY: 141 mg/dL — AB (ref 65–99)
Glucose-Capillary: 125 mg/dL — ABNORMAL HIGH (ref 65–99)
Glucose-Capillary: 154 mg/dL — ABNORMAL HIGH (ref 65–99)

## 2015-12-16 LAB — CULTURE, RESPIRATORY

## 2015-12-16 LAB — CULTURE, RESPIRATORY W GRAM STAIN

## 2015-12-16 LAB — MAGNESIUM: Magnesium: 2.1 mg/dL (ref 1.7–2.4)

## 2015-12-16 MED ORDER — MIDAZOLAM HCL 2 MG/2ML IJ SOLN
1.0000 mg | INTRAMUSCULAR | Status: DC | PRN
  Administered 2015-12-16 – 2015-12-18 (×8): 1 mg via INTRAVENOUS
  Filled 2015-12-16 (×9): qty 2

## 2015-12-16 MED ORDER — FUROSEMIDE 10 MG/ML IJ SOLN
10.0000 mg/h | INTRAVENOUS | Status: AC
  Administered 2015-12-16 – 2015-12-17 (×2): 10 mg/h via INTRAVENOUS
  Filled 2015-12-16 (×3): qty 25

## 2015-12-16 MED ORDER — POTASSIUM CHLORIDE 20 MEQ/15ML (10%) PO SOLN
40.0000 meq | Freq: Once | ORAL | Status: AC
  Administered 2015-12-16: 40 meq
  Filled 2015-12-16: qty 30

## 2015-12-16 MED ORDER — METOLAZONE 10 MG PO TABS
10.0000 mg | ORAL_TABLET | Freq: Once | ORAL | Status: AC
  Administered 2015-12-16: 10 mg via ORAL
  Filled 2015-12-16: qty 1

## 2015-12-16 MED ORDER — SODIUM CHLORIDE 0.9 % IV BOLUS (SEPSIS)
500.0000 mL | Freq: Once | INTRAVENOUS | Status: AC
  Administered 2015-12-16: 500 mL via INTRAVENOUS

## 2015-12-16 MED ORDER — WARFARIN SODIUM 2 MG PO TABS
2.0000 mg | ORAL_TABLET | Freq: Once | ORAL | Status: AC
Start: 2015-12-16 — End: 2015-12-16
  Administered 2015-12-16: 2 mg via ORAL
  Filled 2015-12-16: qty 1

## 2015-12-16 NOTE — Care Management Note (Signed)
Case Management Note  Patient Details  Name: Stephen [name] MRN: [id number] Date of Birth: [birth year]  Subjective/Objective:                    Action/Plan:  UR updated CM will continue to follow  Expected Discharge Date:                  Expected Discharge Plan:     In-House Referral:  Clinical Social Work  Discharge planning Services  CM Consult  Post Acute Care Choice:  Durable Medical Equipment, Resumption of Svcs/PTA Provider Choice offered to:  Patient  DME Arranged:  Oxygen DME Agency:  [facility]  HH Arranged:    HH Agency:     Status of Service:  In process, will continue to follow  Medicare Important Message Given:  Other (see comment) Date Medicare IM Given:    Medicare IM give by:    Date Additional Medicare IM Given:    Additional Medicare Important Message give by:     If discussed at Long Length of Stay Meetings, dates discussed:    Additional Comments:  Kingsley PlanWile, Stephen Blowe Marie, RN 12/16/2015, 8:49 AM

## 2015-12-16 NOTE — Progress Notes (Signed)
Pharmacy Antibiotic Note Stephen [name] is a 75 y.o. male admitted on 11/29/2015 that is currently on day 5 of Cefepime and vancomycin for coverage of PNA.  Plan: 1. Spoke with CCM will plan to stop vancomycin and continue Cefepime for Serratia isolated in sputum culture on 3/28 2. Monitor renal fxn and make empiric adjustments to cefepime as needed 3. F/u on planned LOT with Cefepime     Height: 5\' 5"  (165.1 cm) Weight: 159 lb 6.3 oz (72.3 kg) IBW/kg (Calculated) : 61.5  Temp (24hrs), Avg:98.7 F (37.1 C), Min:97.6 F (36.4 C), Max:99.7 F (37.6 C)   Recent Labs Lab 12/12/15 0515 12/13/15 0545 12/14/15 0230 12/15/15 0512 12/16/15 0605  WBC 22.1* 16.3* 19.9* 25.1* 23.3*  CREATININE 2.04* 2.87* 3.19* 3.70* 4.18*    Estimated Creatinine Clearance: 13.3 mL/min (by C-G formula based on Cr of 4.18).    Allergies  Allergen Reactions  . Penicillins Other (See Comments)    REACTION: feels drunk    Antimicrobials this admission: 3/27 Cefepime >>  3/28 vancomycin  >> 3/31  Dose adjustments this admission: n/a  Microbiology results: 3/28 Sputum: S. Marcescens S to cefepime   Thank you for allowing pharmacy to be a part of this patient's care.  Pollyann SamplesAndy Davyd [name], PharmD, BCPS 12/16/2015, 9:56 AM Pager: [phone]

## 2015-12-16 NOTE — Telephone Encounter (Signed)
Spoke to pt's daughter Stephen [name], she said pt has been in the hospital since March 18th and ICU x one week, not doing well has to make a decision on end of life course by Sunday. Stephen [name] I am so sorry, asked her if she wanted to speak to Dr.K? Stephen [name] said no not now but maybe later, she just wanted to let Dr.K now. Told her okay I will tell him and if you need anything please call. Stephen [name] verbalized understanding.

## 2015-12-16 NOTE — Telephone Encounter (Signed)
Daughter called in stating patient is in the hospital and will have to make a decision on life soon.  Cancelled appt for coum today.

## 2015-12-16 NOTE — Progress Notes (Signed)
Subjective: Pt intubated    Objective: Vital signs in last 24 hours: Temp:  [97.6 F (36.4 C)-99.7 F (37.6 C)] 97.6 F (36.4 C) (03/31 0800) Pulse Rate:  [75-115] 75 (03/31 1128) Resp:  [11-26] 20 (03/31 1128) BP: (69-153)/(27-105) 86/66 mmHg (03/31 0730) SpO2:  [91 %-100 %] 100 % (03/31 1128) Arterial Line BP: (98-176)/(42-68) 120/48 mmHg (03/31 0530) FiO2 (%):  [50 %] 50 % (03/31 1128) Weight:  [72.3 kg (159 lb 6.3 oz)] 72.3 kg (159 lb 6.3 oz) (03/31 0416)  Intake/Output from previous day: 03/30 0701 - 03/31 0700 In: 3219.3 [I.V.:1359.3; NG/GT:1360; IV Piggyback:500] Out: 1895 [Urine:1895] Intake/Output this shift:    Exam:  edama in both legs - staples not ready for removal  Labs:  Recent Labs  12/14/15 0230 12/15/15 0512 12/16/15 0605  HGB 7.4* 7.9* 7.6*    Recent Labs  12/15/15 0512 12/16/15 0605  WBC 25.1* 23.3*  RBC 2.57* 2.47*  HCT 23.4* 22.5*  PLT 230 179    Recent Labs  12/15/15 0512 12/16/15 0605  NA 149* 146*  K 3.1* 3.6  CL 107 105  CO2 27 30  BUN 96* 113*  CREATININE 3.70* 4.18*  GLUCOSE 171* 161*  CALCIUM 7.2* 7.3*    Recent Labs  12/15/15 0512 12/16/15 0605  INR 1.34 1.36    Assessment/Plan: Gradual organ system failure in progress Will follow   DEAN,GREGORY SCOTT 12/16/2015, 11:51 AM

## 2015-12-16 NOTE — Progress Notes (Signed)
Art line out of patients R radial. Dr. Darrick Pennaeterding notified. Orders received to leave art line out at this time and to monitor patient's BP using the cuff pressure. Neosynephrine currently off. Will continue to monitor

## 2015-12-16 NOTE — Telephone Encounter (Signed)
Dr. K, FYI  

## 2015-12-16 NOTE — Progress Notes (Signed)
ANTICOAGULATION CONSULT NOTE - Follow Up Consult  Pharmacy Consult for warfarin Indication: atrial fibrillation  Allergies  Allergen Reactions  . Penicillins Other (See Comments)    REACTION: feels drunk    Patient Measurements: Height: 5\' 5"  (165.1 cm) Weight: 159 lb 6.3 oz (72.3 kg) IBW/kg (Calculated) : 61.5  Vital Signs: Temp: 99.4 F (37.4 C) (03/31 0400) Temp Source: Axillary (03/31 0400) BP: 80/51 mmHg (03/31 0600) Pulse Rate: 99 (03/31 0739)  Labs:  Recent Labs  12/13/15 1248 12/13/15 1840  12/14/15 0230 12/15/15 0512 12/16/15 0605  HGB  --   --   < > 7.4* 7.9* 7.6*  HCT  --   --   --  22.4* 23.4* 22.5*  PLT  --   --   --  245 230 179  LABPROT  --   --   --  18.8* 16.7* 16.9*  INR  --   --   --  1.57* 1.34 1.36  CREATININE  --   --   --  3.19* 3.70* 4.18*  TROPONINI 0.04* 0.04*  --  0.04*  --   --   < > = values in this interval not displayed.  Estimated Creatinine Clearance: 13.3 mL/min (by C-G formula based on Cr of 4.18).  Assessment: [age] YOM s/p R femoral nailing for subtrochanteric fracture (OR on 3/18) On Warfarin 2.5mg  daily exc 5mg  on Mon and Fri and 3.75mg  on Wed PTA for AFib. Dose with corresponding INR as below. CBC stable with no sxs of bleeding. Remains on IV amiodarone.  Date 3/18 3/19 3/20 3/21 3/22 3/23 3/24 3/25 3/26 3/27 3/28 3/29 3/30 3/31 4/1 4/2 4/3  INR 2.1 2.1 1.77 1.71 2.49 2.63 2.91 3.82 3.61 4.43 6.5 1.57 1.34 1.36     Dose Hold 5mg  5mg  5mg  1 mg 2.5  2.5  Hold Hold Hold Hold Not given  1 mg 2mg      Vit K 1mg           5 mg IV           Goal of Therapy:  INR 2-3   Plan:  1. Continue warfarin at conservative dose; will increase to 2 mg x 1 this evening 2. Monitor daily INR, CBC, s/s of bleed   Pollyann SamplesAndy Benji Poynter, PharmD, BCPS 12/16/2015, 8:15 AM Pager: [phone]        /

## 2015-12-16 NOTE — Telephone Encounter (Signed)
Daughter is calling back to make Dr aware of decision that was made on her father that is in the hospital.

## 2015-12-16 NOTE — Progress Notes (Signed)
eLink Physician-Brief Progress Note Patient Name: Stephen NovelDon S Ratledge DOB: Jul 13, 1941 MRN: [id number]   Date of Service  12/16/2015  HPI/Events of Note  Multiple issues: 1. Agitation - Currently on a Fentanyl IV infusion and Versed PRN and 2. 2. Oliguria - last CVP = 11. LVEF = 65% to 70%.  eICU Interventions  Will order: 1. Increase ceiling on Fentanyl IV infusion to 400 mcg/hour and increase Versed to 1 mg IV Q 1 hour. 2. 0.9 NaCl 500 mL IV over 30 minutes now.      Intervention Category Intermediate Interventions: Oliguria - evaluation and management Minor Interventions: Agitation / anxiety - evaluation and management  [name] 12/16/2015, 5:57 PM

## 2015-12-16 NOTE — Clinical Social Work Note (Signed)
Clinical Social Worker continuing to follow patient and family for support and discharge planning needs.  MD met with patient family at bedside today and family agreed to meet again on Sunday to discuss potential trach placement vs. Withdraw.  CSW remains available for support and will follow up with patient family and MD next week to further discuss patient family options.    Barbette Or, [place]

## 2015-12-16 NOTE — Telephone Encounter (Signed)
Thank you :)

## 2015-12-16 NOTE — Care Management Important Message (Signed)
Important Message  Patient Details  Name: Drue NovelDon S Crall MRN: [id number] Date of Birth: 1941/07/26   Medicare Important Message Given:  Other (see comment)    Emine Lopata Abena 12/16/2015, 11:27 AM

## 2015-12-16 NOTE — Progress Notes (Signed)
PULMONARY / CRITICAL CARE MEDICINE   Name: Stephen [name] MRN: [id number] DOB: 04/13/1941    ADMISSION DATE:  12-20-15 CONSULTATION DATE:  11/30/2015  REFERRING MD:  Dr August Saucer, Ortho  CHIEF COMPLAINT:  Respiratory Distress  HISTORY OF PRESENT ILLNESS:   75 year old man with severe COPD, prednisone and oxygen dependent, atrial fibrillation on Coumadin, hypertension. He was admitted on 2015-12-20 with a right hip fracture after mechanical fall.  He went to the operating room on 3/19 for hip repair. In the PACU he was extubated with some difficulty but then continued to have significant increased work of breathing. PCCM asked to evaluate. Was on BiPAP temporarily and responded well. 3/24 early AM while on floor, developed resp distress again and would not tolerate BiPAP. Moved to ICU for intubation.   SUBJECTIVE:  No events overnight, desaturation noted.  VITAL SIGNS: BP 86/66 mmHg  Pulse 99  Temp(Src) 97.6 F (36.4 C) (Axillary)  Resp 26  Ht  (1.651 m)  Wt 72.3 kg (159 lb 6.3 oz)  BMI 26.52 kg/m2  SpO2 99%  HEMODYNAMICS: CVP:  [8 mmHg-18 mmHg] 11 mmHg  VENTILATOR SETTINGS: Vent Mode:  [-] PRVC FiO2 (%):  [50 %] 50 % Set Rate:  [20 bmp] 20 bmp Vt Set:  [490 mL] 490 mL PEEP:  [8 cmH20] 8 cmH20 Plateau Pressure:  [17 cmH20-23 cmH20] 20 cmH20  INTAKE / OUTPUT: I/O last 3 completed shifts: In: 4896.9 [I.V.:2376.9; NG/GT:2020; IV Piggyback:500] Out: 2720 [Urine:2720]  PHYSICAL EXAMINATION: General:  Sedated. Chronically ill appearing, intermittently agitated.  Swelling Integument:  Warm & dry. No rash on exposed skin.  HEENT:  No scleral injection or icterus. Endotracheal tube in place.  Cardiovascular:  Regular rate. No edema. No appreciable JVD.  Pulmonary:  Diffuse crackles. Abdomen: Soft. Hypoactive bowel sounds. Mild abdominal distention. Neurological: RASS-2 on fent gtt, Not following commands. Pupils equal. Grimaces to pain.9  LABS:  BMET  Recent Labs Lab  12/14/15 0230 12/15/15 0512 12/16/15 0605  NA 147* 149* 146*  K 3.3* 3.1* 3.6  CL 112* 107 105  CO2 BUN 80* 96* 113*  CREATININE 3.19* 3.70* 4.18*  GLUCOSE 145* 171* 161*   Electrolytes  Recent Labs Lab 12/14/15 0230 12/15/15 0512 12/16/15 0605  CALCIUM 7.1* 7.2* 7.3*  MG 2.0 2.2 2.1  PHOS 5.0* 4.8* 3.7   CBC  Recent Labs Lab 12/14/15 0230 12/15/15 0512 12/16/15 0605  WBC 19.9* 25.1* 23.3*  HGB 7.4* 7.9* 7.6*  HCT 22.4* 23.4* 22.5*  PLT 245 230 179   Coag's  Recent Labs Lab 12/14/15 0230 12/15/15 0512 12/16/15 0605  INR 1.57* 1.34 1.36   Sepsis Markers  Recent Labs Lab 12/10/15 0930 12/11/15 0545 12/12/15 0515  PROCALCITON 7.82 5.22 3.57   ABG  Recent Labs Lab 12/14/15 0830 12/15/15 0420 12/16/15 0343  PHART 7.345* 7.353 7.374  PCO2ART 47.4* 52.1* 46.6*  PO2ART 59.7* 77.5* 86.0   Liver Enzymes  Recent Labs Lab 12/13/15 0545 12/14/15 0230 12/15/15 0512 12/16/15 0605  AST 23  --   --   --   ALT 51  --   --   --   ALKPHOS 54  --   --   --   BILITOT 0.9  --   --   --   ALBUMIN 1.7* 1.7* 1.6* 1.6*   Cardiac Enzymes  Recent Labs Lab 12/13/15 1248 12/13/15 1840 12/14/15 0230  TROPONINI 0.04* 0.04* 0.04*   Glucose  Recent Labs Lab 12/15/15 1205 12/15/15  1557 12/15/15 1942 12/15/15 2346 12/16/15 0346 12/16/15 0755  GLUCAP 129* 143* 136* 127* 132* 125*   Imaging Dg Chest Port 1 View  12/16/2015  CLINICAL DATA:  Hypoxia EXAM: PORTABLE CHEST 1 VIEW COMPARISON:  December 15, 2015 chest radiograph; chest CT December 08, 2015 FINDINGS: Endotracheal tube tip is 5.4 cm above the carina. Central catheter tip is in the superior cava. Nasogastric tube tip and side port are below the diaphragm. No pneumothorax. There is underlying emphysematous change. There is diffuse interstitial prominence, largely due to scarring. Mild superimposed edema is question, however. Heart is mildly enlarged with mild pulmonary venous hypertension. There  is a small left pleural effusion. IMPRESSION: Pulmonary vascular congestion superimposed on emphysematous change. There may be a slight degree of interstitial edema as well superimposed. Tube and catheter positions as described without pneumothorax. Appearance is stable compared to 1 day prior. Electronically Signed   By: Bretta Bang III M.D.   On: 12/16/2015 07:49   STUDIES:  CT chest 3/23 no mass, pna  MICROBIOLOGY: MRSA PCR 3/19:  Negative Sputum 7/28>>>SERRATIA MARCESCENS  ANTIBIOTICS: Clindamycin post procedure 3/27 cefepime >> 3/28 vanc >>  SIGNIFICANT EVENTS: 3/19 - R Hip Repair 3/27 increased FIO2/PEEP, bicarb gtt for acidosis  LINES/TUBES: OETT 7.5 3/24>>> L IJ CVL 3/24>>> OGT 3/24>>> Foley 3/24>>> PIV  ASSESSMENT / PLAN:  PULMONARY A: Acute Hypoxic Respiratory Failure H/O Severe COPD ARDS 3/27 ? New HCAP P:   Xopenex neb q6 hr. Continue Solu-Medrol IV to q12 hr. O2 sat goal 88-92%. Diureses as below.  CARDIOVASCULAR A:  Hypotension - Resolved 3/24 but back on pressors 3/26? Secondary to sedation Paroxysmal Atrial Fibrillation - RVR resolved after intubation but back 3/26 H/O HTN  P:  Levophed for MAP goal > Cardiology following Changed PO to amiodarone gtt Echo with EF of 65-70%.  RENAL A:   Acute on Chronic Renal Failure  H/O BPH Metab acidosis P:   Dc IVFs Continue Lasix drip at 10 mg/hr. Trending UOP with foley. Monitoring electrolytes & renal function daily. Replace electrolytes as indicated. Continue Flomax. Zaroxolyn 10 mg x1.  GASTROINTESTINAL A:   Umbilical hernia  P:   Planned follow-up with [facility] as an outpatient Tube feedings @ goal  HEMATOLOGIC A:   ABLA s/p surgery - Hgb Stable. Leukocytosis Coagulopathy - Chronic anticoagulation w/ Coumadin for a-fib.  P:  Transfuse for Hgb <7.0 or active bleeding Coumadin per pharmacy on hold  INFECTIOUS A:   HCAP 3/27 with SERRATIA MARCESCENS  P:   Monitor for  fever Procalcitonin slight high - empiric cefepime/ vanc to cover HCAP  ENDOCRINE A:   Hypothyroidism - TSH low. Hyperglycemia - Likely secondary to steroids.  P:   Continue synthroid 75 mcg PO daily. Accu-checks q4hr. Low dose SSI per algorithm.  NEUROLOGIC A:   Pain Control - Post hip surgery 3/19. Sedation on Ventilator   P:   RASS goal: 0 to -1. PRN Versed. Fentanyl gtt.  FAMILY UPDATES:  Family updated bedside, after discussion, will make LCB with no CPR/cardioversion and discussed trach/peg, will meet again on Sunday.  The patient is critically ill with multiple organ systems failure and requires high complexity decision making for assessment and support, frequent evaluation and titration of therapies, application of advanced monitoring technologies and extensive interpretation of multiple databases.   Critical Care Time devoted to patient care services described in this note is  35  Minutes. This time reflects time of care of this signee Dr Koren Bound.  This critical care time does not reflect procedure time, or teaching time or supervisory time of PA/NP/Med student/Med Resident etc but could involve care discussion time.  Alyson ReedyWesam G. [name], M.D. [facility] Pulmonary/Critical Care Medicine. Pager: [phone]. After hours pager: [phone].  12/16/2015

## 2015-12-17 ENCOUNTER — Inpatient Hospital Stay (HOSPITAL_COMMUNITY): Payer: PPO

## 2015-12-17 DIAGNOSIS — R601 Generalized edema: Secondary | ICD-10-CM

## 2015-12-17 LAB — CBC WITH DIFFERENTIAL/PLATELET
BASOS PCT: 0 %
Basophils Absolute: 0 10*3/uL (ref 0.0–0.1)
EOS PCT: 0 %
Eosinophils Absolute: 0 10*3/uL (ref 0.0–0.7)
HEMATOCRIT: 24.1 % — AB (ref 39.0–52.0)
HEMOGLOBIN: 8 g/dL — AB (ref 13.0–17.0)
LYMPHS ABS: 0.6 10*3/uL — AB (ref 0.7–4.0)
Lymphocytes Relative: 2 %
MCH: 30 pg (ref 26.0–34.0)
MCHC: 33.2 g/dL (ref 30.0–36.0)
MCV: 90.3 fL (ref 78.0–100.0)
MONO ABS: 0.3 10*3/uL (ref 0.1–1.0)
MONOS PCT: 1 %
NEUTROS ABS: 30.1 10*3/uL — AB (ref 1.7–7.7)
Neutrophils Relative %: 97 %
Platelets: 185 10*3/uL (ref 150–400)
RBC: 2.67 MIL/uL — AB (ref 4.22–5.81)
RDW: 16.9 % — AB (ref 11.5–15.5)
WBC: 31 10*3/uL — AB (ref 4.0–10.5)

## 2015-12-17 LAB — RENAL FUNCTION PANEL
Albumin: 1.7 g/dL — ABNORMAL LOW (ref 3.5–5.0)
Anion gap: 13 (ref 5–15)
BUN: 128 mg/dL — AB (ref 6–20)
CALCIUM: 7.5 mg/dL — AB (ref 8.9–10.3)
CO2: 28 mmol/L (ref 22–32)
CREATININE: 4.46 mg/dL — AB (ref 0.61–1.24)
Chloride: 103 mmol/L (ref 101–111)
GFR, EST AFRICAN AMERICAN: 14 mL/min — AB (ref >60)
GFR, EST NON AFRICAN AMERICAN: 12 mL/min — AB (ref >60)
Glucose, Bld: 167 mg/dL — ABNORMAL HIGH (ref 65–99)
Phosphorus: 4.2 mg/dL (ref 2.5–4.6)
Potassium: 3.6 mmol/L (ref 3.5–5.1)
SODIUM: 144 mmol/L (ref 135–145)

## 2015-12-17 LAB — BLOOD GAS, ARTERIAL
ACID-BASE EXCESS: 2.8 mmol/L — AB (ref 0.0–2.0)
Bicarbonate: 27.6 mEq/L — ABNORMAL HIGH (ref 20.0–24.0)
DRAWN BY: 23604
FIO2: 0.4
MECHVT: 490 mL
O2 Saturation: 95 %
PATIENT TEMPERATURE: 98.6
PCO2 ART: 48.9 mmHg — AB (ref 35.0–45.0)
PEEP/CPAP: 8 cmH2O
RATE: 20 resp/min
TCO2: 29.1 mmol/L (ref 0–100)
pH, Arterial: 7.37 (ref 7.350–7.450)
pO2, Arterial: 79.9 mmHg — ABNORMAL LOW (ref 80.0–100.0)

## 2015-12-17 LAB — MAGNESIUM: Magnesium: 2.1 mg/dL (ref 1.7–2.4)

## 2015-12-17 LAB — BASIC METABOLIC PANEL
Anion gap: 14 (ref 5–15)
BUN: 126 mg/dL — AB (ref 6–20)
CALCIUM: 7.6 mg/dL — AB (ref 8.9–10.3)
CHLORIDE: 102 mmol/L (ref 101–111)
CO2: 27 mmol/L (ref 22–32)
CREATININE: 4.45 mg/dL — AB (ref 0.61–1.24)
GFR calc Af Amer: 14 mL/min — ABNORMAL LOW (ref >60)
GFR calc non Af Amer: 12 mL/min — ABNORMAL LOW (ref >60)
GLUCOSE: 164 mg/dL — AB (ref 65–99)
Potassium: 3.6 mmol/L (ref 3.5–5.1)
Sodium: 143 mmol/L (ref 135–145)

## 2015-12-17 LAB — GLUCOSE, CAPILLARY
GLUCOSE-CAPILLARY: 122 mg/dL — AB (ref 65–99)
GLUCOSE-CAPILLARY: 133 mg/dL — AB (ref 65–99)
GLUCOSE-CAPILLARY: 135 mg/dL — AB (ref 65–99)
Glucose-Capillary: 152 mg/dL — ABNORMAL HIGH (ref 65–99)
Glucose-Capillary: 144 mg/dL — ABNORMAL HIGH (ref 65–99)

## 2015-12-17 LAB — PROTIME-INR
INR: 1.25 (ref 0.00–1.49)
Prothrombin Time: 15.8 seconds — ABNORMAL HIGH (ref 11.6–15.2)

## 2015-12-17 LAB — PHOSPHORUS: Phosphorus: 4.2 mg/dL (ref 2.5–4.6)

## 2015-12-17 MED ORDER — ALBUMIN HUMAN 25 % IV SOLN
25.0000 g | Freq: Four times a day (QID) | INTRAVENOUS | Status: AC
  Administered 2015-12-17 – 2015-12-18 (×4): 25 g via INTRAVENOUS
  Filled 2015-12-17 (×2): qty 50
  Filled 2015-12-17 (×3): qty 100

## 2015-12-17 MED ORDER — FUROSEMIDE 10 MG/ML IJ SOLN
10.0000 mg/h | INTRAVENOUS | Status: AC
  Administered 2015-12-17: 10 mg/h via INTRAVENOUS
  Filled 2015-12-17: qty 25

## 2015-12-17 MED ORDER — FUROSEMIDE 10 MG/ML IJ SOLN: 10.0000 mg/h | INTRAVENOUS | Status: DC

## 2015-12-17 MED ORDER — WARFARIN SODIUM 2.5 MG PO TABS
2.5000 mg | ORAL_TABLET | Freq: Once | ORAL | Status: AC
  Administered 2015-12-17: 2.5 mg via ORAL
  Filled 2015-12-17: qty 1

## 2015-12-17 MED ORDER — POTASSIUM CHLORIDE 20 MEQ/15ML (10%) PO SOLN
40.0000 meq | Freq: Once | ORAL | Status: AC
  Administered 2015-12-17: 40 meq
  Filled 2015-12-17: qty 30

## 2015-12-17 MED ORDER — METOLAZONE 10 MG PO TABS
10.0000 mg | ORAL_TABLET | Freq: Once | ORAL | Status: AC
  Administered 2015-12-17: 10 mg via ORAL
  Filled 2015-12-17: qty 1

## 2015-12-17 NOTE — Progress Notes (Signed)
ANTICOAGULATION CONSULT NOTE - Follow Up Consult  Pharmacy Consult for warfarin Indication: atrial fibrillation  Allergies  Allergen Reactions  . Penicillins Other (See Comments)    REACTION: feels drunk    Patient Measurements: Height: 5\' 5"  (165.1 cm) Weight: 154 lb 12.2 oz (70.2 kg) IBW/kg (Calculated) : 61.5  Vital Signs: Temp: 98.4 F (36.9 C) (04/01 1155) Temp Source: Axillary (04/01 1155) BP: 134/68 mmHg (04/01 1230) Pulse Rate: 99 (04/01 1230)  Labs:  Recent Labs  12/15/15 0512 12/16/15 0605 12/17/15 0420 12/17/15 0438  HGB 7.9* 7.6*  --  8.0*  HCT 23.4* 22.5*  --  24.1*  PLT 230 179  --  185  LABPROT 16.7* 16.9*  --  15.8*  INR 1.34 1.36  --  1.25  CREATININE 3.70* 4.18* 4.46* 4.45*    Estimated Creatinine Clearance: 12.5 mL/min (by C-G formula based on Cr of 4.45).  Assessment: [age] YOM s/p R femoral nailing for subtrochanteric fracture (OR on 3/18) On Warfarin 2.5mg  daily exc 5mg  on Mon and Fri and 3.75mg  on Wed PTA for AFib. Dose with corresponding INR as below. CBC stable with no sxs of bleeding. Remains on IV amiodarone.  Date 3/18 3/19 3/20 3/21 3/22 3/23 3/24 3/25 3/26 3/27 3/28 3/29 3/30 3/31 4/1 4/2 4/3  INR 2.1 2.1 1.77 1.71 2.49 2.63 2.91 3.82 3.61 4.43 6.5 1.57 1.34 1.36 1.25    Dose Hold 5mg  5mg  5mg  1 mg 2.5  2.5  Hold Hold Hold Hold Not given  1 mg 2mg  2.5    Vit K 1mg           5 mg IV         Goal of Therapy:  INR 2-3   Plan:  - Warfarin 2.5mg  PO x 1 tonight - Daily INR  Lysle Pearlachel Guadalupe Nickless, PharmD, BCPS Pager # [phone] 12/17/2015 1:11 PM

## 2015-12-17 NOTE — Progress Notes (Signed)
PULMONARY / CRITICAL CARE MEDICINE   Name: Stephen [name] MRN: [id number] DOB: November 30, 1940    ADMISSION DATE:  11/27/2015 CONSULTATION DATE:  2015-12-05  REFERRING MD:  Dr August Saucer, Ortho  CHIEF COMPLAINT:  Respiratory Distress  HISTORY OF PRESENT ILLNESS:   75 year old man with severe COPD, prednisone and oxygen dependent, atrial fibrillation on Coumadin, hypertension. He was admitted on 11/28/2015 with a right hip fracture after mechanical fall.  He went to the operating room on 3/19 for hip repair. In the PACU he was extubated with some difficulty but then continued to have significant increased work of breathing. PCCM asked to evaluate. Was on BiPAP temporarily and responded well. 3/24 early AM while on floor, developed resp distress again and would not tolerate BiPAP. Moved to ICU for intubation.   SUBJECTIVE:  No events overnight, worsening hypotension.  VITAL SIGNS: BP 133/88 mmHg  Pulse 106  Temp(Src) 99.4 F (37.4 C) (Axillary)  Resp 22  Ht  (1.651 m)  Wt 70.2 kg (154 lb 12.2 oz)  BMI 25.75 kg/m2  SpO2 100%  HEMODYNAMICS: CVP:  [10 mmHg-17 mmHg] 10 mmHg  VENTILATOR SETTINGS: Vent Mode:  [-] PRVC FiO2 (%):  [40 %-50 %] 40 % Set Rate:  [20 bmp] 20 bmp Vt Set:  [490 mL] 490 mL PEEP:  [5 cmH20-8 cmH20] 8 cmH20 Plateau Pressure:  [11 cmH20-24 cmH20] 20 cmH20  INTAKE / OUTPUT: I/O last 3 completed shifts: In: 4216.6 [I.V.:2191.6; NG/GT:1975; IV Piggyback:50] Out: 2675 [Urine:2675]  PHYSICAL EXAMINATION: General:  Sedated. Chronically ill appearing, intermittently agitated.  Swelling Integument:  Warm & dry. No rash on exposed skin.  HEENT:  No scleral injection or icterus. Endotracheal tube in place.  Cardiovascular:  Regular rate. No edema. No appreciable JVD.  Pulmonary:  Diffuse crackles. Abdomen: Soft. Hypoactive bowel sounds. Mild abdominal distention. Neurological: RASS-2 on fent gtt, Not following commands. Pupils equal. Grimaces to  pain.9  LABS:  BMET  Recent Labs Lab 12/16/15 0605 12/17/15 0420 12/17/15 0438  NA 146* 144 143  K 3.6 3.6 3.6  CL 105 103 102  CO2 BUN 113* 128* 126*  CREATININE 4.18* 4.46* 4.45*  GLUCOSE 161* 167* 164*   Electrolytes  Recent Labs Lab 12/15/15 0512 12/16/15 0605 12/17/15 0420 12/17/15 0438  CALCIUM 7.2* 7.3* 7.5* 7.6*  MG 2.2 2.1  --  2.1  PHOS 4.8* 3.7 4.2 4.2   CBC  Recent Labs Lab 12/15/15 0512 12/16/15 0605 12/17/15 0438  WBC 25.1* 23.3* 31.0*  HGB 7.9* 7.6* 8.0*  HCT 23.4* 22.5* 24.1*  PLT 230 179 185   Coag's  Recent Labs Lab 12/15/15 0512 12/16/15 0605 12/17/15 0438  INR 1.34 1.36 1.25   Sepsis Markers  Recent Labs Lab 12/10/15 0930 12/11/15 0545 12/12/15 0515  PROCALCITON 7.82 5.22 3.57   ABG  Recent Labs Lab 12/15/15 0420 12/16/15 0343 12/17/15 0337  PHART 7.353 7.374 7.370  PCO2ART 52.1* 46.6* 48.9*  PO2ART 77.5* 86.0 79.9*   Liver Enzymes  Recent Labs Lab 12/13/15 0545  12/15/15 0512 12/16/15 0605 12/17/15 0420  AST 23  --   --   --   --   ALT 51  --   --   --   --   ALKPHOS 54  --   --   --   --   BILITOT 0.9  --   --   --   --   ALBUMIN 1.7*  < > 1.6* 1.6* 1.7*  < > =  values in this interval not displayed. Cardiac Enzymes  Recent Labs Lab 12/13/15 1248 12/13/15 1840 12/14/15 0230  TROPONINI 0.04* 0.04* 0.04*   Glucose  Recent Labs Lab 12/16/15 1129 12/16/15 1513 12/16/15 1945 12/16/15 2335 12/17/15 0320 12/17/15 0732  GLUCAP 128* 141* 154* 133* 135* 152*   Imaging Dg Chest Port 1 View  12/17/2015  CLINICAL DATA:  Acute respiratory failure. On ventilator. Hypoxemia. Septic shock. Atrial fibrillation. COPD. EXAM: PORTABLE CHEST 1 VIEW COMPARISON:  12/16/2015 FINDINGS: Endotracheal tube, left-sided central venous catheter and nasogastric tube remain in appropriate position. Pulmonary emphysema again noted. Superimposed airspace disease in the lower lung zones shows no significant change,  and could be due to superimposed edema or infection. No pneumothorax visualized. IMPRESSION: No significant change in bilateral lower lung airspace disease superimposed on pulmonary emphysema. Electronically Signed   By: Myles Rosenthal M.D.   On: 12/17/2015 07:27   STUDIES:  CT chest 3/23 no mass, pna  MICROBIOLOGY: MRSA PCR 3/19:  Negative Sputum 7/28>>>SERRATIA MARCESCENS  ANTIBIOTICS: Clindamycin post procedure 3/27 cefepime >> 3/28 vanc >>  SIGNIFICANT EVENTS: 3/19 - R Hip Repair 3/27 increased FIO2/PEEP, bicarb gtt for acidosis  LINES/TUBES: OETT 7.5 3/24>>> L IJ CVL 3/24>>> OGT 3/24>>> Foley 3/24>>> PIV  ASSESSMENT / PLAN:  PULMONARY A: Acute Hypoxic Respiratory Failure H/O Severe COPD ARDS 3/27 ? New HCAP P:   Xopenex neb q6 hr. Continue Solu-Medrol IV to q12 hr. O2 sat goal 88-92%. Diureses as below.  CARDIOVASCULAR A:  Hypotension - Resolved 3/24 but back on pressors 3/26? Secondary to sedation Paroxysmal Atrial Fibrillation - RVR resolved after intubation but back 3/26 H/O HTN  P:  Levophed for MAP goal > Cardiology following Changed PO to amiodarone gtt Echo with EF of 65-70%.  RENAL A:   Acute on Chronic Renal Failure  H/O BPH Metab acidosis P:   Dc IVFs Continue Lasix drip at 10 mg/hr. Trending UOP with foley. Monitoring electrolytes & renal function daily. Replace electrolytes as indicated. Continue Flomax. Zaroxolyn 10 mg x1. Albumin ordered.  GASTROINTESTINAL A:   Umbilical hernia  P:   Planned follow-up with [facility] as an outpatient Tube feedings @ goal  HEMATOLOGIC A:   ABLA s/p surgery - Hgb Stable. Leukocytosis Coagulopathy - Chronic anticoagulation w/ Coumadin for a-fib.  P:  Transfuse for Hgb <7.0 or active bleeding Coumadin per pharmacy on hold  INFECTIOUS A:   HCAP 3/27 with SERRATIA MARCESCENS  P:   Monitor for fever Procalcitonin slight high - empiric cefepime/ vanc to cover HCAP  ENDOCRINE A:    Hypothyroidism - TSH low. Hyperglycemia - Likely secondary to steroids.  P:   Continue synthroid 75 mcg PO daily. Accu-checks q4hr. Low dose SSI per algorithm.  NEUROLOGIC A:   Pain Control - Post hip surgery 3/19. Sedation on Ventilator   P:   RASS goal: 0 to -1. PRN Versed. Fentanyl gtt.  FAMILY UPDATES:  Spoke with daughter, no trach/peg/dialysis, will continue support for now, meet with family again tomorrow.  The patient is critically ill with multiple organ systems failure and requires high complexity decision making for assessment and support, frequent evaluation and titration of therapies, application of advanced monitoring technologies and extensive interpretation of multiple databases.   Critical Care Time devoted to patient care services described in this note is  35  Minutes. This time reflects time of care of this signee Dr Koren Bound. This critical care time does not reflect procedure time, or teaching time or supervisory time  of PA/NP/Med student/Med Resident etc but could involve care discussion time.  Alyson ReedyWesam G. Veverly Larimer, M.D. [facility] Pulmonary/Critical Care Medicine. Pager: [phone]. After hours pager: [phone].  12/17/2015

## 2015-12-17 NOTE — Progress Notes (Signed)
    Subjective:  Intubated and sedatd   Objective:  Filed Vitals:   12/17/15 1155 12/17/15 1200 12/17/15 1215 12/17/15 1230  BP:  91/57 109/59 134/68  Pulse:  88 96 99  Temp: 98.4 F (36.9 C)     TempSrc: Axillary     Resp:  21 21 21   Height:      Weight:      SpO2:  99% 100% 99%    Intake/Output from previous day:  Intake/Output Summary (Last 24 hours) at 12/17/15 1239 Last data filed at 12/17/15 1200  Gross per 24 hour  Intake 3044.44 ml  Output   1555 ml  Net 1489.44 ml    Physical Exam: Physical exam: Well-developed well-nourished; intubated and sedated Skin is warm and dry.  HEENT is normal.  Neck is supple. Chest is clear to auscultation with normal expansion.  Cardiovascular exam is irregular Abdominal exam mildly distended. No masses palpated. Extremities show 2+ edema. neuro not assessed as pt sedated and intubated.    Lab Results: Basic Metabolic Panel:  Recent Labs  16/06/9602/31/17 0605 12/17/15 0420 12/17/15 0438  NA 146* 144 143  K 3.6 3.6 3.6  CL 105 103 102  CO2 30 28 27   GLUCOSE 161* 167* 164*  BUN 113* 128* 126*  CREATININE 4.18* 4.46* 4.45*  CALCIUM 7.3* 7.5* 7.6*  MG 2.1  --  2.1  PHOS 3.7 4.2 4.2   CBC:  Recent Labs  12/16/15 0605 12/17/15 0438  WBC 23.3* 31.0*  NEUTROABS 22.0* 30.1*  HGB 7.6* 8.0*  HCT 22.5* 24.1*  MCV 91.1 90.3  PLT 179 185     Assessment/Plan:  1 atrial fibrillation-rate is controlled. Continue amiodarone and Cardizem. Continue Coumadin. 2 VDRF-management per CCM 3 Acute diastolic CHF-remains volume overloaded; continue lasix gtt; agree with metolazone. 4 Acute on chronic kidney failure-per CCM, no dialysis. 5 s/p hip surgery.   Olga MillersBrian Anani Gu 12/17/2015, 12:39 PM

## 2015-12-17 NOTE — Progress Notes (Signed)
New ecchymyosis noted on the patient's right lower leg, extending from previously present hip bruising. Pulses and leg circumference equal when compared to left leg. Reported to day shift for rounding physician to assess.

## 2015-12-17 DEATH — deceased

## 2015-12-18 ENCOUNTER — Inpatient Hospital Stay (HOSPITAL_COMMUNITY): Payer: PPO

## 2015-12-18 LAB — CBC WITH DIFFERENTIAL/PLATELET
Basophils Absolute: 0 10*3/uL (ref 0.0–0.1)
Basophils Relative: 0 %
Eosinophils Absolute: 0 10*3/uL (ref 0.0–0.7)
Eosinophils Relative: 0 %
HEMATOCRIT: 20 % — AB (ref 39.0–52.0)
HEMOGLOBIN: 6.6 g/dL — AB (ref 13.0–17.0)
LYMPHS ABS: 0.5 10*3/uL — AB (ref 0.7–4.0)
LYMPHS PCT: 2 %
MCH: 29.6 pg (ref 26.0–34.0)
MCHC: 33 g/dL (ref 30.0–36.0)
MCV: 89.7 fL (ref 78.0–100.0)
MONO ABS: 0.2 10*3/uL (ref 0.1–1.0)
MONOS PCT: 1 %
NEUTROS ABS: 18.6 10*3/uL — AB (ref 1.7–7.7)
NEUTROS PCT: 96 %
Platelets: 137 10*3/uL — ABNORMAL LOW (ref 150–400)
RBC: 2.23 MIL/uL — ABNORMAL LOW (ref 4.22–5.81)
RDW: 16.5 % — AB (ref 11.5–15.5)
WBC: 19.3 10*3/uL — ABNORMAL HIGH (ref 4.0–10.5)

## 2015-12-18 LAB — PHOSPHORUS: Phosphorus: 4.1 mg/dL (ref 2.5–4.6)

## 2015-12-18 LAB — GLUCOSE, CAPILLARY
GLUCOSE-CAPILLARY: 155 mg/dL — AB (ref 65–99)
GLUCOSE-CAPILLARY: 105 mg/dL — AB (ref 65–99)
GLUCOSE-CAPILLARY: 126 mg/dL — AB (ref 65–99)
Glucose-Capillary: 164 mg/dL — ABNORMAL HIGH (ref 65–99)

## 2015-12-18 LAB — BASIC METABOLIC PANEL
Anion gap: 16 — ABNORMAL HIGH (ref 5–15)
BUN: 142 mg/dL — ABNORMAL HIGH (ref 6–20)
CALCIUM: 7.9 mg/dL — AB (ref 8.9–10.3)
CHLORIDE: 101 mmol/L (ref 101–111)
CO2: 27 mmol/L (ref 22–32)
CREATININE: 4.75 mg/dL — AB (ref 0.61–1.24)
GFR calc non Af Amer: 11 mL/min — ABNORMAL LOW (ref >60)
GFR, EST AFRICAN AMERICAN: 13 mL/min — AB (ref >60)
GLUCOSE: 134 mg/dL — AB (ref 65–99)
Potassium: 3.8 mmol/L (ref 3.5–5.1)
Sodium: 144 mmol/L (ref 135–145)

## 2015-12-18 LAB — MAGNESIUM: Magnesium: 2 mg/dL (ref 1.7–2.4)

## 2015-12-18 LAB — PROTIME-INR
INR: 1.5 — ABNORMAL HIGH (ref 0.00–1.49)
Prothrombin Time: 18.2 seconds — ABNORMAL HIGH (ref 11.6–15.2)

## 2015-12-18 MED ORDER — LORAZEPAM BOLUS VIA INFUSION
2.0000 mg | INTRAVENOUS | Status: DC | PRN
  Filled 2015-12-18: qty 5

## 2015-12-18 MED ORDER — MORPHINE SULFATE (PF) 4 MG/ML IV SOLN
5.0000 mg | INTRAVENOUS | Status: DC | PRN
  Filled 2015-12-18: qty 2

## 2015-12-18 MED ORDER — LORAZEPAM 2 MG/ML IJ SOLN
5.0000 mg/h | INTRAMUSCULAR | Status: DC
  Administered 2015-12-18: 5 mg/h via INTRAVENOUS
  Filled 2015-12-18: qty 25

## 2015-12-18 MED ORDER — FENTANYL BOLUS VIA INFUSION
50.0000 ug | INTRAVENOUS | Status: DC | PRN
  Filled 2015-12-18: qty 200

## 2015-12-18 MED ORDER — SCOPOLAMINE 1 MG/3DAYS TD PT72
1.0000 | MEDICATED_PATCH | TRANSDERMAL | Status: DC
  Administered 2015-12-18: 1.5 mg via TRANSDERMAL
  Filled 2015-12-18: qty 1

## 2015-12-18 MED ORDER — SODIUM CHLORIDE 0.9 % IV SOLN
100.0000 ug/h | INTRAVENOUS | Status: DC
  Administered 2015-12-18: 400 ug/h via INTRAVENOUS
  Filled 2015-12-18: qty 50

## 2015-12-19 ENCOUNTER — Encounter: Payer: Self-pay | Admitting: Internal Medicine

## 2015-12-21 ENCOUNTER — Other Ambulatory Visit: Payer: Self-pay | Admitting: Oncology

## 2016-01-16 NOTE — Progress Notes (Signed)
The family has made the decision to withdraw care and provide comfort care.  CCM Md alerted to this. The family is wanting to proceed with terminal extubation upon arrival of more family members. A comfort cart was ordered, CSW and Chaplin were called and came to the bedside to help support the situation. The process of what to expect was explained thoroughly and  ample time was given by the Md and RN for the family to ask questions. All questions were answered.

## 2016-01-16 NOTE — Progress Notes (Signed)
All family has assembled in the room and is ready for extubation. Chaplain is at bedside along with RN. RT has been called and will arrive soon to extubate.

## 2016-01-16 NOTE — Social Work (Signed)
CSW consulted by daughter for additional support. CSW met with daughter Jermain Curt. Ms. Bartholomew Boards stated that she had some questions about preparing for patient's transition. CSW provided patient with information with numbers to contact in order to make appropriate arrangements. Ms. Bartholomew Boards identified that she appreciated the [place] visit. No further needs at this time.  Christene Lye MSW, [place]

## 2016-01-16 NOTE — Discharge Summary (Signed)
Stephen Dies:  [name], Stephen [name]              ACCOUNT NO.:  [id number]  MEDICAL RECORD NO.:  [id number]  LOCATION:  3M08C                        FACILITY:  [facility]  PHYSICIAN:  Felipa EvenerWesam Jake Milia Warth, MD  DATE OF BIRTH:  November 13, 1940  DATE OF ADMISSION:  12/16/2015 DATE OF DISCHARGE:  01/09/2016                              DISCHARGE SUMMARY   PRIMARY DIAGNOSIS/CAUSE OF DEATH:  Severe chronic obstructive pulmonary disease.  SECONDARY DIAGNOSES: 1. Acute respiratory failure. 2. Hypoxemic respiratory failure. 3. Acute respiratory distress syndrome. 4. Paroxysmal atrial fibrillation with rapid ventricular response. 5. Hypotension. 6. Acute on chronic renal failure. 7. Metabolic acidosis. 8. Leukocytosis, coagulopathy due to Coumadin. 9. Serratia marcescens healthcare-acquired pneumonia. 10.Hypothyroidism. 11.Hyperglycemia.  HOSPITAL COURSE:  The patient is a 75 year old male with a past medical history significant for COPD and atrial fibrillation.  He was suffered a traumatic hip fracture, was brought to the operating room by Orthopedics for repair. Postop, the patient was extubated.  However, continued to have respiratory distress, was started on BiPAP subsequently improved his symptoms.  However, __________ developed another episode of respiratory failure, failed BiPAP at that point, was transferred to intensive care unit for intubation.  Patient was intubated on the ventilator from 3/24 to 4/02.  The patient failed to show any significant signs of improvement actually started having multiorgan dysfunction including renal failure.  I had an extensive conversation with the family, informed that at this point, his only chance of giving him more time for recovery, __________ tracheostomy and feeding tube and a prolonged rehab process.  __________ me that this would not be in accordance with the patient's wishes, therefore after discussion decided to make the patient no code blue with comfort care.   Morphine was started along with Versed and the patient was comfortable and was extubated, expired shortly thereafter.  The family at the beside.     Felipa EvenerWesam Jake [name], MD     WJY/MEDQ  D:  12/20/2015  T:  12/21/2015  Job:  [id number]

## 2016-01-16 NOTE — Progress Notes (Addendum)
The pt passed peacefully at [id number] with family and RN at bedside. Pt was pronounced by Docia BarrierJosh Javone Ybanez, RN and Val EagleMeghan Bergman, RN. All belongings were returned to the daughter. The family was very appreciative of the care there loved one received from the [facility] staff and physicians. E-link, Dr. Molli KnockYacoub, ME, and CDS notified of the pt passing. The Pt is an ME case and all directions from the ME were followed. Wasted 28 ml of ativan, 215 ml fentanyl and 8 mg of morphine in the sink. Witnessed by Docia BarrierJosh Uri Covey, RN, Sullivan LoneJamie White, RN and Serena ColonelYeni Pineda, RN.

## 2016-01-16 NOTE — Procedures (Signed)
Extubation Procedure Note  Patient Details:   Name: Stephen [name] DOB: 11-Sep-1941 MRN: [id number]     Pt terminally extubated at this time per MD order.   Stephen [name], Stephen [name] 01/02/2016, 2:24 PM

## 2016-01-16 NOTE — Progress Notes (Signed)
Pt terminally extubated by RT per Md order. RN at bedside to assist. ETT was removed and pt remained comfortable before during and after. Will continue to monitor.

## 2016-01-16 NOTE — Progress Notes (Signed)
PULMONARY / CRITICAL CARE MEDICINE   Name: Stephen [name] MRN: [id number] DOB: October 29, 1940    ADMISSION DATE:  12/01/2015 CONSULTATION DATE:  11/27/2015  REFERRING MD:  Dr August Saucer, Ortho  CHIEF COMPLAINT:  Respiratory Distress  HISTORY OF PRESENT ILLNESS:   75 year old man with severe COPD, prednisone and oxygen dependent, atrial fibrillation on Coumadin, hypertension. He was admitted on 12/02/2015 with a right hip fracture after mechanical fall.  He went to the operating room on 3/19 for hip repair. In the PACU he was extubated with some difficulty but then continued to have significant increased work of breathing. PCCM asked to evaluate. Was on BiPAP temporarily and responded well. 3/24 early AM while on floor, developed resp distress again and would not tolerate BiPAP. Moved to ICU for intubation.   SUBJECTIVE:  Not weaning well, will terminally extubate today per family's wishes.  VITAL SIGNS: BP 131/66 mmHg  Pulse 80  Temp(Src) 97.4 F (36.3 C) (Axillary)  Resp 22  Ht  (1.651 m)  Wt 72.7 kg (160 lb 4.4 oz)  BMI 26.67 kg/m2  SpO2 100%  HEMODYNAMICS: CVP:  [10 mmHg-19 mmHg] 10 mmHg  VENTILATOR SETTINGS: Vent Mode:  [-] PRVC FiO2 (%):  [40 %] 40 % Set Rate:  [20 bmp] 20 bmp Vt Set:  [490 mL] 490 mL PEEP:  [8 cmH20] 8 cmH20 Plateau Pressure:  [17 cmH20-24 cmH20] 21 cmH20  INTAKE / OUTPUT: I/O last 3 completed shifts: In: 59 [I.V.:2703; NG/GT:2110; IV Piggyback:450] Out: 1848 [Urine:1848]  PHYSICAL EXAMINATION: General:  Sedated. Chronically ill appearing, intermittently agitated.  Swelling Integument:  Warm & dry. No rash on exposed skin.  HEENT:  No scleral injection or icterus. Endotracheal tube in place.  Cardiovascular:  Regular rate. No edema. No appreciable JVD.  Pulmonary:  Diffuse crackles. Abdomen: Soft. Hypoactive bowel sounds. Mild abdominal distention. Neurological: RASS-2 on fent gtt, Not following commands. Pupils equal. Grimaces to  pain.9  LABS:  BMET  Recent Labs Lab 12/17/15 0420 12/17/15 0438 01-07-16 0540  NA 144 143 144  K 3.6 3.6 3.8  CL 103 102 101  CO2 BUN 128* 126* 142*  CREATININE 4.46* 4.45* 4.75*  GLUCOSE 167* 164* 134*   Electrolytes  Recent Labs Lab 12/16/15 0605 12/17/15 0420 12/17/15 0438 2016/01/07 0540  CALCIUM 7.3* 7.5* 7.6* 7.9*  MG 2.1  --  2.1 2.0  PHOS 3.7 4.2 4.2 4.1   CBC  Recent Labs Lab 12/16/15 0605 12/17/15 0438 01/07/16 0540  WBC 23.3* 31.0* 19.3*  HGB 7.6* 8.0* 6.6*  HCT 22.5* 24.1* 20.0*  PLT 179 185 137*   Coag's  Recent Labs Lab 12/16/15 0605 12/17/15 0438 2016/01/07 0540  INR 1.36 1.25 1.50*   Sepsis Markers  Recent Labs Lab 12/12/15 0515  PROCALCITON 3.57   ABG  Recent Labs Lab 12/15/15 0420 12/16/15 0343 12/17/15 0337  PHART 7.353 7.374 7.370  PCO2ART 52.1* 46.6* 48.9*  PO2ART 77.5* 86.0 79.9*   Liver Enzymes  Recent Labs Lab 12/13/15 0545  12/15/15 0512 12/16/15 0605 12/17/15 0420  AST 23  --   --   --   --   ALT 51  --   --   --   --   ALKPHOS 54  --   --   --   --   BILITOT 0.9  --   --   --   --   ALBUMIN 1.7*  < > 1.6* 1.6* 1.7*  < > = values in this  interval not displayed. Cardiac Enzymes  Recent Labs Lab 12/13/15 1248 12/13/15 1840 12/14/15 0230  TROPONINI 0.04* 0.04* 0.04*   Glucose  Recent Labs Lab 12/17/15 1121 12/17/15 1538 12/17/15 1950 12/17/15 2325 12/22/2015 0341 01/08/2016 0738  GLUCAP 122* 144* 155* 105* 164* 126*   Imaging Dg Chest Port 1 View  12/25/2015  CLINICAL DATA:  Respiratory failure. EXAM: PORTABLE CHEST 1 VIEW COMPARISON:  12/17/2015 and prior exams FINDINGS: An endotracheal tube with tip 4 cm above the carina, NG tube entering the stomach, and left central venous catheter with tip overlying the mid SVC again noted. Right mid and lower lung airspace opacities and left lower lung atelectasis/ consolidation again noted. There is no evidence of pneumothorax. IMPRESSION: Little  interval change with continued right mid lower lung airspace opacities and left lower lung atelectasis/ consolidation. Electronically Signed   By: Harmon PierJeffrey  Hu M.D.   On: 01/08/2016 08:03   STUDIES:  CT chest 3/23 no mass, pna  MICROBIOLOGY: MRSA PCR 3/19:  Negative Sputum 7/28>>>SERRATIA MARCESCENS  ANTIBIOTICS: Clindamycin post procedure 3/27 cefepime >> 3/28 vanc >>  SIGNIFICANT EVENTS: 3/19 - R Hip Repair 3/27 increased FIO2/PEEP, bicarb gtt for acidosis  LINES/TUBES: OETT 7.5 3/24>>> L IJ CVL 3/24>>> OGT 3/24>>> Foley 3/24>>> PIV  ASSESSMENT / PLAN:  PULMONARY A: Acute Hypoxic Respiratory Failure H/O Severe COPD ARDS 3/27 ? New HCAP P:   Terminal extubation today.  CARDIOVASCULAR A:  Hypotension - Resolved 3/24 but back on pressors 3/26? Secondary to sedation Paroxysmal Atrial Fibrillation - RVR resolved after intubation but back 3/26 H/O HTN  P:  D/C neo.  RENAL A:   Acute on Chronic Renal Failure  H/O BPH Metab acidosis P:   D/C further blood draws.  GASTROINTESTINAL A:   Umbilical hernia  P:   D/C TF.  HEMATOLOGIC A:   ABLA s/p surgery - Hgb Stable. Leukocytosis Coagulopathy - Chronic anticoagulation w/ Coumadin for a-fib.  P:  D/C further blood draws.  INFECTIOUS A:   HCAP 3/27 with SERRATIA MARCESCENS  P:   D/C abx.  ENDOCRINE A:   Hypothyroidism - TSH low. Hyperglycemia - Likely secondary to steroids.  P:   D/C synthroid and CBGs.  NEUROLOGIC A:   Pain Control - Post hip surgery 3/19. Sedation on Ventilator   P:   Fentanyl and ativan drip for comfort.  FAMILY UPDATES:  Spoke with daughter, will withdraw ETT today and proceed with comfort care.  The patient is critically ill with multiple organ systems failure and requires high complexity decision making for assessment and support, frequent evaluation and titration of therapies, application of advanced monitoring technologies and extensive interpretation of multiple  databases.   Critical Care Time devoted to patient care services described in this note is  35  Minutes. This time reflects time of care of this signee Dr Koren BoundWesam [name]. This critical care time does not reflect procedure time, or teaching time or supervisory time of PA/NP/Med student/Med Resident etc but could involve care discussion time.  Alyson ReedyWesam G. [name], M.D. [facility]. Pager: [phone]. After hours pager: [phone].  01/06/2016

## 2016-01-16 NOTE — Progress Notes (Signed)
CRITICAL VALUE ALERT  Critical value received:  hbg 6.6  Date of notification:  12/30/2015   Time of notification:  0615  Critical value read back:Yes.    Nurse who received alert: Everette RankJennifer Mahdi Frye, RN  MD notified (1st page):  Dr. Jamison NeighborNestor  Time of first page:  0620  MD notified (2nd page):  Time of second page:  Responding MD:  Dr. Jamison NeighborNestor  Time MD responded:  0620  MD aware.

## 2016-01-16 NOTE — Progress Notes (Signed)
Chaplain was called to support family at time of extubation. Spiritual conversation, prayer, support for daughter out-of-room during extubation.  Will be available to family if needed.  Rev. Audie BoxJan Hill [phone]

## 2016-01-16 DEATH — deceased

## 2016-01-20 ENCOUNTER — Ambulatory Visit: Payer: PPO | Admitting: Internal Medicine

## 2017-06-23 IMAGING — DX DG HIP (WITH OR WITHOUT PELVIS) 3-4V BILAT
6 series · 6 of 6 positions shown · non-contrast
Comparison: None.

CLINICAL DATA: Fall with right hip pain

EXAM:
DG HIP (WITH OR WITHOUT PELVIS) 3-4V BILAT

[t pelvis ap]
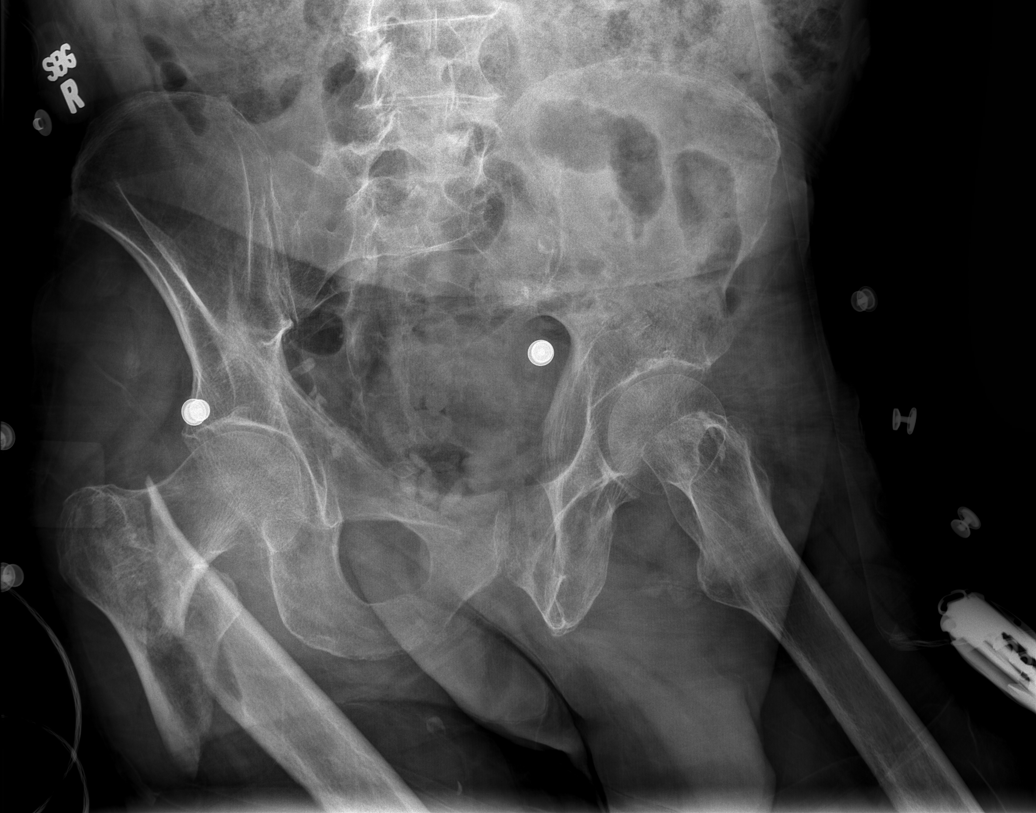

[t hip ap left]
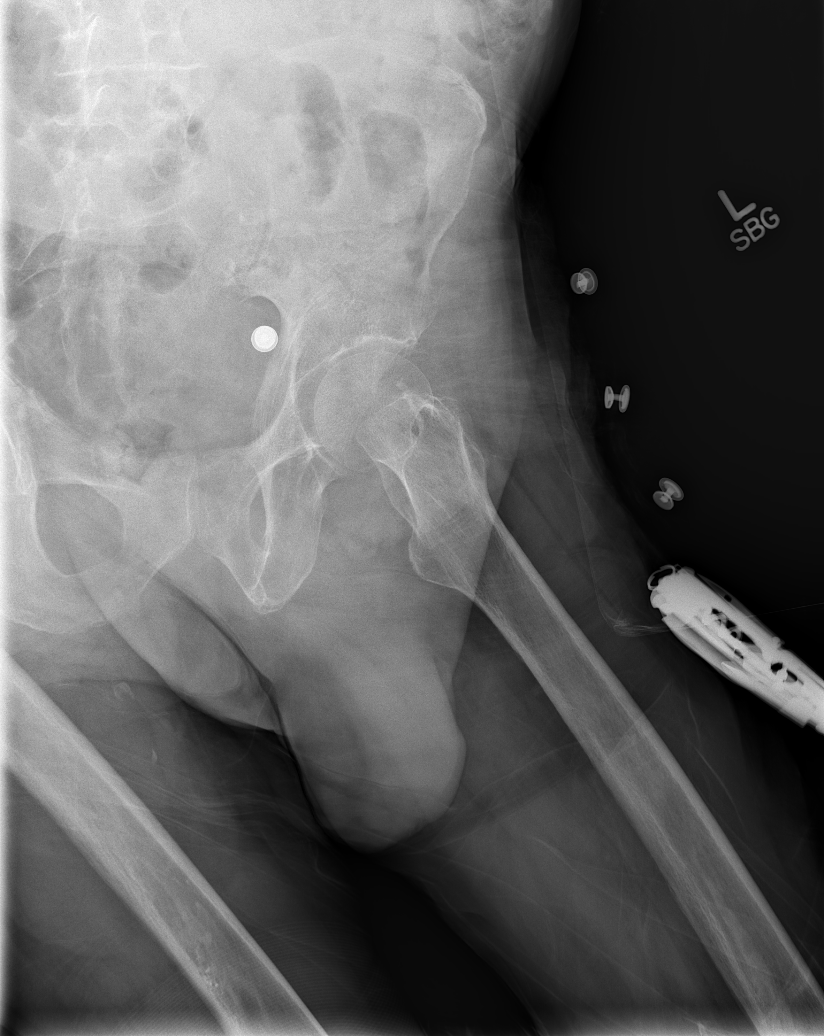

[t hip ap right]
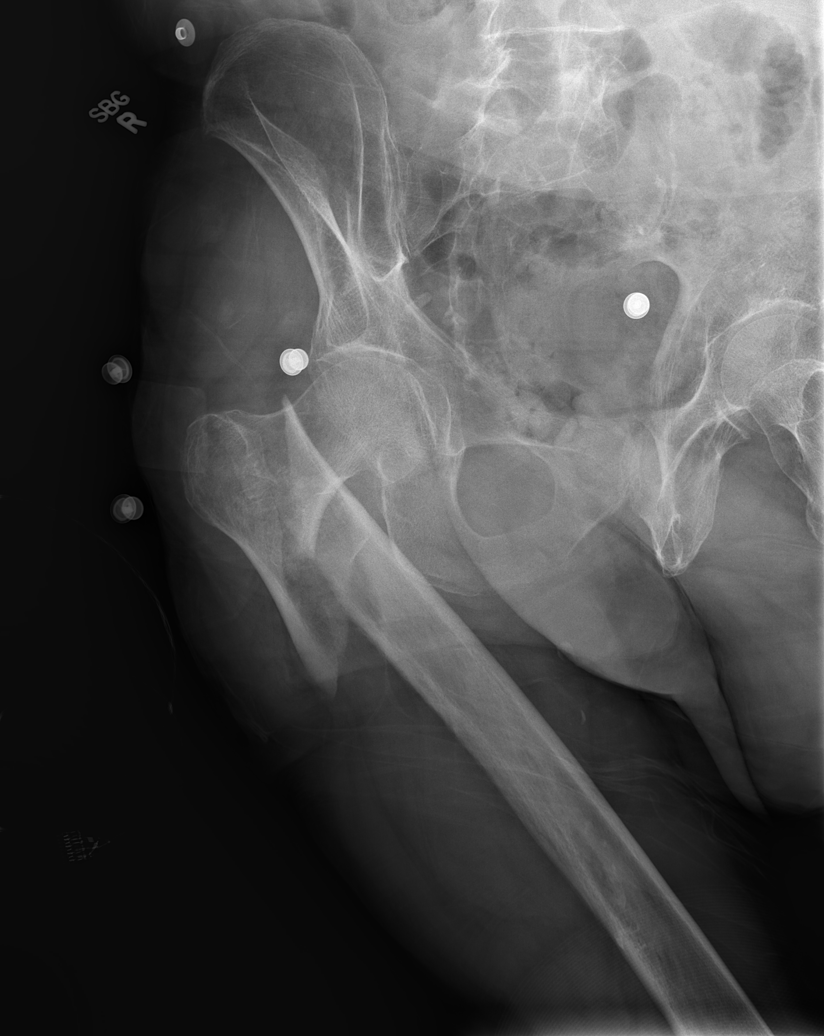

[w hip lat right]
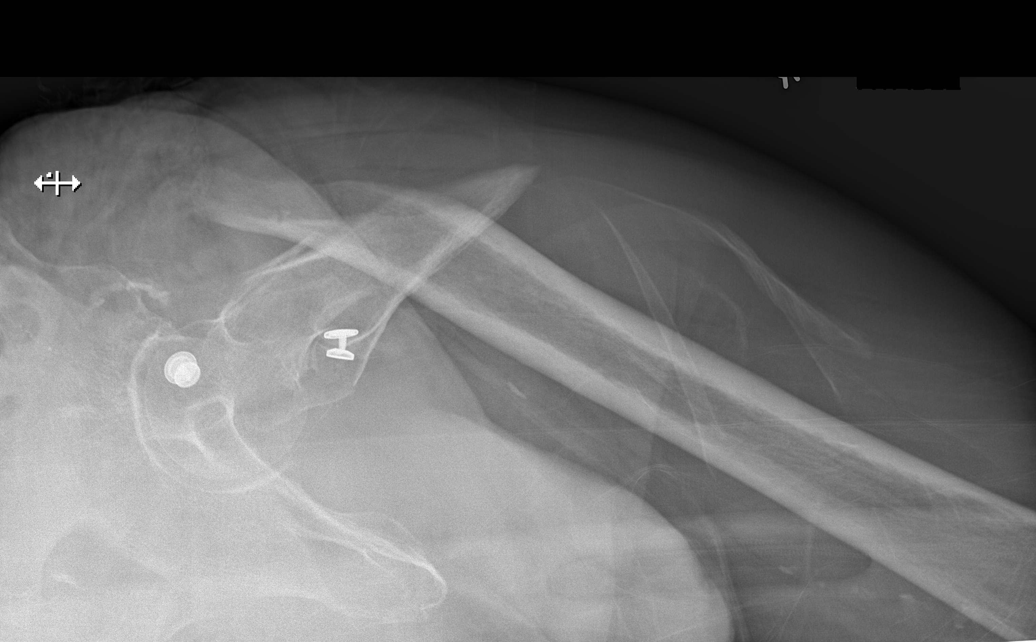

[w hip lat left (1 of 2)]
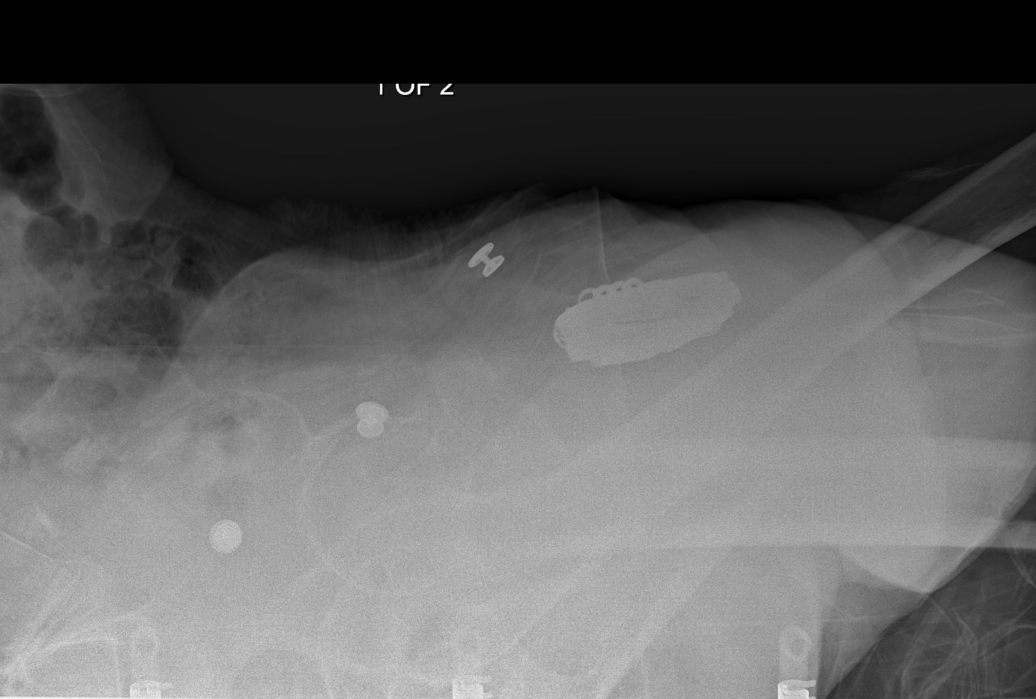

[w hip lat left (2 of 2)]
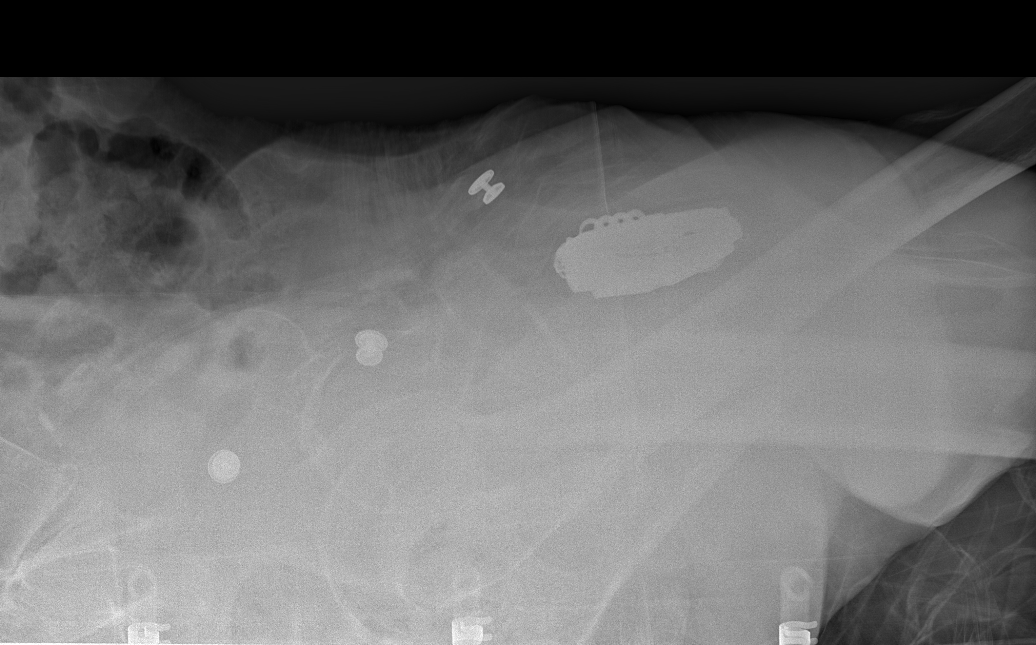

[6 of 6 positions shown; findings below may reference images not displayed]

FINDINGS: There is an oblique subtrochanteric fracture in the proximal right
femur, with prominent apex anterior angulation, 2.5 cm medial
displacement of the distal fracture fragment and 6 cm over riding of
the fracture fragments. No additional fracture is seen in the pelvis
or hips. No dislocation at the hip joints. No appreciable hip
arthropathy. No pelvic diastasis. Diffuse osteopenia. No suspicious
focal osseous lesions.
IMPRESSION: Oblique, displaced, overriding and angulated subtrochanteric right
proximal femur fracture.

Diffuse osteopenia.
